# Patient Record
Sex: Female | Born: 1960
Health system: Southern US, Community
[De-identification: ages and names within clinical notes are randomized; demographics above are authoritative.]

## PROBLEM LIST (undated history)

## (undated) DIAGNOSIS — Z9109 Other allergy status, other than to drugs and biological substances: Secondary | ICD-10-CM

## (undated) DIAGNOSIS — M109 Gout, unspecified: Secondary | ICD-10-CM

## (undated) DIAGNOSIS — M1990 Unspecified osteoarthritis, unspecified site: Secondary | ICD-10-CM

## (undated) DIAGNOSIS — T7840XA Allergy, unspecified, initial encounter: Secondary | ICD-10-CM

## (undated) DIAGNOSIS — I00 Rheumatic fever without heart involvement: Secondary | ICD-10-CM

## (undated) DIAGNOSIS — R7303 Prediabetes: Secondary | ICD-10-CM

## (undated) HISTORY — DX: Other allergy status, other than to drugs and biological substances: Z91.09

## (undated) HISTORY — DX: Rheumatic fever without heart involvement: I00

## (undated) HISTORY — DX: Allergy, unspecified, initial encounter: T78.40XA

## (undated) HISTORY — PX: OVARIAN CYST REMOVAL: SHX89

## (undated) HISTORY — PX: POLYPECTOMY: SHX149

---

## 1983-02-08 HISTORY — PX: TUBAL LIGATION: SHX77

## 2007-06-07 ENCOUNTER — Emergency Department (HOSPITAL_COMMUNITY): Admission: EM | Admit: 2007-06-07 | Discharge: 2007-06-07 | Payer: Self-pay | Admitting: Family Medicine

## 2010-02-28 ENCOUNTER — Encounter: Payer: Self-pay | Admitting: Obstetrics and Gynecology

## 2011-02-10 ENCOUNTER — Ambulatory Visit: Payer: Commercial Managed Care - PPO | Admitting: Emergency Medicine

## 2011-02-10 ENCOUNTER — Encounter: Payer: Self-pay | Admitting: Emergency Medicine

## 2011-02-24 ENCOUNTER — Ambulatory Visit (INDEPENDENT_AMBULATORY_CARE_PROVIDER_SITE_OTHER): Payer: 59 | Admitting: Emergency Medicine

## 2011-02-24 ENCOUNTER — Encounter: Payer: Self-pay | Admitting: Emergency Medicine

## 2011-02-24 ENCOUNTER — Other Ambulatory Visit (HOSPITAL_COMMUNITY)
Admission: RE | Admit: 2011-02-24 | Discharge: 2011-02-24 | Disposition: A | Discharge status: Home / Self Care | Payer: 59 | Source: Ambulatory Visit | Attending: Family Medicine | Admitting: Family Medicine

## 2011-02-24 VITALS — BP 128/80 | HR 90 | Ht 70.0 in | Wt 251.7 lb

## 2011-02-24 DIAGNOSIS — A499 Bacterial infection, unspecified: Secondary | ICD-10-CM

## 2011-02-24 DIAGNOSIS — B9689 Other specified bacterial agents as the cause of diseases classified elsewhere: Secondary | ICD-10-CM | POA: Insufficient documentation

## 2011-02-24 DIAGNOSIS — Z0000 Encounter for general adult medical examination without abnormal findings: Secondary | ICD-10-CM

## 2011-02-24 DIAGNOSIS — N760 Acute vaginitis: Secondary | ICD-10-CM

## 2011-02-24 DIAGNOSIS — Z124 Encounter for screening for malignant neoplasm of cervix: Secondary | ICD-10-CM

## 2011-02-24 DIAGNOSIS — N898 Other specified noninflammatory disorders of vagina: Secondary | ICD-10-CM

## 2011-02-24 DIAGNOSIS — Z01419 Encounter for gynecological examination (general) (routine) without abnormal findings: Secondary | ICD-10-CM | POA: Insufficient documentation

## 2011-02-24 LAB — POCT WET PREP (WET MOUNT): Trichomonas Wet Prep HPF POC: NEGATIVE

## 2011-02-24 MED ORDER — METRONIDAZOLE 500 MG PO TABS: 500.0000 mg | ORAL_TABLET | Freq: Two times a day (BID) | ORAL | Status: AC

## 2011-02-24 NOTE — Assessment & Plan Note (Signed)
Pap today.  Information on scheduling mammogram and colonoscopy given.  Will check fasting lipids.

## 2011-02-24 NOTE — Progress Notes (Signed)
  Subjective:    Patient ID: Mary Burton, female    DOB: 02-Sep-1960, 51 y.o.   MRN: [id number]  HPI Mary Burton is here today to establish care and have a pap smear done.  She has no acute concerns.  Does describe some watery vaginal discharge and would like GC/Chlamydia screening.  Sexually active with one partner, no condom use.  LMP 4 months ago.  I have reviewed and updated the following as appropriate: allergies, current medications, past family history, past medical history, past social history, past surgical history and problem list    Review of Systems Negative except as in HPI.    Objective:   Physical Exam Vitals: reviewed Gen: overweight woman, pleasant, cooperative, NAD HEENT: sclera white, EOMI, PERRL, MMM, no pharyngeal erythema or exudate Neck: no LAD CV: RRR, no murmurs Pulm: CTAB, no wheezes, rales Abd: soft, non-tender, non-distended Pelvic: external genitalia normal; vaginal normal; cervix normal; small amount of white discharge present; bimanual exam normal, no cervical motion tenderness. Skin: no rashes or lesions      Assessment & Plan:

## 2011-02-24 NOTE — Patient Instructions (Signed)
It was very nice to meet you!  We collected some labs today, I will send you a letter with the results or call you if something is wrong.  I have also put in an order for a cholesterol test.  Please make a lab appointment for next week.  DO NOT EAT OR DRINK ANYTHING FOR 8 HOURS BEFORE THIS BLOOD DRAW.  Please set up a screening mammogram and colonoscopy in the next few months.  I will see you back in 1 year for your annual exam or sooner as needed.

## 2011-02-24 NOTE — Assessment & Plan Note (Signed)
Will give Flagyl 500mg  BID x7 days.

## 2011-02-24 NOTE — Assessment & Plan Note (Signed)
Will screen for GC/Chlamydia and do a wet prep per patient's request.

## 2011-02-25 LAB — GC/CHLAMYDIA PROBE AMP, GENITAL: Chlamydia, DNA Probe: NEGATIVE

## 2011-02-28 ENCOUNTER — Encounter: Payer: Self-pay | Admitting: Emergency Medicine

## 2011-03-03 ENCOUNTER — Other Ambulatory Visit: Payer: 59

## 2011-03-03 DIAGNOSIS — Z0000 Encounter for general adult medical examination without abnormal findings: Secondary | ICD-10-CM

## 2011-03-03 LAB — LIPID PANEL
LDL Cholesterol: 77 mg/dL (ref 0–99)
Triglycerides: 39 mg/dL (ref <150)
VLDL: 8 mg/dL (ref 0–40)

## 2011-03-03 NOTE — Progress Notes (Signed)
flp done today Mary Burton 

## 2011-03-08 ENCOUNTER — Encounter: Payer: Self-pay | Admitting: Emergency Medicine

## 2011-05-03 ENCOUNTER — Ambulatory Visit (INDEPENDENT_AMBULATORY_CARE_PROVIDER_SITE_OTHER): Payer: 59 | Admitting: Family Medicine

## 2011-05-03 ENCOUNTER — Encounter: Payer: Self-pay | Admitting: Family Medicine

## 2011-05-03 VITALS — BP 122/84 | HR 82 | Temp 98.3°F | Ht 72.0 in | Wt 242.0 lb

## 2011-05-03 DIAGNOSIS — M79673 Pain in unspecified foot: Secondary | ICD-10-CM | POA: Insufficient documentation

## 2011-05-03 DIAGNOSIS — M79609 Pain in unspecified limb: Secondary | ICD-10-CM

## 2011-05-03 NOTE — Progress Notes (Signed)
  Subjective:    Patient ID: Mary Burton, female    DOB: May 23, 1960, 51 y.o.   MRN: [id number]  HPI  3 weeks of feet swelling and pain  Located on both feet,Left greater than right.  left foot with pain over the top of metatarsals.  Worst when she is sitting with her legs down and notes it is also more noticeable when waking in the morning.  Not worse with walking.   Bought a new pair of shoes which seemed to make it worse.  Is on her feet a lot in her job.  No new physical activity or injury.    Review of Systems No leg or calf pain or swelling. No dyspnea, cough, PND.      Objective:   Physical Exam  GEN: Alert & Oriented, No acute distress CV:  Regular Rate & Rhythm, no murmur Respiratory:  Normal work of breathing, CTAB Ext: no pre-tibial edema, calf pain, or varicosities. Feet:  No edema noted.  Not painful on palpation today- no pain on dorsum of foot where she notes pain and swelling is located. Shoes too small.      Assessment & Plan:

## 2011-05-03 NOTE — Patient Instructions (Signed)
I think a good pair of shoes that are properly fitted will relieve your foot pain  See info for store to get fitted

## 2011-05-03 NOTE — Assessment & Plan Note (Signed)
New.  No evidence of swelling and history not suggestive of dependent edema or claudication.  Most likely dorsal foot pain with sensation of swelling due to poorly fitting shoes.  advised to go to running shoe store to get fitted for supportive shoes that fit properly.  If continues to have pain, would refer to sports medicine for further evaluation.

## 2011-05-31 ENCOUNTER — Ambulatory Visit: Payer: 59 | Admitting: Emergency Medicine

## 2011-06-06 ENCOUNTER — Encounter: Payer: Self-pay | Admitting: Emergency Medicine

## 2011-06-06 ENCOUNTER — Ambulatory Visit (INDEPENDENT_AMBULATORY_CARE_PROVIDER_SITE_OTHER): Payer: 59 | Admitting: Emergency Medicine

## 2011-06-06 VITALS — BP 127/90 | HR 96 | Ht 71.0 in | Wt 248.0 lb

## 2011-06-06 DIAGNOSIS — M25562 Pain in left knee: Secondary | ICD-10-CM

## 2011-06-06 DIAGNOSIS — M25561 Pain in right knee: Secondary | ICD-10-CM

## 2011-06-06 DIAGNOSIS — J309 Allergic rhinitis, unspecified: Secondary | ICD-10-CM

## 2011-06-06 DIAGNOSIS — M25569 Pain in unspecified knee: Secondary | ICD-10-CM

## 2011-06-06 DIAGNOSIS — J302 Other seasonal allergic rhinitis: Secondary | ICD-10-CM

## 2011-06-06 DIAGNOSIS — M7989 Other specified soft tissue disorders: Secondary | ICD-10-CM

## 2011-06-06 HISTORY — DX: Pain in right knee: M25.561

## 2011-06-06 MED ORDER — FLUTICASONE PROPIONATE 50 MCG/ACT NA SUSP: 2.0000 | Freq: Every day | NASAL | Status: DC

## 2011-06-06 NOTE — Assessment & Plan Note (Signed)
Likely a combination of increased salt intake and venous insufficiency.  Will check BMP to assess kidney function.  No evidence for renal or cardiac causes.  If creatinine okay, will give lasix 20mg  once daily as needed.  Discussed avoiding salty foods.  Also discussed weight loss to help with venous insufficiency.  If swelling persists or develops skin changes, she is to return.  Did discuss the possibility of needing compression stockings in the future.

## 2011-06-06 NOTE — Patient Instructions (Signed)
It was nice to see you!  For your feet... - this is likely caused by too much salt intake and venous insufficiency - we are going to check some labs - if the labs are okay, I will send some lasix to your pharmacy.  You can take this one a day as needed for swelling in your feet.  For you knees... - this is probably arthritis - you can try tylenol and motrin for pain - you can also try an arthritis pill with chondroitin and/or glucosamine  - weight loss with also help (cut out the potato chips and salty food and focus more on vegetables and fruits) - if your knee pain continues, we can try a joint injection  Come see me if your swelling gets worse (or no longer improves), any skin changes in your ankles, or your knee pain is getting worse.

## 2011-06-06 NOTE — Assessment & Plan Note (Signed)
Taking zyrtec at home.  Has a lot on nasal symptoms.  Been on flonase in the past with good results.  Will give trial of flonase.

## 2011-06-06 NOTE — Progress Notes (Signed)
  Subjective:    Patient ID: Mary Burton, female    DOB: 1960-03-06, 51 y.o.   MRN: [id number]  HPI Mary Burton is here for bilateral foot swelling.  1. Bilateral foot swelling: Going on for about 2 months.  Intermittent.  Worse with high salt intake.  Elevation helps some.  Associated with some dorsal foot pain.  Does have some increased urination.  No chest pain, palpitations, orthopnea, dyspnea.   2. Bilateral knee pain: R worse than L.  Ongoing.  Worse with activity.  Tylenol and motrin are not helping.  Asking about "arthritis" pills.   I have reviewed and updated the following as appropriate: allergies and current medications   Review of Systems See HPI    Objective:   Physical Exam BP 127/90  Pulse 96  Ht 5\' 11"  (1.803 m)  Wt 248 lb (112.492 kg)  BMI 34.59 kg/m2 Gen: alert, NAD HEENT: AT/[place], sclera white, MMM Neck: no JVD CV: RRR, no murmurs Ext: palpable DP pulses bilaterally, trace edema to mid-shin; bilaterally knees with TTP at anterior medial and lateral joint lines, no crepitus Skin: no skin changes of the ankles     Assessment & Plan:

## 2011-06-06 NOTE — Assessment & Plan Note (Signed)
Likely osteoarthritis.  Patient enthusiastic about trying to lose some weight.  Tylenol and motrin not helping.  Patient interested in trying an "arthritis" pill, instructed her to look for one with chondroitin and/or glucosamine.  Discussed knee injections, if medications/weight loss not helping.

## 2011-06-07 ENCOUNTER — Telehealth: Payer: Self-pay | Admitting: Emergency Medicine

## 2011-06-07 LAB — BASIC METABOLIC PANEL
Glucose, Bld: 94 mg/dL (ref 70–99)
Potassium: 3.9 mEq/L (ref 3.5–5.3)
Sodium: 138 mEq/L (ref 135–145)

## 2011-06-07 MED ORDER — FUROSEMIDE 20 MG PO TABS: 20.0000 mg | ORAL_TABLET | Freq: Every day | ORAL | Status: DC | PRN

## 2011-06-07 NOTE — Telephone Encounter (Signed)
Called and spoke with patient regarding lab results and lasix prescription (prn for pedal edema).

## 2011-10-07 ENCOUNTER — Ambulatory Visit (AMBULATORY_SURGERY_CENTER): Payer: 59

## 2011-10-07 VITALS — Ht 72.0 in | Wt 233.7 lb

## 2011-10-07 DIAGNOSIS — Z1211 Encounter for screening for malignant neoplasm of colon: Secondary | ICD-10-CM

## 2011-10-12 ENCOUNTER — Encounter: Payer: Self-pay | Admitting: Internal Medicine

## 2011-10-12 ENCOUNTER — Ambulatory Visit (AMBULATORY_SURGERY_CENTER): Payer: 59 | Admitting: Internal Medicine

## 2011-10-12 VITALS — BP 140/81 | HR 86 | Temp 98.2°F | Resp 18 | Ht 72.0 in | Wt 233.0 lb

## 2011-10-12 DIAGNOSIS — K635 Polyp of colon: Secondary | ICD-10-CM

## 2011-10-12 DIAGNOSIS — D126 Benign neoplasm of colon, unspecified: Secondary | ICD-10-CM

## 2011-10-12 DIAGNOSIS — Z800 Family history of malignant neoplasm of digestive organs: Secondary | ICD-10-CM

## 2011-10-12 DIAGNOSIS — Z1211 Encounter for screening for malignant neoplasm of colon: Secondary | ICD-10-CM

## 2011-10-12 HISTORY — PX: COLONOSCOPY: SHX174

## 2011-10-12 MED ORDER — SODIUM CHLORIDE 0.9 % IV SOLN: 500.0000 mL | INTRAVENOUS | Status: DC

## 2011-10-12 NOTE — Patient Instructions (Addendum)

## 2011-10-12 NOTE — Op Note (Signed)
[facility] [address] [place], [address]   COLONOSCOPY PROCEDURE REPORT  PATIENT: Mary, Burton  MR#: [id number] BIRTHDATE: 1960/02/21 , 51  yrs. old GENDER: Female ENDOSCOPIST: Beverley Fiedler, MD REFERRED XB:JYNW Elwyn Reach, MD PROCEDURE DATE:  10/12/2011 PROCEDURE:   Colonoscopy with cold biopsy polypectomy ASA CLASS:   Class I INDICATIONS:elevated risk screening, patient's immediate family history of colon cancer (mother), and first colonoscopy. MEDICATIONS: MAC sedation, administered by CRNA and Propofol (Diprivan) 230 mg IV  DESCRIPTION OF PROCEDURE:   After the risks benefits and alternatives of the procedure were thoroughly explained, informed consent was obtained.  A digital rectal exam revealed no rectal mass.   The LB [id number]  endoscope was introduced through the anus and advanced to the terminal ileum which was intubated for a short distance. No adverse events experienced.   The quality of the prep was Suprep good  The instrument was then slowly withdrawn as the colon was fully examined.   COLON FINDINGS: The mucosa appeared normal in the terminal ileum. Moderate melanosis was found throughout the entire examined colon. A sessile polyp measuring 4 mm in size was found in the transverse colon.  A polypectomy was performed with cold forceps.  The resection was complete and the polyp tissue was completely retrieved.  Retroflexed views revealed no abnormalities. The time to cecum=5 minutes 36 seconds.  Withdrawal time=11 minutes 16 seconds.  The scope was withdrawn and the procedure completed. COMPLICATIONS: There were no complications.  ENDOSCOPIC IMPRESSION: 1.   Normal mucosa in the terminal ileum 2.   Moderate melanosis was found throughout the entire examined colon 3.   Sessile polyp measuring 4 mm in size was found in the transverse colon; polypectomy was performed with cold forceps  RECOMMENDATIONS: 1.  await pathology  results 2.  Given your significant family history of colon cancer, you should have a repeat colonoscopy in 5 years 3.  You will receive a letter within 1-2 weeks with the results of your biopsy as well as final recommendations.  Please call my office if you have not received a letter after 3 weeks.   eSigned:  Beverley Fiedler, MD 10/12/2011 11:06 AMn revised  cc: Despina Hick MD and The Patient

## 2011-10-12 NOTE — Progress Notes (Signed)
Patient did not experience any of the following events: a burn prior to discharge; a fall within the facility; wrong site/side/patient/procedure/implant event; or a hospital transfer or hospital admission upon discharge from the facility. (G8907) Patient did not have preoperative order for IV antibiotic SSI prophylaxis. (G8918)  

## 2011-10-13 ENCOUNTER — Telehealth: Payer: Self-pay | Admitting: *Deleted

## 2011-10-13 NOTE — Telephone Encounter (Signed)
  Follow up Call-  Call back number 10/12/2011  Post procedure Call Back phone  # [phone]  Permission to leave phone message Yes     Left message.

## 2011-10-17 ENCOUNTER — Encounter: Payer: Self-pay | Admitting: Internal Medicine

## 2011-11-21 ENCOUNTER — Ambulatory Visit (INDEPENDENT_AMBULATORY_CARE_PROVIDER_SITE_OTHER): Payer: 59 | Admitting: Family Medicine

## 2011-11-21 ENCOUNTER — Encounter: Payer: Self-pay | Admitting: Family Medicine

## 2011-11-21 VITALS — BP 130/85 | HR 85 | Wt 236.6 lb

## 2011-11-21 DIAGNOSIS — M79672 Pain in left foot: Secondary | ICD-10-CM | POA: Insufficient documentation

## 2011-11-21 DIAGNOSIS — M79609 Pain in unspecified limb: Secondary | ICD-10-CM

## 2011-11-21 MED ORDER — DICLOFENAC SODIUM 1 % TD GEL: 4.0000 g | Freq: Four times a day (QID) | TRANSDERMAL | Status: DC

## 2011-11-21 NOTE — Progress Notes (Signed)
S: Pt comes in today for SDA for foot swelling x 2 months.  Has had problems with this off and on for multiple months.  Previously tried changing shoes, which didn't help (went to ortho place in [place]).  Lasix also did not help.  Swelling gets worse with increase salt intake.  Pain in feet, esp at night.  Feels like needles are sticking in her feet, no numbness.  Stands a lot during the day.  Elevating feet causes pain (throbbing), has not been able to successfully do this to see if it helps because of the pain.  Will take Aleve, which doesn't help. Has never tried compression stockings because they are too tight (tried a pair of her mom's).    Left foot is worse than right, but starting to have some swelling in right.  Precepted with Dr. Katrinka Blazing due to sports medicine nature of pain/exam   ROS: Per HPI  History  Smoking status  . Never Smoker   Smokeless tobacco  . Never Used    O:  Filed Vitals:   11/21/11 0858  BP: 130/85  Pulse: 85    Gen: NAD Ext: Warm, mild edema, L foot > R foot; no tenderness noted on right foot; + TTP over dorsal aspect of left foot, navicular and cuboid hypertrophy on left; supinates L foot with standing, pronates R foot   A/P: 51 y.o. female p/w dorsal foot swelling and tenderness on L -See problem list -f/u in [facility]

## 2011-11-21 NOTE — Assessment & Plan Note (Signed)
Precepted with Dr. Katrinka Blazing.  Will try arch support and voltaren gel. Send to [facility] for further evaluation, including possible U/S to r/o fracture.  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

## 2011-11-21 NOTE — Patient Instructions (Signed)
It was nice to meet you today.  I am sending in a prescription for a gel to put on your foot.  This should help with the pain.  I am also giving you an arch brace-- see if this helps at all.  If it causes more pain, you don't have to use it.  I want you to follow up at the Sports Medicine clinic in the next few weeks.  You can schedule that appt here or by calling them ([phone]).

## 2011-11-24 ENCOUNTER — Telehealth: Payer: Self-pay | Admitting: Emergency Medicine

## 2011-11-24 NOTE — Telephone Encounter (Signed)
Patient is calling because the problem with her feet is getting worse and she would like a recommendation for a Podiatrist.  Patient doesn't know if she would be able to get in faster if a referral came from [facility] or if she just needs to call a Podiatrist on her own.  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  She would like this to happen as soon as possible.

## 2011-11-24 NOTE — Telephone Encounter (Signed)
Called pt and she said, that she is frustrated because her feet are worse than ever. Medication was very expensive ($40) and did not help. She said, that she will schedule her own appointment. I told the pt that I am sorry, that we could not help her and I will let Dr.McGill know. Pt agreed. Mary Burton, Renato Battles

## 2011-11-24 NOTE — Telephone Encounter (Signed)
Pt is supposed to f/u at Sports Medicine per Dr. Katrinka Blazing whom I precepted to.

## 2012-05-08 ENCOUNTER — Telehealth: Payer: Self-pay | Admitting: Emergency Medicine

## 2012-05-08 NOTE — Telephone Encounter (Signed)
Returned call to patient.  Had diarrhea episode last week and it went away.  Symptoms returned last night with vomiting and diarrhea.  Works at [employer] and employees have similar symptom.  Discussed good hand hygiene, BRAT diet, and hydration with patient.  Patient will try home care measures first and call for appt tomorrow if not better.  Gaylene Brooks, RN

## 2012-05-08 NOTE — Telephone Encounter (Signed)
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

## 2012-05-31 ENCOUNTER — Ambulatory Visit: Payer: Self-pay

## 2012-05-31 ENCOUNTER — Ambulatory Visit (INDEPENDENT_AMBULATORY_CARE_PROVIDER_SITE_OTHER): Payer: 59 | Admitting: Family Medicine

## 2012-05-31 ENCOUNTER — Ambulatory Visit: Payer: 59

## 2012-05-31 DIAGNOSIS — M79609 Pain in unspecified limb: Secondary | ICD-10-CM

## 2012-05-31 DIAGNOSIS — M25473 Effusion, unspecified ankle: Secondary | ICD-10-CM

## 2012-05-31 DIAGNOSIS — M25475 Effusion, left foot: Secondary | ICD-10-CM

## 2012-05-31 MED ORDER — PREDNISONE 20 MG PO TABS: ORAL_TABLET | ORAL | Status: DC

## 2012-05-31 MED ORDER — TRAMADOL HCL 50 MG PO TABS: 50.0000 mg | ORAL_TABLET | Freq: Four times a day (QID) | ORAL | Status: DC | PRN

## 2012-05-31 NOTE — Progress Notes (Signed)
[address]   [place], [place]  [address]   [phone]  Subjective:    Patient ID: Mary Burton, female    DOB: Jul 03, 1960, 52 y.o.   MRN: [id number]  HPI This 52 y.o. female presents for evaluation of L foot knot.  Onset for quite a while; first visit at [facility] 04/2011.   S/p podiatry consult two months ago; s/p aspiration of fluid from dorsal aspect of foot; swelling returned.  Has been out of work due to pain, inability to walk.  Also underwent evaluation by [facility]; recommended buying new shoes in 04/2011; returned [facility]; unsure of etiology/diagnosis; prescribed foot cream rx (Voltaren gel); no improvement; returned for third visit; saw different provider; gave brace which was so painful; recommended evaluation in Sports Medicine Clinic but has not returned.  Foot is throbbing.  No referral to specialist.  Throbs like a tooth ache.  Podiatrist underwent xray; diagnosed with arthritis.  Nighttime awakening. Behavioral [facility].  +tingling in foot. Took out of work for one week without much improvement.   Feels like needles sticking in it.  Gets very stiff after prolonged sitting.  Certain foods make pain worse; [place] food, pizza makes pain worse.   No redness to skin; no warmth to joint.     Review of Systems  Constitutional: Negative for chills, diaphoresis and fatigue.  Musculoskeletal: Positive for joint swelling, arthralgias and gait problem.  Neurological: Positive for numbness. Negative for weakness.        Past Medical History  Diagnosis Date  . Environmental allergies   . Allergy     Past Surgical History  Procedure Laterality Date  . Tubal ligation  1985  . Ovarian cyst removal      Prior to Admission medications   Medication Sig Start Date End Date Taking? Authorizing Provider  diclofenac sodium (VOLTAREN) 1 % GEL Apply 4 g topically 4 (four) times daily. 11/21/11   Jacquelyn A McGill, MD  fluticasone  (FLONASE) 50 MCG/ACT nasal spray Place 2 sprays into the nose daily. 06/06/11 06/05/12  Phebe Colla, MD  furosemide (LASIX) 20 MG tablet Take 1 tablet (20 mg total) by mouth daily as needed. For foot swelling 06/07/11 06/06/12  Phebe Colla, MD    No Known Allergies  History   Social History  . Marital Status: Single    Spouse Name: N/A    Number of Children: N/A  . Years of Education: N/A   Occupational History  . Not on file.   Social History Main Topics  . Smoking status: Never Smoker   . Smokeless tobacco: Never Used  . Alcohol Use: No  . Drug Use: No  . Sexually Active: Yes    Birth Control/ Protection: None   Other Topics Concern  . Not on file   Social History Narrative  . No narrative on file    Family History  Problem Relation Age of Onset  . Cancer Mother     colon cancer  . Colon cancer Mother   . Hypertension Father     Objective:   Physical Exam  Nursing note and vitals reviewed. Constitutional: She is oriented to person, place, and time. She appears well-developed and well-nourished. No distress.  Cardiovascular: Intact distal pulses.   Pulses:      Dorsalis pedis pulses are 2+ on the right side, and 2+ on the left side.  Capillary refill< 3 seconds.  Musculoskeletal:  Left ankle: She exhibits normal range of motion, no swelling, no ecchymosis, no deformity and no laceration. No tenderness. No lateral malleolus and no medial malleolus tenderness found. Achilles tendon normal. Achilles tendon exhibits no pain, no defect and normal Thompson's test results.       Left foot: She exhibits decreased range of motion, tenderness, bony tenderness and swelling. She exhibits normal capillary refill, no crepitus, no deformity and no laceration.  L FOOT:  HYPERTROPHY OF DORSAL ASPECT OF FOOT; NO SWELLING; METATARSALS NON-TENDER.  NO WARMTH OR ERYTHEMA; PAIN WITH DORSIFLEXION OF ANKLE/FOOT.  Neurological: She is alert and oriented to person, place, and time.    Skin: She is not diaphoretic.    [facility] reading (PRIMARY) by  Dr. Katrinka Blazing. L FOOT:  +spurring scattered dorsal aspect of foot.        Assessment & Plan:  Pain, foot, left - Plan: DG Foot Complete Left, traMADol (ULTRAM) 50 MG tablet  Swelling of foot joint, left - Plan: DG Foot Complete Left, predniSONE (DELTASONE) 20 MG tablet, Ambulatory referral to Podiatry   1.  Pain L foot:  New.  Rx fro Prednisone, Ultram provided.   2.  Swelling Dorsal Aspect L foot:  New.  S/p podiatry consultation with aspiration; s/p family medicine appointment recently; onset one year ago.  Rx for Prednisone provided; rx for Ultram provided for pain; post-op shoe fitted for comfort; refer to podiatry.   3. OA L foot:  New. Likely etiology to current symptoms.  No suggestion of acute gouty attack.   Meds ordered this encounter  Medications  . predniSONE (DELTASONE) 20 MG tablet    Sig: Three tablets daily x 1 day, then two daily x 5 days then one daily x 5 days    Dispense:  18 tablet    Refill:  0  . traMADol (ULTRAM) 50 MG tablet    Sig: Take 1 tablet (50 mg total) by mouth every 6 (six) hours as needed for pain.    Dispense:  40 tablet    Refill:  0

## 2012-06-01 ENCOUNTER — Telehealth: Payer: Self-pay

## 2012-06-01 NOTE — Telephone Encounter (Signed)
Patient was in yesterday with complaints of foot pain. She is calling today asking for a prescription for her allergies. Claritin does not work for her. She would like something stronger. Advised her to call her PCP since we have not seen her for this complaint. She states that she would like to Dr. Katrinka Blazing to send the Rx since she has not been to any other dr in "a long while". Please advise.

## 2012-06-01 NOTE — Telephone Encounter (Signed)
Pt was seen yesterday by. Dr. Katrinka Blazing and she was wondering if she could be put on some Clartin for her allergies Call back number is [phone]

## 2012-06-04 NOTE — Telephone Encounter (Signed)
Thanks, have advised her to come in for this.

## 2012-06-04 NOTE — Telephone Encounter (Signed)
Call --- unfortunately, we did not discuss her allergy symptoms at all during her visit, thus, I am not able to treat her for allergies without a visit.  Looks like Despina Hick, MD has sent in rx for Flonase for her in 2013; would call office for refill.

## 2012-08-08 ENCOUNTER — Telehealth: Payer: Self-pay

## 2012-08-08 NOTE — Telephone Encounter (Signed)
DR.SMITH, PT WOULD LIKE FOR YOU TO CALL IN NAPROXEN FOR HER SHE STATES THAT THAT RX HELPED WITH HER FOOT ISSUE. BEST#[phone] PHARMACY: [facility]

## 2012-08-08 NOTE — Telephone Encounter (Signed)
Call --- I did not prescribe her Naproxen at her visit in 05/2012; I prescribed her Prednisone.  Who prescribed her Naproxen?

## 2012-08-10 NOTE — Telephone Encounter (Signed)
Tried to rtn pt call- unable to leave message.

## 2012-08-13 NOTE — Telephone Encounter (Signed)
Called patient. Advised if she does not call back, will disregard the message.

## 2012-08-14 ENCOUNTER — Telehealth: Payer: Self-pay

## 2012-08-14 NOTE — Telephone Encounter (Signed)
Pt is returning Amy's call regarding Naproxen, she states that she tried her fathers rx for naproxen and that helped her with the pain.  °Best# [phone] (work  ° ° °

## 2012-08-14 NOTE — Telephone Encounter (Signed)
Pt is returning Amy's call regarding Naproxen, she states that she tried her fathers rx for naproxen and that helped her with the pain.  Best# [phone] (work

## 2012-08-15 MED ORDER — NAPROXEN 500 MG PO TABS: 500.0000 mg | ORAL_TABLET | Freq: Two times a day (BID) | ORAL | Status: DC

## 2012-08-15 NOTE — Telephone Encounter (Signed)
You wanted to know who had given her the Naproxen (you treated with prednisone), see note below

## 2012-08-15 NOTE — Telephone Encounter (Signed)
I sent Naproxen rx to pharmacy.  Please advise patient.

## 2012-08-16 ENCOUNTER — Telehealth: Payer: Self-pay | Admitting: Radiology

## 2012-08-16 NOTE — Telephone Encounter (Signed)
Called at work to advise med sent in, patient never picked up, left message on home #. Mary Burton

## 2012-08-16 NOTE — Telephone Encounter (Signed)
Pt advised.

## 2012-12-05 ENCOUNTER — Telehealth: Payer: Self-pay

## 2012-12-05 NOTE — Telephone Encounter (Signed)
Will fwd to PCP.  Enaya Howze L, CMA  

## 2012-12-05 NOTE — Telephone Encounter (Signed)
Patient calls stating that she will need a referral to [facility] at [facility]. Misty Stanley, RN at [facility] examined patient's extremity swelling (been seen here 3 times for this reason) and is concerned about something wrong with patient's heart. Please call patient once referral has been placed.

## 2012-12-06 NOTE — Telephone Encounter (Signed)
She will need to schedule a visit with me prior to referral as I have never seen her before and it has been over 1 year since she has been seen in this office.

## 2012-12-06 NOTE — Telephone Encounter (Signed)
Instructed patient, see message below.  Tatym Schermer, Darlyne Russian, CMA

## 2012-12-11 ENCOUNTER — Ambulatory Visit (INDEPENDENT_AMBULATORY_CARE_PROVIDER_SITE_OTHER): Payer: 59 | Admitting: Family Medicine

## 2012-12-11 ENCOUNTER — Encounter: Payer: Self-pay | Admitting: Family Medicine

## 2012-12-11 VITALS — BP 128/78 | HR 72 | Ht 71.0 in | Wt 239.9 lb

## 2012-12-11 DIAGNOSIS — M7989 Other specified soft tissue disorders: Secondary | ICD-10-CM

## 2012-12-11 DIAGNOSIS — M79609 Pain in unspecified limb: Secondary | ICD-10-CM

## 2012-12-11 DIAGNOSIS — M79673 Pain in unspecified foot: Secondary | ICD-10-CM

## 2012-12-11 LAB — COMPLETE METABOLIC PANEL WITH GFR
AST: 14 U/L (ref 0–37)
Albumin: 4 g/dL (ref 3.5–5.2)
Alkaline Phosphatase: 52 U/L (ref 39–117)
BUN: 13 mg/dL (ref 6–23)
Calcium: 9.5 mg/dL (ref 8.4–10.5)
Chloride: 104 mEq/L (ref 96–112)
Potassium: 3.9 mEq/L (ref 3.5–5.3)
Sodium: 139 mEq/L (ref 135–145)
Total Protein: 7.4 g/dL (ref 6.0–8.3)

## 2012-12-11 NOTE — Patient Instructions (Signed)
Venous Stasis and Chronic Venous Insufficiency  As people age, the veins located in their legs may weaken and stretch. When veins weaken and lose the ability to pump blood effectively, the condition is called chronic venous insufficiency (CVI) or venous stasis. Almost all veins return blood back to the heart. This happens by:   The force of the heart pumping fresh blood pushes blood back to the heart.   Blood flowing to the heart from the force of gravity.  In the deep veins of the legs, blood has to fight gravity and flow upstream back to the heart. Here, the leg muscles contract to pump blood back toward the heart. Vein walls are elastic, and many veins have small valves that only allow blood to flow in one direction. When leg muscles contract, they push inward against the elastic vein walls. This squeezes blood upward, opens the valves, and moves blood toward the heart. When leg muscles relax, the vein wall also relaxes and the valves inside the vein close to prevent blood from flowing backward. This method of pumping blood out of the legs is called the venous pump.  CAUSES   The venous pump works best while walking and leg muscles are contracting. But when a person sits or stands, blood pressure in leg veins can build. Deep veins are usually able to withstand short periods of inactivity, but long periods of inactivity (and increased pressure) can stretch, weaken, and damage vein walls. High blood pressure can also stretch and damage vein walls. The veins may no longer be able to pump blood back to the heart. Venous hypertension (high blood pressure inside veins) that lasts over time is a primary cause of CVI. CVI can also be caused by:    Deep vein thrombosis, a condition where a thrombus (blood clot) blocks blood flow in a vein.   Phlebitis, an inflammation of a superficial vein that causes a blood clot to form.  Other risk factors for CVI may include:    Heredity.   Obesity.   Pregnancy.    Sedentary lifestyle.   Smoking.   Jobs requiring long periods of standing or sitting in one place.   Age and gender:   Women in their 40's and 50's and men in their 70's are more prone to developing CVI.  SYMPTOMS   Symptoms of CVI may include:    Varicose veins.   Ulceration or skin breakdown.   Lipodermatosclerosis, a condition that affects the skin just above the ankle, usually on the inside surface. Over time the skin becomes brown, smooth, tight and often painful. Those with this condition have a high risk of developing skin ulcers.   Reddened or discolored skin on the leg.   Swelling.  DIAGNOSIS   Your caregiver can diagnose CVI after performing a careful medical history and physical examination. To confirm the diagnosis, the following tests may also be ordered:    Duplex ultrasound.   Plethysmography (tests blood flow).   Venograms (x-ray using a special dye).  TREATMENT  The goals of treatment for CVI are to restore a person to an active life and to minimize pain or disability. Typically, CVI does not pose a serious threat to life or limb, and with proper treatment most people with this condition can continue to lead active lives. In most cases, mild CVI can be treated on an outpatient basis with simple procedures. Treatment methods include:    Elastic compression socks.   Sclerotherapy, a procedure involving an injection of   a material that "dissolves" the damaged veins. Other veins in the network of blood vessels take over the function of the damaged veins.   Vein stripping (an older procedure less commonly used).   Laser Ablation surgery.   Valve repair.  HOME CARE INSTRUCTIONS    Elastic compression socks must be worn every day. They can help with symptoms and lower the chances of the problem getting worse, but they do not cure the problem.   Only take over-the-counter or prescription medicines for pain, discomfort, or fever as directed by your caregiver.    Your caregiver will review your other medications with you.  SEEK MEDICAL CARE IF:    You are confused about how to take your medications.   There is redness, swelling, or increasing pain in the affected area.   There is a red streak or line that extends up or down from the affected area.   There is a breakdown or loss of skin in the affected area, even if the breakdown is small.   You develop an unexplained oral temperature above 102 F (38.9 C).   There is an injury to the affected area.  SEEK IMMEDIATE MEDICAL CARE IF:    There is an injury and open wound to the affected area.   Pain is not adequately relieved with pain medication prescribed or becomes severe.   An oral temperature above 102 F (38.9 C) develops.   The foot/ankle below the affected area becomes suddenly numb or the area feels weak and hard to move.  MAKE SURE YOU:    Understand these instructions.   Will watch your condition.   Will get help right away if you are not doing well or get worse.  Document Released: 05/30/2006 Document Revised: 04/18/2011 Document Reviewed: 08/07/2006  [employer] Patient Information 2014 [employer].

## 2012-12-12 ENCOUNTER — Telehealth: Payer: Self-pay | Admitting: Family Medicine

## 2012-12-12 NOTE — Telephone Encounter (Signed)
Discussed negative lab results.

## 2012-12-12 NOTE — Assessment & Plan Note (Signed)
Suspect venous insufficiency. Will check CMP to rule out liver/renal cause however previous BMP wnl. No stigmata of cardiac disease at this time.  -Check CMP/TSH -Compression stockings provided -Return in one month to monitor

## 2012-12-12 NOTE — Assessment & Plan Note (Signed)
Suspect that pain in due to lower extremity swelling. Will workup/treat suspected venous insufficiency and have patient return to office in one month for follow up.

## 2012-12-12 NOTE — Progress Notes (Signed)
  Subjective:    Patient ID: Mary Burton, female    DOB: 1960-03-14, 52 y.o.   MRN: [id number]  HPI 52 y/o female presents for evaluation of lower extremity swelling and foot pain, the swelling has been present for close to one year, she was seen in 2013 and diagnosed with venous insufficiency and told to decrease her salt intake, she was not prescribed compression stockings or diuretics at that time, she states that she has daily swelling, works as a Scientist, forensic, she is on her feet throughout the day, pain is worse when she is on her feet and at the end of the day, pain is described as diffusely over the plantar and dorsum of her feet, she is not currently on any medications for pain, no ulcerations on her feet   Review of Systems  Constitutional: Negative for fever, chills and fatigue.  Respiratory: Negative for cough and wheezing.   Cardiovascular: Negative for chest pain and palpitations.  Skin: Negative for color change and wound.  Neurological: Negative for weakness and numbness.   No PND, no orthopnea     Objective:   Physical Exam Vitals: Reviewed Gen: pleasant AAF, NAD Cardiac: RRR, S1and S2 present, no murmurs, no heaves/thrills, no JVD, able to lay flat on examination table without SOB Resp: CTAB, normal effort Abd: soft, nontender, bowel sounds present, no organomegally, no rebound, no guarding Ext: trace to 1+ edema of bilateral lower extremites up to mid shin, varicose veins present, 2+ dorsalis pedis and PT pulses present, feet mildly tender to palpation diffusely Skin: no foot ulcerations       Assessment & Plan:  Please see problem specific assessment and plan.

## 2012-12-17 ENCOUNTER — Ambulatory Visit: Payer: 59 | Admitting: Emergency Medicine

## 2013-02-28 ENCOUNTER — Encounter: Payer: Self-pay | Admitting: Emergency Medicine

## 2013-02-28 ENCOUNTER — Ambulatory Visit (INDEPENDENT_AMBULATORY_CARE_PROVIDER_SITE_OTHER): Payer: 59 | Admitting: Emergency Medicine

## 2013-02-28 VITALS — BP 133/90 | HR 83 | Ht 71.0 in | Wt 235.0 lb

## 2013-02-28 DIAGNOSIS — G5790 Unspecified mononeuropathy of unspecified lower limb: Secondary | ICD-10-CM

## 2013-02-28 DIAGNOSIS — G5792 Unspecified mononeuropathy of left lower limb: Secondary | ICD-10-CM

## 2013-02-28 LAB — POCT GLYCOSYLATED HEMOGLOBIN (HGB A1C): HEMOGLOBIN A1C: 5.8

## 2013-02-28 LAB — VITAMIN B12: VITAMIN B 12: 1253 pg/mL — AB (ref 211–911)

## 2013-02-28 LAB — FOLATE: Folate: 13.4 ng/mL

## 2013-02-28 MED ORDER — GABAPENTIN 300 MG PO CAPS: 300.0000 mg | ORAL_CAPSULE | Freq: Two times a day (BID) | ORAL | Status: DC

## 2013-02-28 NOTE — Patient Instructions (Signed)
It was nice to see you!  It looks like you have a neuropathy in your foot - but I'm not sure why.  We are checking some blood work today.  I will call you if anything is wrong, otherwise you will get a letter with the results in the mail in 1-2 weeks.  Start taking Neurontin. - Take 1 pill at bedtime for 2-3 days, then start taking in twice a day. - It may make you sleepy or feel a little out of it for the first few days - that typically improves after a couple of days.  Follow up in 2 weeks.

## 2013-02-28 NOTE — Assessment & Plan Note (Signed)
History very consistent with neuropathy. Unclear cause as no medical issues or injury. Will check A1c, B12, and folate. Neurontin 300mg  BID. F/u in 2 weeks.  If not improved, would likely refer for NCS.

## 2013-02-28 NOTE — Progress Notes (Signed)
   Subjective:    Patient ID: Mary Burton, female    DOB: 02-09-1960, 53 y.o.   MRN: [id number]  HPI Mary Burton is here for a SDA for left foot pain and swelling.  She reports that about 2 weeks ago she starting having a pins and needles sensation on the top of her left foot.  It started one day at work, out of the blue.  Denies any trauma or injury to the foot or ankle.  It makes it difficult to walk.  Also reports some swelling in the left foot - this is not new, but it seems to be a little worse in the last 2 weeks.  She has been wearing support hose for her chronic swelling with some improvement.  The discomfort makes it difficult for her to walk.  Sensation is present at rest as well.  Denies weakness of the foot.  States she took some prednisone (from her father) and that took care of the pain.  No current outpatient prescriptions on file prior to visit.   No current facility-administered medications on file prior to visit.    I have reviewed and updated the following as appropriate: allergies and current medications SHx: non smoker   Review of Systems See HPI    Objective:   Physical Exam BP 133/90  Pulse 83  Ht 5\' 11"  (1.803 m)  Wt 235 lb (106.595 kg)  BMI 32.79 kg/m2  LMP 05/22/2011 Gen: alert, cooperative, NAD - mild distress after exam; limps for a few steps before gait evens out Left foot: trace swelling over dorsum of foot; no erythema; 2+ DP pulse; palpation over lateral dorsal foot reproduces tingling; 5/5 strength in plantar-flexion, dorsi-flexion, inversion and eversion     Assessment & Plan:

## 2013-03-01 ENCOUNTER — Encounter: Payer: Self-pay | Admitting: Emergency Medicine

## 2013-03-06 ENCOUNTER — Telehealth: Payer: Self-pay | Admitting: Emergency Medicine

## 2013-03-06 NOTE — Telephone Encounter (Signed)
Pt is calling because the gabapentin is giving her headaches and making her dizzy. She wanted to know if she could get something else or should she take it after a different time. Please call. jw

## 2013-03-06 NOTE — Telephone Encounter (Signed)
LMVM for patient to please call and schedule F/U with Dr. Bridgett Larsson.  Patient was seen 02/28/2013 and MD requested pt. f/u in 2 weeks.  Mary Burton, Mary Burton, Mary Burton

## 2013-03-14 ENCOUNTER — Ambulatory Visit (INDEPENDENT_AMBULATORY_CARE_PROVIDER_SITE_OTHER): Payer: 59 | Admitting: Emergency Medicine

## 2013-03-14 VITALS — BP 155/84 | HR 87 | Temp 98.1°F | Wt 235.0 lb

## 2013-03-14 DIAGNOSIS — M79672 Pain in left foot: Secondary | ICD-10-CM

## 2013-03-14 DIAGNOSIS — R0789 Other chest pain: Secondary | ICD-10-CM

## 2013-03-14 DIAGNOSIS — M79609 Pain in unspecified limb: Secondary | ICD-10-CM

## 2013-03-14 MED ORDER — OMEPRAZOLE 20 MG PO CPDR: 20.0000 mg | DELAYED_RELEASE_CAPSULE | Freq: Every day | ORAL | Status: DC

## 2013-03-14 MED ORDER — NORTRIPTYLINE HCL 25 MG PO CAPS: 25.0000 mg | ORAL_CAPSULE | Freq: Every day | ORAL | Status: DC

## 2013-03-14 MED ORDER — CAPSAICIN 0.1 % EX CREA: 1.0000 "application " | TOPICAL_CREAM | Freq: Three times a day (TID) | CUTANEOUS | Status: DC

## 2013-03-14 NOTE — Assessment & Plan Note (Signed)
No red flags on history. Suspect anxiety or GERD.  Does report history of GERD. Start omeprazole. F/u in 2 weeks.

## 2013-03-14 NOTE — Assessment & Plan Note (Signed)
Unable to tolerate gabapentin. Will try nortriptyline 25mg  qHS and capsaisin cream. F/u in 2 weeks - if no improvement will get NCS.

## 2013-03-14 NOTE — Progress Notes (Signed)
   Subjective:    Patient ID: Mary Burton, female    DOB: May 20, 1960, 53 y.o.   MRN: [id number]  HPI Mary Burton is here for f/u of left foot pain.  Left foot pain Continues to have sharp, shooting pains in the left lateral foot.  She was unable to take the gabapentin due to dizziness.  Denies any changes in the pain.  Chest pain Reports about 1 week of intermittent central chest pains.  They are described as sharp and stabbing.  Last seconds to minutes.  Not associated with activity.  No associated shortness of breath, diaphoresis or dizziness.  She reports a history of GERD.  No personal or family history of cardiac problems.  No current outpatient prescriptions on file prior to visit.   No current facility-administered medications on file prior to visit.    I have reviewed and updated the following as appropriate: allergies and current medications SHx: never smoker   Review of Systems See HPI    Objective:   Physical Exam BP 155/84  Pulse 87  Temp(Src) 98.1 F (36.7 C)  Wt 235 lb (106.595 kg)  LMP 05/22/2011 Gen: alert, cooperative, NAD CV: RRR, no murmurs Chest: no chest wall tenderness Left foot: mild ankle swelling; no erythema or rashes     Assessment & Plan:

## 2013-03-14 NOTE — Patient Instructions (Signed)
It was nice to see you!  Take the nortriptyline at bedtime. Apply the cream 3 times a day. Take omeprazole in the morning before breakfast.  F/u with me in 2 weeks.

## 2013-03-19 ENCOUNTER — Telehealth: Payer: Self-pay | Admitting: Emergency Medicine

## 2013-03-19 DIAGNOSIS — M79672 Pain in left foot: Secondary | ICD-10-CM

## 2013-03-19 NOTE — Telephone Encounter (Signed)
Pt says medicine given on Feb 2 is not working Her feet feel like needles are sticking in then If you call back tomorrow, it has to be after 3 because she is on the hall Please advise

## 2013-03-19 NOTE — Telephone Encounter (Signed)
Per last OV (needs to f/up in 2 weeks) Will fwd. To PCP for review. Thanks. Javier Glazier, Gerrit Heck

## 2013-03-21 MED ORDER — NORTRIPTYLINE HCL 25 MG PO CAPS: 50.0000 mg | ORAL_CAPSULE | Freq: Every day | ORAL | Status: DC

## 2013-03-21 NOTE — Telephone Encounter (Signed)
I have ordered a nerve conduction study for the patient.  She should here from someone to schedule that in the next week or so.  In the meantime, she should increase the nortriptyline to 50mg  (2 tablets) at bedtime.  She should make an appointment to see me within the next week.

## 2013-03-21 NOTE — Telephone Encounter (Signed)
Called pt. No answer. Will try again later. I have scheduled the NCS at [facility] office [address]. Ph # [phone] Monday, Feb 16th at 2 pm. I also faxed pt's info to [place] office # [phone] .[place]

## 2013-03-22 NOTE — Telephone Encounter (Signed)
Called pt again @ work phone. No one known by this name. Mary Burton, [name] pt again and she reports that she has been informed already. Mary Burton, Gerrit Heck

## 2013-03-22 NOTE — Telephone Encounter (Signed)
Called pt again. LMVM to please return call. Re: appt was made.  Please see message. Thanks. Javier Glazier, Gerrit Heck

## 2013-04-10 ENCOUNTER — Telehealth: Payer: Self-pay | Admitting: Emergency Medicine

## 2013-04-10 NOTE — Telephone Encounter (Signed)
Pt would results from nerve conduction study. She would like some pain meds Please advise She would like for dr Bridgett Larsson to call her after 3:30 so she can talk to her

## 2013-04-11 MED ORDER — TRAMADOL HCL 50 MG PO TABS: 50.0000 mg | ORAL_TABLET | Freq: Three times a day (TID) | ORAL | Status: DC | PRN

## 2013-04-11 NOTE — Telephone Encounter (Signed)
Fwd to PCP

## 2013-04-11 NOTE — Telephone Encounter (Signed)
Called and spoke with patient.  Informed her of normal NCS/EMG.  She states the increased nortriptyline made her dizzy.  Will try tramadol 50mg  q8hr prn.  She is to schedule a f/u with me in the next few weeks.

## 2013-08-08 ENCOUNTER — Encounter: Payer: Self-pay | Admitting: Family Medicine

## 2013-08-08 ENCOUNTER — Ambulatory Visit (INDEPENDENT_AMBULATORY_CARE_PROVIDER_SITE_OTHER): Payer: 59 | Admitting: Family Medicine

## 2013-08-08 VITALS — BP 114/69 | HR 87 | Temp 97.9°F | Resp 18 | Wt 222.0 lb

## 2013-08-08 DIAGNOSIS — R300 Dysuria: Secondary | ICD-10-CM

## 2013-08-08 DIAGNOSIS — N390 Urinary tract infection, site not specified: Secondary | ICD-10-CM

## 2013-08-08 LAB — POCT UA - MICROSCOPIC ONLY

## 2013-08-08 LAB — POCT URINALYSIS DIPSTICK
Bilirubin, UA: NEGATIVE
GLUCOSE UA: NEGATIVE
Ketones, UA: NEGATIVE
NITRITE UA: NEGATIVE
Protein, UA: 100
Spec Grav, UA: 1.025
Urobilinogen, UA: 0.2
pH, UA: 7.5

## 2013-08-08 MED ORDER — CIPROFLOXACIN HCL 250 MG PO TABS: 250.0000 mg | ORAL_TABLET | Freq: Two times a day (BID) | ORAL | Status: DC

## 2013-08-08 NOTE — Patient Instructions (Signed)

## 2013-08-08 NOTE — Assessment & Plan Note (Signed)
Patient with symptoms of UTI and UA consistent with infection. Will treat with cipro 250 mg BID for 3 days. Culture to be sent. Given return precautions. F/u prn.

## 2013-08-08 NOTE — Progress Notes (Signed)
Patient ID: Mary Burton, female   DOB: Jun 09, 1960, 53 y.o.   MRN: [id number]  Tommi Rumps, MD Phone: [phone]  Mary Burton is a 53 y.o. female who presents today for same day visit.  Dysuria: starting yesterday with dysuria. Notes frequency and urgency. Has not gotten better. Has not taken anything for this issue. No fevers, nausea, vomiting, diarrhea, or abdominal or back pain.    Patient is a nonsmoker.   ROS: Per HPI   Physical Exam Filed Vitals:   08/08/13 1644  BP: 114/69  Pulse: 87  Temp: 97.9 F (36.6 C)  Resp: 18    Gen: Well NAD Abd: NABS, NT, ND Back: no cva tenderness, no back tenderness     Assessment/Plan: Please see individual problem list.  # Healthcare maintenance: not addressed

## 2013-12-06 ENCOUNTER — Ambulatory Visit (INDEPENDENT_AMBULATORY_CARE_PROVIDER_SITE_OTHER): Payer: 59 | Admitting: Family Medicine

## 2013-12-06 ENCOUNTER — Encounter: Payer: Self-pay | Admitting: Family Medicine

## 2013-12-06 VITALS — BP 129/75 | HR 95 | Temp 98.0°F | Ht 71.0 in | Wt 233.0 lb

## 2013-12-06 DIAGNOSIS — M7989 Other specified soft tissue disorders: Secondary | ICD-10-CM

## 2013-12-06 MED ORDER — HYDROCHLOROTHIAZIDE 12.5 MG PO CAPS: 12.5000 mg | ORAL_CAPSULE | Freq: Every day | ORAL | Status: DC

## 2013-12-06 NOTE — Assessment & Plan Note (Addendum)
Bl feet swelling exacerbated by diet, especially frozen foods Foot pain seems to accompany swelling Seems likel salt dependent fluid retention Satrt HCTZ at low dose,discussed low BP Follow up in 6-8 weeks

## 2013-12-06 NOTE — Progress Notes (Signed)
Patient ID: Mary Burton, female   DOB: 1960/04/01, 53 y.o.   MRN: [id number]   HPI  Patient presents today for feet selling and pain  Patient here with 3-4 years of BL feet swelling and pain> she states that her L one has bothered her the longest but they are both causing problems now.   She initially requests a referral to Dr. Emmaline Kluver at [facility] as he treated her friend's swelling very well. After discussion she is ok with me trying and then considering vascular referral if attempts are unsuccessful.   She state sthat he swelling is exacerbated by being on her feet and that it gets worse with any food, especially salty foods. She denies dyspnea, chest pain, or cardiac history.   She has tried orthotics and compression stockings without improvement.   Smoking status noted ROS: Per HPI  Objective: BP 129/75  Pulse 95  Temp(Src) 98 F (36.7 C) (Oral)  Ht 5\' 11"  (1.803 m)  Wt 233 lb (105.688 kg)  BMI 32.51 kg/m2  LMP 05/22/2011 Gen: NAD, alert, cooperative with exam HEENT: NCAT CV: RRR, good S1/S2, no murmur Resp: CTABL, no wheezes, non-labored Ext: No edema, warm, no tenderness with palpation of the Feet throughout BL Neuro: Alert and oriented, No gross deficits  Assessment and plan:  Bilateral swelling of feet Bl feet swelling exacerbated by diet, especially frozen foods Foot pain seems to accompany swelling Seems likel salt dependent fluid retention Satrt HCTZ at low dose,discussed low BP Follow up in 6-8 weeks     Meds ordered this encounter  Medications  . hydrochlorothiazide (MICROZIDE) 12.5 MG capsule    Sig: Take 1 capsule (12.5 mg total) by mouth daily.    Dispense:  30 capsule    Refill:  2

## 2014-02-13 ENCOUNTER — Ambulatory Visit: Payer: 59 | Admitting: Family Medicine

## 2014-04-02 ENCOUNTER — Ambulatory Visit (INDEPENDENT_AMBULATORY_CARE_PROVIDER_SITE_OTHER): Payer: 59 | Admitting: Family Medicine

## 2014-04-02 ENCOUNTER — Encounter: Payer: Self-pay | Admitting: Family Medicine

## 2014-04-02 VITALS — BP 131/82 | HR 92 | Temp 98.2°F | Ht 71.0 in | Wt 239.0 lb

## 2014-04-02 DIAGNOSIS — M79671 Pain in right foot: Secondary | ICD-10-CM | POA: Insufficient documentation

## 2014-04-02 DIAGNOSIS — M25562 Pain in left knee: Secondary | ICD-10-CM

## 2014-04-02 DIAGNOSIS — M25561 Pain in right knee: Secondary | ICD-10-CM

## 2014-04-02 DIAGNOSIS — M79672 Pain in left foot: Secondary | ICD-10-CM

## 2014-04-02 DIAGNOSIS — Z0000 Encounter for general adult medical examination without abnormal findings: Secondary | ICD-10-CM

## 2014-04-02 LAB — CBC WITH DIFFERENTIAL/PLATELET
Basophils Absolute: 0 10*3/uL (ref 0.0–0.1)
Basophils Relative: 0 % (ref 0–1)
EOS ABS: 0.1 10*3/uL (ref 0.0–0.7)
Eosinophils Relative: 2 % (ref 0–5)
HEMATOCRIT: 37.4 % (ref 36.0–46.0)
HEMOGLOBIN: 11.9 g/dL — AB (ref 12.0–15.0)
LYMPHS ABS: 1.5 10*3/uL (ref 0.7–4.0)
LYMPHS PCT: 32 % (ref 12–46)
MCH: 28.3 pg (ref 26.0–34.0)
MCHC: 31.8 g/dL (ref 30.0–36.0)
MCV: 88.8 fL (ref 78.0–100.0)
MPV: 11.1 fL (ref 8.6–12.4)
Monocytes Absolute: 0.4 10*3/uL (ref 0.1–1.0)
Monocytes Relative: 8 % (ref 3–12)
NEUTROS ABS: 2.7 10*3/uL (ref 1.7–7.7)
Neutrophils Relative %: 58 % (ref 43–77)
Platelets: 237 10*3/uL (ref 150–400)
RBC: 4.21 MIL/uL (ref 3.87–5.11)
RDW: 14.2 % (ref 11.5–15.5)
WBC: 4.7 10*3/uL (ref 4.0–10.5)

## 2014-04-02 LAB — COMPREHENSIVE METABOLIC PANEL
ALBUMIN: 3.9 g/dL (ref 3.5–5.2)
ALT: 16 U/L (ref 0–35)
AST: 15 U/L (ref 0–37)
Alkaline Phosphatase: 60 U/L (ref 39–117)
BILIRUBIN TOTAL: 0.3 mg/dL (ref 0.2–1.2)
BUN: 16 mg/dL (ref 6–23)
CO2: 26 mEq/L (ref 19–32)
Calcium: 9.1 mg/dL (ref 8.4–10.5)
Chloride: 102 mEq/L (ref 96–112)
Creat: 0.6 mg/dL (ref 0.50–1.10)
GLUCOSE: 84 mg/dL (ref 70–99)
POTASSIUM: 4.2 meq/L (ref 3.5–5.3)
Sodium: 136 mEq/L (ref 135–145)
Total Protein: 7.6 g/dL (ref 6.0–8.3)

## 2014-04-02 LAB — URIC ACID: Uric Acid, Serum: 4.3 mg/dL (ref 2.4–7.0)

## 2014-04-02 LAB — RHEUMATOID FACTOR: Rhuematoid fact SerPl-aCnc: <10 IU/mL (ref <=14)

## 2014-04-02 MED ORDER — COLCHICINE 0.6 MG PO TABS: 0.6000 mg | ORAL_TABLET | Freq: Every day | ORAL | Status: DC

## 2014-04-02 MED ORDER — PREGABALIN 75 MG PO CAPS: 75.0000 mg | ORAL_CAPSULE | Freq: Two times a day (BID) | ORAL | Status: DC

## 2014-04-02 NOTE — Patient Instructions (Signed)
Try the gout medicine, colchicine  Colchicine, take 2 pills right away, 1 pill 1 hour later, and then 1 pill a day. Call in for a follow up soon if it works so we can get you a long term medicine  If you aren't better in a week, try the lyrica  Lets see you regardless in 3-4 weeks.

## 2014-04-02 NOTE — Assessment & Plan Note (Signed)
Bilateral ankle pain worse on the left than the right Exam was squishy edema around the ankle joint, no erythema or warmth Considering her history of gout and very suspicious of gout versus pseudogout She states that that joint has been aspirated before but she is unsure of the results Trial of colchicine, if that's not improved will try to treat neuropathic pain again with Lyrica. She is felt gabapentin in the past. It turns out colchicine helps will start her on allopurinol Also checking for RA and other autoimmune sources

## 2014-04-02 NOTE — Progress Notes (Signed)
Patient ID: Mary Burton, female   DOB: 11/21/1960, 54 y.o.   MRN: [id number]   HPI  Patient presents today for follow-up leg edema and ankle pain.  Patient explains that she's had leg edema and foot pain for several years she's been gradually worsening. She states that her right ankle today is worse than the left. She describes it as a pins and needle type pain on the medial side of the right ankle that  Radiates around to the lateral ankle area.  She denies any recent travel, history of blood clot, recent surgery, or trauma.  She states that she does have a history of gout but that hurt much worse than this. She has had the ankle in question aspirated before but does not know the results of that aspiration study.  ROS: Per HPI  Objective: BP 131/82 mmHg  Pulse 92  Temp(Src) 98.2 F (36.8 C) (Oral)  Ht 5\' 11"  (1.803 m)  Wt 239 lb (108.41 kg)  BMI 33.35 kg/m2  LMP 05/22/2011 Gen: NAD, alert, cooperative with exam HEENT: NCAT CV: RRR, good S1/S2, no murmur Resp: CTABL, no wheezes, non-labored Ext: bilateral lower extremities with very little edema, trace pitting edema bilaterally, right ankle with soft puffy edema on the medial surface of the joint, slight tenderness to palpation, left ankle with similar edema, no gross deformity, calves nontender to palpation, symmetric in size, no palpable cords Neuro: Alert and oriented, No gross deficits  Assessment and plan:  Bilateral foot pain Bilateral ankle pain worse on the left than the right Exam was squishy edema around the ankle joint, no erythema or warmth Considering her history of gout and very suspicious of gout versus pseudogout She states that that joint has been aspirated before but she is unsure of the results Trial of colchicine, if that's not improved will try to treat neuropathic pain again with Lyrica. She is felt gabapentin in the past. It turns out colchicine helps will start her on allopurinol Also checking for  RA and other autoimmune sources   Healthcare maintenance Encouraged mammogram and Pap smear, she states that she has a GYN for that Checking some labs    Orders Placed This Encounter  Procedures  . ANA  . Rheumatoid factor  . Uric Acid  . Comprehensive metabolic panel  . CBC with Differential  . LDL Cholesterol, Direct    Meds ordered this encounter  Medications  . colchicine 0.6 MG tablet    Sig: Take 1 tablet (0.6 mg total) by mouth daily.    Dispense:  14 tablet    Refill:  0  . pregabalin (LYRICA) 75 MG capsule    Sig: Take 1 capsule (75 mg total) by mouth 2 (two) times daily.    Dispense:  60 capsule    Refill:  0

## 2014-04-02 NOTE — Assessment & Plan Note (Signed)
Encouraged mammogram and Pap smear, she states that she has a GYN for that Checking some labs

## 2014-04-03 LAB — ANTI-NUCLEAR AB-TITER (ANA TITER): ANA Titer 1: 1:40 {titer} — ABNORMAL HIGH

## 2014-04-03 LAB — ANA: Anti Nuclear Antibody(ANA): POSITIVE — AB

## 2014-04-08 ENCOUNTER — Telehealth: Payer: Self-pay | Admitting: Family Medicine

## 2014-04-08 NOTE — Telephone Encounter (Signed)
Called and discussed test results including low uric acid and weakly positive ANA which probably doesn't mean anything clinically significant. No other work up for ANA at this time.   She had no improvement with colchicine or lyrica. She has an herbal medicine which has resolved her symptoms but she cant remember the name.   She will bring it in next time she comes.   Laroy Apple, MD [place] Resident, PGY-3 04/08/2014, 12:18 PM

## 2014-05-30 ENCOUNTER — Ambulatory Visit (INDEPENDENT_AMBULATORY_CARE_PROVIDER_SITE_OTHER): Payer: 59 | Admitting: Family Medicine

## 2014-05-30 ENCOUNTER — Encounter: Payer: Self-pay | Admitting: Family Medicine

## 2014-05-30 VITALS — BP 129/64 | HR 82 | Temp 98.2°F | Wt 242.0 lb

## 2014-05-30 DIAGNOSIS — B86 Scabies: Secondary | ICD-10-CM | POA: Diagnosis not present

## 2014-05-30 MED ORDER — PERMETHRIN 5 % EX CREA: TOPICAL_CREAM | CUTANEOUS | Status: DC

## 2014-05-30 NOTE — Patient Instructions (Signed)

## 2014-05-30 NOTE — Progress Notes (Signed)
  Subjective:     Mary Burton is a 54 y.o. female who presents for evaluation of a rash involving the finger, forearm and upper extremity. Rash started 7 days ago. Lesions are erythematous/papules, and raised in texture. Rash has not changed over time. Rash is pruritic. Associated symptoms: none. Patient denies: abdominal pain, congestion, cough, decrease in energy level, myalgia, nausea, sore throat and vomiting. Patient has had contacts with similar rash. Patient deniesnew exposures (soaps, lotions, laundry detergents, foods, medications, plants, insects or animals).  Has had friend she stayed at with scabies vs bed bugs about one week ago   The following portions of the patient's history were reviewed and updated as appropriate: allergies, current medications, past family history, past medical history, past social history, past surgical history and problem list.  Review of Systems Pertinent items are noted in HPI.    Objective:    BP 129/64 mmHg  Pulse 82  Temp(Src) 98.2 F (36.8 C) (Oral)  Wt 242 lb (109.77 kg)  LMP 05/22/2011 General:  alert, cooperative and appears stated age  Skin:  papules noted on extremities.  + linear burrows and excoriations with erythematous papules B/L arms/hands      Assessment:    Scabies vs Bed bugs    Plan:    Follow up in 7 days. Start Permetherin x 1 dose.  Recommend washing clothing in hot water with bleach in case of bed bugs.  F/U w/ PCP

## 2014-07-09 ENCOUNTER — Ambulatory Visit: Payer: 59 | Admitting: Family Medicine

## 2014-09-10 ENCOUNTER — Ambulatory Visit: Payer: 59 | Admitting: Family Medicine

## 2014-09-19 ENCOUNTER — Telehealth: Payer: Self-pay | Admitting: Family Medicine

## 2014-09-19 NOTE — Telephone Encounter (Signed)
Pt needs a note for her job about her conditions of working, was told only her pcp could give her the note, pcp is booked up until September and pt cannot wait that long, wants to know if pcp would either write the note without being seen or give permission to see another doctor to get the note.

## 2014-09-19 NOTE — Telephone Encounter (Signed)
Pt returned call, scheduled her with Dr. Avon Gully and advised pt to come prepared to give specifics for job restrictions. Pt agreed.

## 2014-09-19 NOTE — Telephone Encounter (Signed)
I have not established with this patient. Last evaluated by former PCP Dr. Wendi Snipes 04/02/14 for bilateral foot pain/swelling. I do not see any description of work restrictions at that time.  From her report it sounds like her job modification requests would be long-term (as in >3-6 months). In this case, I would recommend that she be seen by any provider to write a specific letter as requested. She will need to provide many more specifics regarding job requirements and/or her specific limitation requests.  I was unable to reach her on my attempt to call her. Could you please re-attempt to contact her next week with this information?  Nobie Putnam, [place], PGY-3

## 2014-09-19 NOTE — Telephone Encounter (Signed)
Spoke with patient, she states she had been seeing former PCP for bilateral foot/knee pain and swelling and needs note for work stating that she cannot perform some of the physical requirements of her job due to foot and knee pain. Wants to know if new PCP can write this letter or if she can be seen by someone else to have this letter written. Will forward to PCP.

## 2014-09-26 ENCOUNTER — Encounter: Payer: Self-pay | Admitting: Internal Medicine

## 2014-09-26 ENCOUNTER — Ambulatory Visit (INDEPENDENT_AMBULATORY_CARE_PROVIDER_SITE_OTHER): Payer: 59 | Admitting: Internal Medicine

## 2014-09-26 ENCOUNTER — Encounter: Payer: Self-pay | Admitting: Family Medicine

## 2014-09-26 VITALS — BP 120/71 | HR 81 | Temp 98.2°F | Ht 71.0 in | Wt 233.9 lb

## 2014-09-26 DIAGNOSIS — M79671 Pain in right foot: Secondary | ICD-10-CM

## 2014-09-26 DIAGNOSIS — M25562 Pain in left knee: Secondary | ICD-10-CM

## 2014-09-26 DIAGNOSIS — M1990 Unspecified osteoarthritis, unspecified site: Secondary | ICD-10-CM

## 2014-09-26 DIAGNOSIS — M25561 Pain in right knee: Secondary | ICD-10-CM | POA: Diagnosis not present

## 2014-09-26 DIAGNOSIS — M79672 Pain in left foot: Secondary | ICD-10-CM

## 2014-09-26 MED ORDER — NAPROXEN 500 MG PO TABS: 500.0000 mg | ORAL_TABLET | Freq: Two times a day (BID) | ORAL | Status: DC

## 2014-09-26 NOTE — Assessment & Plan Note (Signed)
Most likely secondary to arthritis - Begin naproxen 500 mg q12 hr scheduled for three weeks, then q12 hr PRN - Will write note excusing patient from [employer] job duties - Follow-up in 3 months to reassess pain/medication regimen

## 2014-09-26 NOTE — Progress Notes (Signed)
   Subjective:    Patient ID: Mary Burton, female    DOB: [birth year], 54 y.o.   MRN: [id number]  HPI  Mary Burton presents for foot and knee swelling and pain. She reports that this problem has been going on for years and she has been seen for this repeatedly. She has been told in the past that she has arthritis. She has taken a variety of medications, but says that none of them have worked particularly well. She currently takes Tylenol every few days, and occasionally ibuprofen. The pain is worse towards the end of the day or when she has been on her feet a lot. The pain improves once she has been sitting down for a while or if her feet are elevated.   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 She says this is very difficult to do because of her knee and foot pain, and would like a note excusing her from that duty. She has received such notes in the past.   Review of Systems  Respiratory: Negative for shortness of breath.   Cardiovascular: Positive for leg swelling. Negative for chest pain.  Gastrointestinal: Negative for abdominal pain.  Musculoskeletal: Positive for myalgias (Legs), joint swelling (Knees and ankles) and gait problem (Difficulty walking due to foot pain).  Skin: Negative for color change and rash.       Objective:   Physical Exam  Constitutional: She is oriented to person, place, and time. She appears well-developed and well-nourished. No distress.  HENT:  Head: Normocephalic and atraumatic.  Cardiovascular: Normal rate, regular rhythm and normal heart sounds.   No murmur heard. Pulmonary/Chest: Effort normal and breath sounds normal. No respiratory distress. She has no wheezes.  Abdominal: Soft. Bowel sounds are normal. She exhibits no distension. There is no tenderness.  Musculoskeletal: Normal range of motion.  Knees and ankles symmetrical with no  redness or swelling. No tenderness to palpation of feet, ankles, or knees bilaterally. Trace-1+ pitting edema bilaterally to mid-shin. Non-tender, non-pitting edematous area near R medial malleolus.   Neurological: She is alert and oriented to person, place, and time. No cranial nerve deficit.  Skin: Skin is warm and dry.  Psychiatric: She has a normal mood and affect. Her behavior is normal.    BP 120/71 mmHg  Pulse 81  Temp(Src) 98.2 F (36.8 C) (Oral)  Ht 5\' 11"  (1.803 m)  Wt 233 lb 14.4 oz (106.096 kg)  BMI 32.64 kg/m2  LMP 05/22/2011     Assessment & Plan:   Mary Burton presents for foot and knee swelling and pain.   Foot and knee pain: most likely secondary to arthritis - Begin naproxen 500 mg q12 hr scheduled for three weeks, then q12 hr PRN - Will write note excusing patient from [employer] job duties - Follow-up in 3 months to reassess pain/medication regimen

## 2014-09-26 NOTE — Patient Instructions (Signed)
It was nice meeting you today. You were seen for knee and foot pain and swelling.   Please take naproxen two times a day (before work, then twelve hours later) every day for the first three weeks. After that, you can take the medication only as you need it.   Please schedule a follow-up appointment in three months to reevaluate your pain to see if we need to change your medications.   -Dr. Avon Gully

## 2015-10-03 ENCOUNTER — Ambulatory Visit (INDEPENDENT_AMBULATORY_CARE_PROVIDER_SITE_OTHER): Payer: 59 | Admitting: Family Medicine

## 2015-10-03 ENCOUNTER — Encounter: Payer: Self-pay | Admitting: Family Medicine

## 2015-10-03 ENCOUNTER — Ambulatory Visit (INDEPENDENT_AMBULATORY_CARE_PROVIDER_SITE_OTHER): Payer: 59

## 2015-10-03 VITALS — BP 128/80 | HR 86 | Temp 98.5°F | Resp 16 | Ht 70.0 in | Wt 242.0 lb

## 2015-10-03 DIAGNOSIS — M19011 Primary osteoarthritis, right shoulder: Secondary | ICD-10-CM | POA: Diagnosis not present

## 2015-10-03 DIAGNOSIS — M25511 Pain in right shoulder: Secondary | ICD-10-CM

## 2015-10-03 MED ORDER — MELOXICAM 15 MG PO TABS: 15.0000 mg | ORAL_TABLET | Freq: Every day | ORAL | 0 refills | Status: DC

## 2015-10-03 MED ORDER — TRAMADOL HCL 50 MG PO TABS: 50.0000 mg | ORAL_TABLET | Freq: Four times a day (QID) | ORAL | 0 refills | Status: DC | PRN

## 2015-10-03 MED ORDER — ACETAMINOPHEN 500 MG PO TABS
1000.0000 mg | ORAL_TABLET | Freq: Once | ORAL | Status: AC
  Administered 2015-10-03: 1000 mg via ORAL

## 2015-10-03 NOTE — Patient Instructions (Signed)
IF you received an x-ray today, you will receive an invoice from [facility]. Please contact [facility] at [phone] with questions or concerns regarding your invoice.   IF you received labwork today, you will receive an invoice from [employer]. Please contact [facility] at [phone] with questions or concerns regarding your invoice.   Our billing staff will not be able to assist you with questions regarding bills from these companies.  You will be contacted with the lab results as soon as they are available. The fastest way to get your results is to activate your My Chart account. Instructions are located on the last page of this paperwork. If you have not heard from [place] regarding the results in 2 weeks, please contact this office.      Generic Shoulder Exercises EXERCISES  RANGE OF MOTION (ROM) AND STRETCHING EXERCISES These exercises may help you when beginning to rehabilitate your injury. Your symptoms may resolve with or without further involvement from your physician, physical therapist or athletic trainer. While completing these exercises, remember:   Restoring tissue flexibility helps normal motion to return to the joints. This allows healthier, less painful movement and activity.  An effective stretch should be held for at least 30 seconds.  A stretch should never be painful. You should only feel a gentle lengthening or release in the stretched tissue. ROM - Pendulum  Bend at the waist so that your right / left arm falls away from your body. Support yourself with your opposite hand on a solid surface, such as a table or a countertop.  Your right / left arm should be perpendicular to the ground. If it is not perpendicular, you need to lean over farther. Relax the muscles in your right / left arm and shoulder as much as possible.  Gently sway your hips and trunk so they move your right / left arm without any use of your right  / left shoulder muscles.  Progress your movements so that your right / left arm moves side to side, then forward and backward, and finally, both clockwise and counterclockwise.  Complete __________ repetitions in each direction. Many people use this exercise to relieve discomfort in their shoulder as well as to gain range of motion. Repeat __________ times. Complete this exercise __________ times per day. STRETCH - Flexion, Standing  Stand with good posture. With an underhand grip on your right / left hand and an overhand grip on the opposite hand, grasp a broomstick or cane so that your hands are a little more than shoulder-width apart.  Keeping your right / left elbow straight and shoulder muscles relaxed, push the stick with your opposite hand to raise your right / left arm in front of your body and then overhead. Raise your arm until you feel a stretch in your right / left shoulder, but before you have increased shoulder pain.  Try to avoid shrugging your right / left shoulder as your arm rises by keeping your shoulder blade tucked down and toward your mid-back spine. Hold __________ seconds.  Slowly return to the starting position. Repeat __________ times. Complete this exercise __________ times per day. STRETCH - Internal Rotation  Place your right / left hand behind your back, palm-up.  Throw a towel or belt over your opposite shoulder. Grasp the towel/belt with your right / left hand.  While keeping an upright posture, gently pull up on the towel/belt until you feel a stretch in the front of your right /  left shoulder.  Avoid shrugging your right / left shoulder as your arm rises by keeping your shoulder blade tucked down and toward your mid-back spine.  Hold __________. Release the stretch by lowering your opposite hand. Repeat __________ times. Complete this exercise __________ times per day. STRETCH - External Rotation and Abduction  Stagger your stance through a doorframe.  It does not matter which foot is forward.  As instructed by your physician, physical therapist or athletic trainer, place your hands:  And forearms above your head and on the door frame.  And forearms at head-height and on the door frame.  At elbow-height and on the door frame.  Keeping your head and chest upright and your stomach muscles tight to prevent over-extending your low-back, slowly shift your weight onto your front foot until you feel a stretch across your chest and/or in the front of your shoulders.  Hold __________ seconds. Shift your weight to your back foot to release the stretch. Repeat __________ times. Complete this stretch __________ times per day.  STRENGTHENING EXERCISES  These exercises may help you when beginning to rehabilitate your injury. They may resolve your symptoms with or without further involvement from your physician, physical therapist or athletic trainer. While completing these exercises, remember:   Muscles can gain both the endurance and the strength needed for everyday activities through controlled exercises.  Complete these exercises as instructed by your physician, physical therapist or athletic trainer. Progress the resistance and repetitions only as guided.  You may experience muscle soreness or fatigue, but the pain or discomfort you are trying to eliminate should never worsen during these exercises. If this pain does worsen, stop and make certain you are following the directions exactly. If the pain is still present after adjustments, discontinue the exercise until you can discuss the trouble with your clinician.  If advised by your physician, during your recovery, avoid activity or exercises which involve actions that place your right / left hand or elbow above your head or behind your back or head. These positions stress the tissues which are trying to heal. STRENGTH - Scapular Depression and Adduction  With good posture, sit on a firm chair.  Supported your arms in front of you with pillows, arm rests or a table top. Have your elbows in line with the sides of your body.  Gently draw your shoulder blades down and toward your mid-back spine. Gradually increase the tension without tensing the muscles along the top of your shoulders and the back of your neck.  Hold for __________ seconds. Slowly release the tension and relax your muscles completely before completing the next repetition.  After you have practiced this exercise, remove the arm support and complete it in standing as well as sitting. Repeat __________ times. Complete this exercise __________ times per day.  STRENGTH - External Rotators  Secure a rubber exercise band/tubing to a fixed object so that it is at the same height as your right / left elbow when you are standing or sitting on a firm surface.  Stand or sit so that the secured exercise band/tubing is at your side that is not injured.  Bend your elbow 90 degrees. Place a folded towel or small pillow under your right / left arm so that your elbow is a few inches away from your side.  Keeping the tension on the exercise band/tubing, pull it away from your body, as if pivoting on your elbow. Be sure to keep your body steady so that the movement  is only coming from your shoulder rotating.  Hold __________ seconds. Release the tension in a controlled manner as you return to the starting position. Repeat __________ times. Complete this exercise __________ times per day.  STRENGTH - Supraspinatus  Stand or sit with good posture. Grasp a __________ weight or an exercise band/tubing so that your hand is "thumbs-up," like when you shake hands.  Slowly lift your right / left hand from your thigh into the air, traveling about 30 degrees from straight out at your side. Lift your hand to shoulder height or as far as you can without increasing any shoulder pain. Initially, many people do not lift their hands above shoulder  height.  Avoid shrugging your right / left shoulder as your arm rises by keeping your shoulder blade tucked down and toward your mid-back spine.  Hold for __________ seconds. Control the descent of your hand as you slowly return to your starting position. Repeat __________ times. Complete this exercise __________ times per day.  STRENGTH - Shoulder Extensors  Secure a rubber exercise band/tubing so that it is at the height of your shoulders when you are either standing or sitting on a firm arm-less chair.  With a thumbs-up grip, grasp an end of the band/tubing in each hand. Straighten your elbows and lift your hands straight in front of you at shoulder height. Step back away from the secured end of band/tubing until it becomes tense.  Squeezing your shoulder blades together, pull your hands down to the sides of your thighs. Do not allow your hands to go behind you.  Hold for __________ seconds. Slowly ease the tension on the band/tubing as you reverse the directions and return to the starting position. Repeat __________ times. Complete this exercise __________ times per day.  STRENGTH - Scapular Retractors  Secure a rubber exercise band/tubing so that it is at the height of your shoulders when you are either standing or sitting on a firm arm-less chair.  With a palm-down grip, grasp an end of the band/tubing in each hand. Straighten your elbows and lift your hands straight in front of you at shoulder height. Step back away from the secured end of band/tubing until it becomes tense.  Squeezing your shoulder blades together, draw your elbows back as you bend them. Keep your upper arm lifted away from your body throughout the exercise.  Hold __________ seconds. Slowly ease the tension on the band/tubing as you reverse the directions and return to the starting position. Repeat __________ times. Complete this exercise __________ times per day. STRENGTH - Scapular Depressors  Find a sturdy  chair without wheels, such as a from a dining room table.  Keeping your feet on the floor, lift your bottom from the seat and lock your elbows.  Keeping your elbows straight, allow gravity to pull your body weight down. Your shoulders will rise toward your ears.  Raise your body against gravity by drawing your shoulder blades down your back, shortening the distance between your shoulders and ears. Although your feet should always maintain contact with the floor, your feet should progressively support less body weight as you get stronger.  Hold __________ seconds. In a controlled and slow manner, lower your body weight to begin the next repetition. Repeat __________ times. Complete this exercise __________ times per day.    This information is not intended to replace advice given to you by your health care provider. Make sure you discuss any questions you have with your health care provider.  Document Released: 12/08/2004 Document Revised: 02/14/2014 Document Reviewed: 05/08/2008 Elsevier Interactive Patient Education [employer].

## 2015-10-03 NOTE — Progress Notes (Signed)
Subjective:  By signing my name below, I, Raven Small, attest that this documentation has been prepared under the direction and in the presence of Reginia Forts, MD.  Electronically Signed: Thea Alken, ED Scribe. 10/03/2015. 9:41 AM.   Patient ID: Mary Burton, female    DOB: Mar 17, 1960, 55 y.o.   MRN: [id number]  10/03/2015  Arm Pain (Right arm)  HPI  Mary Burton is a 55 y.o. female who presents to the [facility] complaining of right arm/shoulder pain that began 2 days ago. Was seen by PCP 8/19 at [facility]; presented there for knee swelling. Pt states she woke up 2 days ago with right shoulder pain. When she woke up this morning, she was unable to move right shoulder. She reports pain with lifting right arm. She has taken ibuprofen 800mg  and some of her father's medication, she is unable to recall the name. Last dose of ibuprofen was yesterday, at that time she took 4 tablets.  Pt is right hand dominant. She denies neck pain, numbness and tingling in right arm. She denies chance of pregnancy. LMP- was at age 47. Thinks she must have slept wrong. No history of R shoulder issues.  Pt works at the [employer], [employer] at [employer].   Review of Systems  Constitutional: Negative for chills and fever.  Musculoskeletal: Positive for arthralgias and myalgias. Negative for gait problem, neck pain and neck stiffness.  Skin: Negative for color change, rash and wound.  Neurological: Negative for weakness and numbness.    Past Medical History:  Diagnosis Date  . Allergy   . Environmental allergies    Past Surgical History:  Procedure Laterality Date  . OVARIAN CYST REMOVAL    . TUBAL LIGATION  1985   No Known Allergies  Social History   Social History  . Marital status: Single    Spouse name: N/A  . Number of children: N/A  . Years of education: N/A   Occupational History  . Not on file.   Social History Main Topics  . Smoking  status: Never Smoker  . Smokeless tobacco: Never Used  . Alcohol use No  . Drug use: No  . Sexual activity: Yes    Birth control/ protection: None   Other Topics Concern  . Not on file   Social History Narrative  . No narrative on file   Family History  Problem Relation Age of Onset  . Cancer Mother     colon cancer  . Colon cancer Mother   . Hypertension Father       Objective:    BP 128/80 (BP Location: Left Arm, Patient Position: Sitting, Cuff Size: Normal)   Pulse 86   Temp 98.5 F (36.9 C) (Oral)   Resp 16   Ht 5\' 10"  (1.778 m)   Wt 242 lb (109.8 kg)   LMP 05/22/2011   SpO2 98%   BMI 34.72 kg/m  Physical Exam  Constitutional: She is oriented to person, place, and time. She appears well-developed and well-nourished. No distress.  HENT:  Head: Normocephalic and atraumatic.  Eyes: Conjunctivae are normal. Pupils are equal, round, and reactive to light.  Neck: Normal range of motion. Neck supple.  Cardiovascular: Normal rate and regular rhythm.   Murmur heard.  Systolic murmur is present with a grade of 2/6  Pulmonary/Chest: Effort normal and breath sounds normal. She has no wheezes. She has no rales.  Musculoskeletal:  R SHOULDER:  +Tender to  palpation right AC region. No deltoid tenderness. Non tender to posterior shoulder.  Very limited ROM of R shoulder due to pain; unable to elevated above 30 degrees. +External rotation intact and without pain. +Pain with resistance with internal rotation. Cervical spine: non-tender midline; non-tender paraspinal regions B; full ROM cervical spine without limitation.  Grip 5/5.       Neurological: She is alert and oriented to person, place, and time. No cranial nerve deficit. She exhibits normal muscle tone. Coordination normal.  Skin: She is not diaphoretic.  Psychiatric: She has a normal mood and affect. Her behavior is normal.  Nursing note and vitals reviewed.   Dg Shoulder Right  Result Date: 10/03/2015 CLINICAL  DATA:  Right shoulder pain, limited range of motion for 2 days. No known injury. EXAM: RIGHT SHOULDER - 2+ VIEW COMPARISON:  None. FINDINGS: Degenerative changes in the [facility] joint. Glenohumeral joint is maintained. No acute bony abnormality. Specifically, no fracture, subluxation, or dislocation. Soft tissues are intact. IMPRESSION: No acute bony abnormality. Electronically Signed   By: Rolm Baptise M.D.   On: 10/03/2015 09:13    Assessment & Plan:   1. Pain in joint of right shoulder   2. Osteoarthritis of right shoulder, unspecified osteoarthritis type    -New. -Rx for Mobic 15mg  daily provided; stop Ibuprofen. -Rx for Tramadol provided. -Tylenol 1000mg  po provided in office. -call in two weeks if no improvement, will refer to sports medicine or ortho.   Orders Placed This Encounter  Procedures  . DG Shoulder Right    Standing Status:   Future    Number of Occurrences:   1    Standing Expiration Date:   10/02/2016    Order Specific Question:   Reason for Exam (SYMPTOM  OR DIAGNOSIS REQUIRED)    Answer:   R shoulder pain; no injury    Order Specific Question:   Is the patient pregnant?    Answer:   No    Order Specific Question:   Preferred imaging location?    Answer:   External   Meds ordered this encounter  Medications  . ibuprofen (ADVIL,MOTRIN) 200 MG tablet    Sig: Take 200 mg by mouth every 6 (six) hours as needed.  . meloxicam (MOBIC) 15 MG tablet    Sig: Take 1 tablet (15 mg total) by mouth daily.    Dispense:  30 tablet    Refill:  0  . traMADol (ULTRAM) 50 MG tablet    Sig: Take 1-2 tablets (50-100 mg total) by mouth every 6 (six) hours as needed.    Dispense:  40 tablet    Refill:  0  . acetaminophen (TYLENOL) tablet 1,000 mg    No Follow-up on file.   I personally performed the services described in this documentation, which was scribed in my presence. The recorded information has been reviewed and considered.  Chela Sutphen Elayne Guerin, M.D. [facility] [address] [place], [place]  [address] [phone] phone [phone] fax

## 2016-03-10 ENCOUNTER — Ambulatory Visit (INDEPENDENT_AMBULATORY_CARE_PROVIDER_SITE_OTHER): Payer: 59

## 2016-03-10 ENCOUNTER — Ambulatory Visit (INDEPENDENT_AMBULATORY_CARE_PROVIDER_SITE_OTHER): Payer: 59 | Admitting: Physician Assistant

## 2016-03-10 VITALS — BP 130/86 | HR 86 | Temp 98.2°F | Resp 16 | Ht 69.5 in | Wt 250.4 lb

## 2016-03-10 DIAGNOSIS — G8929 Other chronic pain: Secondary | ICD-10-CM

## 2016-03-10 DIAGNOSIS — M1711 Unilateral primary osteoarthritis, right knee: Secondary | ICD-10-CM | POA: Diagnosis not present

## 2016-03-10 DIAGNOSIS — M25562 Pain in left knee: Secondary | ICD-10-CM

## 2016-03-10 DIAGNOSIS — M25561 Pain in right knee: Secondary | ICD-10-CM

## 2016-03-10 DIAGNOSIS — M179 Osteoarthritis of knee, unspecified: Secondary | ICD-10-CM | POA: Diagnosis not present

## 2016-03-10 DIAGNOSIS — M1712 Unilateral primary osteoarthritis, left knee: Secondary | ICD-10-CM | POA: Diagnosis not present

## 2016-03-10 MED ORDER — TRAMADOL HCL 50 MG PO TABS: 50.0000 mg | ORAL_TABLET | Freq: Four times a day (QID) | ORAL | 0 refills | Status: DC | PRN

## 2016-03-10 MED FILL — traMADol HCL 50 MG TABS: 50 | 5 days supply | Qty: 40 | Fill #0

## 2016-03-10 NOTE — Patient Instructions (Addendum)
Ice the leg three times per day for 15 minutes. Please take add tylenol for additional pain, with food.   Arthritis Introduction Arthritis means joint pain. It can also mean joint disease. A joint is a place where bones come together. People who have arthritis may have:  Red joints.  Swollen joints.  Stiff joints.  Warm joints.  A fever.  A feeling of being sick. Follow these instructions at home: Pay attention to any changes in your symptoms. Take these actions to help with your pain and swelling. Medicines  Take over-the-counter and prescription medicines only as told by your doctor.  Do not take aspirin for pain if your doctor says that you may have gout. Activity  Rest your joint if your doctor tells you to.  Avoid activities that make the pain worse.  Exercise your joint regularly as told by your doctor. Try doing exercises like:  Swimming.  Water aerobics.  Biking.  Walking. Joint Care   If your joint is swollen, keep it raised (elevated) if told by your doctor.  If your joint feels stiff in the morning, try taking a warm shower.  If you have diabetes, do not apply heat without asking your doctor.  If told, apply heat to the joint:  Put a towel between the joint and the hot pack or heating pad.  Leave the heat on the area for 20-30 minutes.  If told, apply ice to the joint:  Put ice in a plastic bag.  Place a towel between your skin and the bag.  Leave the ice on for 20 minutes, 2-3 times per day.  Keep all follow-up visits as told by your doctor. Contact a doctor if:  The pain gets worse.  You have a fever. Get help right away if:  You have very bad pain in your joint.  You have swelling in your joint.  Your joint is red.  Many joints become painful and swollen.  You have very bad back pain.  Your leg is very weak.  You cannot control your pee (urine) or poop (stool). This information is not intended to replace advice given to  you by your health care provider. Make sure you discuss any questions you have with your health care provider. Document Released: 04/20/2009 Document Revised: 07/02/2015 Document Reviewed: 04/21/2014  2017 Elsevier     IF you received an x-ray today, you will receive an invoice from [facility]. Please contact [facility] at [phone] with questions or concerns regarding your invoice.   IF you received labwork today, you will receive an invoice from [place]. Please contact [facility] at [phone] with questions or concerns regarding your invoice.   Our billing staff will not be able to assist you with questions regarding bills from these companies.  You will be contacted with the lab results as soon as they are available. The fastest way to get your results is to activate your My Chart account. Instructions are located on the last page of this paperwork. If you have not heard from [place] regarding the results in 2 weeks, please contact this office.

## 2016-03-10 NOTE — Progress Notes (Signed)
[facility] [address], [place] [address] [phone]  Date:  03/10/2016   Name:  Mary Burton   DOB:  1961/02/01   MRN:  [id number]  PCP:  Adin Hector, MD    History of Present Illness:  Mary Burton is a 56 y.o. female patient who presents to [facility] for cc of knee pain.     --one week of bilateral knee pain.  It is difficult to get in and out of the car.  Hurts to walk.  She has noticed increased swelling.  Hurts all over her knee.  She has popping sensation.  The right knee hurts more than the left.  She has some instability, where she has to catch herself.  She has had knee effusion which was aspirated by an orthopedist, on [address].  She does not use a knee brace.  No redness to the area.  There is hx of gout, but it was in the foot.  No hx of trauma.  No hx of sports activity.   She was advised that there is cartilage deterioration in a knee or both.  Advised she may need knee replacement. Wt Readings from Last 3 Encounters:  03/10/16 250 lb 6.4 oz (113.6 kg)  10/03/15 242 lb (109.8 kg)  09/26/14 233 lb 14.4 oz (106.1 kg)    Patient Active Problem List   Diagnosis Date Noted  . Bilateral foot pain 04/02/2014  . Bilateral swelling of feet 06/06/2011  . Seasonal allergies 06/06/2011  . Bilateral knee pain 06/06/2011    Past Medical History:  Diagnosis Date  . Allergy   . Environmental allergies     Past Surgical History:  Procedure Laterality Date  . OVARIAN CYST REMOVAL    . TUBAL LIGATION  1985    Social History  Substance Use Topics  . Smoking status: Never Smoker  . Smokeless tobacco: Never Used  . Alcohol use No    Family History  Problem Relation Age of Onset  . Cancer Mother     colon cancer  . Colon cancer Mother   . Hypertension Father     No Known Allergies  Medication list has been reviewed and updated.  Current Outpatient Prescriptions on File Prior to Visit  Medication Sig Dispense Refill  .  ibuprofen (ADVIL,MOTRIN) 200 MG tablet Take 200 mg by mouth every 6 (six) hours as needed.    . meloxicam (MOBIC) 15 MG tablet Take 1 tablet (15 mg total) by mouth daily. (Patient not taking: Reported on 03/10/2016) 30 tablet 0  . traMADol (ULTRAM) 50 MG tablet Take 1-2 tablets (50-100 mg total) by mouth every 6 (six) hours as needed. (Patient not taking: Reported on 03/10/2016) 40 tablet 0   No current facility-administered medications on file prior to visit.     ROS ROS otherwise unremarkable unless listed above.   Physical Examination: BP 130/86 (BP Location: Right Arm, Cuff Size: Large)   Pulse 86   Temp 98.2 F (36.8 C) (Oral)   Resp 16   Ht 5' 9.5" (1.765 m)   Wt 250 lb 6.4 oz (113.6 kg)   LMP 07/07/2011   SpO2 98%   BMI 36.45 kg/m  Ideal Body Weight: Weight in (lb) to have BMI = 25: 171.4  Physical Exam  Constitutional: She is oriented to person, place, and time. She appears well-developed and well-nourished. No distress.  HENT:  Head: Normocephalic and atraumatic.  Right Ear: External ear normal.  Left  Ear: External ear normal.  Eyes: Conjunctivae and EOM are normal. Pupils are equal, round, and reactive to light.  Cardiovascular: Normal rate.   Pulmonary/Chest: Effort normal. No respiratory distress.  Musculoskeletal:       Right knee: She exhibits normal range of motion, no swelling, no ecchymosis, no erythema, no LCL laxity, normal patellar mobility, normal meniscus and no MCL laxity. No medial joint line, no lateral joint line, no MCL and no LCL tenderness noted.       Left knee: She exhibits normal range of motion, no swelling, no ecchymosis, no erythema, no LCL laxity, no bony tenderness, normal meniscus and no MCL laxity. No medial joint line, no lateral joint line, no MCL and no LCL tenderness noted.  Pain incited with burden of both right and left with full weight.    Neurological: She is alert and oriented to person, place, and time.  Skin: She is not diaphoretic.   Psychiatric: She has a normal mood and affect. Her behavior is normal.   Dg Knee 1-2 Views Left  Result Date: 03/10/2016 CLINICAL DATA:  Left knee pain and swelling EXAM: LEFT KNEE - 1-2 VIEW; LEFT KNEE - COMPLETE 4+ VIEW COMPARISON:  None. FINDINGS: No acute fracture dislocation. Generalized osteopenia. Mild medial femorotibial compartment joint space narrowing. Mild patellofemoral compartment osteoarthritis. Tricompartmental small marginal osteophytes. No significant joint effusion. IMPRESSION: 1.  No acute osseous injury of the left knee. 2. Mild osteoarthritis of the medial femorotibial compartment and patellofemoral compartment of the left knee. Electronically Signed   By: Kathreen Devoid   On: 03/10/2016 10:50   Dg Knee Complete 4 Views Left  Result Date: 03/10/2016 CLINICAL DATA:  Left knee pain and swelling EXAM: LEFT KNEE - 1-2 VIEW; LEFT KNEE - COMPLETE 4+ VIEW COMPARISON:  None. FINDINGS: No acute fracture dislocation. Generalized osteopenia. Mild medial femorotibial compartment joint space narrowing. Mild patellofemoral compartment osteoarthritis. Tricompartmental small marginal osteophytes. No significant joint effusion. IMPRESSION: 1.  No acute osseous injury of the left knee. 2. Mild osteoarthritis of the medial femorotibial compartment and patellofemoral compartment of the left knee. Electronically Signed   By: Kathreen Devoid   On: 03/10/2016 10:50   Dg Knee Complete 4 Views Right  Result Date: 03/10/2016 CLINICAL DATA:  Pain in both knees.  Mild swelling. EXAM: RIGHT KNEE - COMPLETE 4+ VIEW COMPARISON:  None. FINDINGS: No joint effusion. There is moderate try compartment joint space narrowing, marginal spur formation in sharpening of the tibial spines. No acute fractures or subluxations. IMPRESSION: 1. Tricompartment osteoarthritis. 2. No acute findings. Electronically Signed   By: Kerby Moors M.D.   On: 03/10/2016 10:15    Assessment and Plan: Mary Burton is a 56 y.o. female who  is here today for bilateral knee pain. --given braces.  Advised oa brace but were too small. --advised tylenol and tramadol.   --will refer back to orthopedist. Chronic pain of both knees - Plan: DG Knee Complete 4 Views Right, DG Knee Complete 4 Views Left, DG Knee 1-2 Views Left, AMB referral to orthopedics  Osteoarthritis of left knee, unspecified osteoarthritis type - Plan: DG Knee Complete 4 Views Left, traMADol (ULTRAM) 50 MG tablet, AMB referral to orthopedics  Osteoarthritis of right knee, unspecified osteoarthritis type - Plan: DG Knee Complete 4 Views Right, traMADol (ULTRAM) 50 MG tablet, AMB referral to orthopedics  Ivar Drape, PA-C [facility] 2/5/201810:04 AM

## 2016-03-28 ENCOUNTER — Encounter: Payer: Self-pay | Admitting: Family Medicine

## 2016-03-28 ENCOUNTER — Ambulatory Visit (INDEPENDENT_AMBULATORY_CARE_PROVIDER_SITE_OTHER): Payer: 59 | Admitting: Family Medicine

## 2016-03-28 VITALS — BP 124/82 | HR 92 | Temp 98.4°F | Ht 71.0 in | Wt 245.8 lb

## 2016-03-28 DIAGNOSIS — R0781 Pleurodynia: Secondary | ICD-10-CM

## 2016-03-28 MED ORDER — OXYCODONE-ACETAMINOPHEN 5-325 MG PO TABS: 1.0000 | ORAL_TABLET | Freq: Four times a day (QID) | ORAL | 0 refills | Status: DC | PRN

## 2016-03-28 MED FILL — OXYCODONE W/APAP 5/325 TAB: 5-325 | 3 days supply | Qty: 12 | Fill #0

## 2016-03-28 NOTE — Patient Instructions (Signed)
Thank you so much for coming to visit today! We will obtain xrays of your ribs. Please go to [facility] to have this done. I have given you three days of Percocet. Please only use this as needed--otherwise use Aleve or Ibuprofen. Please avoid further doses of Tylenol when using Percocet since this medication is already in it. Please return if symptoms worsen or do not improve over the next 2-3 weeks.  Dr. Gerlean Ren

## 2016-03-29 NOTE — Progress Notes (Signed)
Subjective:     Patient ID: Mary Burton, female   DOB: January 14, 1961, 56 y.o.   MRN: [id number]  HPI [name] is a 56yo female presenting today for right rib pain. [place] over her father's lift two weeks ago with part of lift hitting her on her right side. Notes pain from under right breast around to back. Has tried heat, ice, motrin, aleve, ibuprofen, and tramadol without relief. States pain is severe and is limiting her ADLs and ability to work. Also difficult to sleep due to pain. Denies shortness of breath, palpitations, chest pain. Denies rash and fever. Symptoms have not improved at all over the last two weeks. Nonsmoker.  Review of Systems Per HPI    Objective:   Physical Exam  Constitutional: She appears well-developed and well-nourished. No distress.  HENT:  Head: Normocephalic and atraumatic.  Cardiovascular: Normal rate and regular rhythm.   No murmur heard. Pulmonary/Chest: Effort normal. No respiratory distress. She has no wheezes.  Abdominal: Soft. She exhibits no distension. There is no tenderness.  Negative Murphy's (initially seemed positive, however pain is coming from higher and when done slowly she denies pain under ribcage)  Musculoskeletal:  Significant tenderness over lower right ribs under breast, anterior greater than posterior ribs however mild tenderness extends along ribs to spinal insertion.  Skin: No rash noted.  Psychiatric: She has a normal mood and affect. Her behavior is normal.      Assessment and Plan:     1. Rib pain Given significant pain without improvement, will obtain xray of right ribs. No improvement with heat/ice, NSAIDs, or Tramadol. Offered shot of Toradol, but patient does not wish to have injection at this time. [place] [employer] relieved without red flags. Given significant pain with affect on ADLs, will give prescription for Percocet for 3 days. Return if symptoms worsen or fail to resolve.

## 2016-07-19 ENCOUNTER — Encounter: Payer: Self-pay | Admitting: *Deleted

## 2016-08-14 ENCOUNTER — Encounter (HOSPITAL_COMMUNITY): Payer: Self-pay | Admitting: Emergency Medicine

## 2016-08-14 ENCOUNTER — Ambulatory Visit (HOSPITAL_COMMUNITY)
Admission: EM | Admit: 2016-08-14 | Discharge: 2016-08-14 | Disposition: A | Discharge status: Home / Self Care | Payer: 59 | Attending: Family Medicine | Admitting: Family Medicine

## 2016-08-14 ENCOUNTER — Ambulatory Visit (INDEPENDENT_AMBULATORY_CARE_PROVIDER_SITE_OTHER): Payer: 59

## 2016-08-14 DIAGNOSIS — S6992XA Unspecified injury of left wrist, hand and finger(s), initial encounter: Secondary | ICD-10-CM | POA: Diagnosis not present

## 2016-08-14 DIAGNOSIS — S62525A Nondisplaced fracture of distal phalanx of left thumb, initial encounter for closed fracture: Secondary | ICD-10-CM

## 2016-08-14 DIAGNOSIS — W231XXA Caught, crushed, jammed, or pinched between stationary objects, initial encounter: Secondary | ICD-10-CM

## 2016-08-14 NOTE — ED Notes (Signed)
Ortho tech remains at bedside

## 2016-08-14 NOTE — Progress Notes (Signed)
Orthopedic Tech Progress Note Patient Details:  Mary Burton Dec 17, 1960 [id number]  Ortho Devices Type of Ortho Device: Ace wrap, Thumb spica splint Splint Material: Fiberglass Ortho Device/Splint Location: LUE Ortho Device/Splint Interventions: Ordered, Application   Braulio Bosch 08/14/2016, 8:10 PM

## 2016-08-14 NOTE — ED Triage Notes (Signed)
The patient presented to the [facility] with a complaint of left hand pain secondary to a fall that occurred earlier today.

## 2016-08-15 ENCOUNTER — Telehealth (HOSPITAL_COMMUNITY): Payer: Self-pay | Admitting: Emergency Medicine

## 2016-08-15 NOTE — Telephone Encounter (Signed)
Pt called stating she was seen here at the [facility] yest (08/14/16) for a fx of thumb  Sts that the splint placed here has made the pain worse and it is throbbing.   Sts she has appt w/Ortho on 08/17/16 at 1430.   Wants to know if we can call in something stronger than naproxen or ibuprofen to her pharmacy ([facility])  Dr. Mannie Stabile not here but spoke w/Dr. Valere Dross and she suggested pt to come in to re-assess the splint b/c the splint should not make the pain worse.   Pt sts it doesn't make sense for her to come in wait and pay another copay.... Sts she will just wait to see thr orthopedic Wednesday.

## 2016-08-18 ENCOUNTER — Ambulatory Visit (INDEPENDENT_AMBULATORY_CARE_PROVIDER_SITE_OTHER): Payer: 59 | Admitting: Family

## 2016-08-18 ENCOUNTER — Encounter (INDEPENDENT_AMBULATORY_CARE_PROVIDER_SITE_OTHER): Payer: Self-pay | Admitting: Family

## 2016-08-18 VITALS — Ht 71.0 in | Wt 245.0 lb

## 2016-08-18 DIAGNOSIS — S62525A Nondisplaced fracture of distal phalanx of left thumb, initial encounter for closed fracture: Secondary | ICD-10-CM | POA: Diagnosis not present

## 2016-08-18 NOTE — ED Provider Notes (Signed)
  [facility]   [phone] 08/14/16 Arrival Time: 80  ASSESSMENT & PLAN:  Today you were diagnosed with the following: 1. Nondisplaced fracture of distal phalanx of left thumb, initial encounter for closed fracture    A splint has been placed for your thumb fracture. As discussed, please call for an orthopaedic follow up appointment within the next few days. Keep splint dry. You may use over the counter ibuprofen or acetaminophen as needed.   Reviewed expectations re: course of current medical issues. Questions answered. Outlined signs and symptoms indicating need for more acute intervention. Patient verbalized understanding. After Visit Summary given.   SUBJECTIVE:  Mary Burton is a 56 y.o. female who presents with complaint of L thumb injury today. Got caught in her father's wheelchair. Describes crush injury. Swelling and some bruising. No sensation changes. "Stiff". No previous injury to L thumb. Pain worse with thumb movement. No self-treatment.  ROS: As per HPI.   OBJECTIVE:  Vitals:   08/14/16 1832  BP: (!) 143/80  Pulse: 72  Resp: 18  Temp: 97.9 F (36.6 C)  TempSrc: Oral  SpO2: 98%     General appearance: alert; no distress Extremities: L thumb with distal swelling and bruising; nail intact; capillary refill normal; tender to touch; no subungual hematoma Skin: warm and dry Neurologic: sensation intact L thumb Psychological:  alert and cooperative; normal mood and affect  Imaging:  Dg Finger Thumb Left  Result Date: 08/14/2016 CLINICAL DATA:  Thumb injury EXAM: LEFT THUMB 2+V COMPARISON:  None. FINDINGS: Nondisplaced but slightly comminuted fracture involving the base of the first distal phalanx, articular extension to the PIP joint on the lateral view. No subluxation. No radiopaque foreign body. IMPRESSION: Comminuted but nondisplaced intra-articular fracture involving the base of the first distal phalanx Electronically Signed   By: Donavan Foil M.D.   On: 08/14/2016 19:31    No Known Allergies  PMHx, SurgHx, SocialHx, Medications, and Allergies were reviewed in the Visit Navigator and updated as appropriate.      Vanessa Kick, MD 08/18/16 [phone]

## 2016-08-18 NOTE — Progress Notes (Signed)
   Office Visit Note   Patient: Mary Burton           Date of Birth: [birth year]           MRN: [id number] Visit Date: 08/18/2016              Requested by: Verner Mould, MD [address] [place], [place] [address] PCP: Verner Mould, MD  Chief Complaint  Patient presents with  . Left Thumb - Follow-up      HPI: The patient is a 56 year old woman who presents today for initial evaluation of a left thumb injury. She states that about a week ago she got her thumb caught in her father's wheelchair in twisted and injured thumb. Regress which were performed emergency department were revealing for a nondisplaced intra-articular fracture of the distal phalanx.   Assessment & Plan: Visit Diagnoses:  1. Nondisplaced fracture of distal phalanx of left thumb, initial encounter for closed fracture     Plan: We'll place her in a thumb splint. She'll follow-up in 2 weeks for clinical recheck. States she would like her knees evaluated at that time. Is interested in steroid injections as well.  Follow-Up Instructions: Return in about 2 weeks (around 09/01/2016).   Left Hand Exam   Tenderness  Left hand tenderness location: distal thumb.   Range of Motion  The patient has normal left wrist ROM.  Muscle Strength  Grip:  5/5   Other  Erythema: absent Sensation: normal      Patient is alert, oriented, no adenopathy, well-dressed, normal affect, normal respiratory effort.   Imaging: No results found.  Labs: Lab Results  Component Value Date   HGBA1C 5.8 02/28/2013   LABURIC 4.3 04/02/2014    Orders:  No orders of the defined types were placed in this encounter.  No orders of the defined types were placed in this encounter.    Procedures: No procedures performed  Clinical Data: No additional findings.  ROS:  All other systems negative, except as noted in the HPI. Review of Systems  Musculoskeletal: Positive for arthralgias.  Skin:  Positive for color change.    Objective: Vital Signs: Ht 5\' 11"  (1.803 m)   Wt 245 lb (111.1 kg)   LMP 07/07/2011   BMI 34.17 kg/m   Specialty Comments:  No specialty comments available.  PMFS History: Patient Active Problem List   Diagnosis Date Noted  . Bilateral foot pain 04/02/2014  . Bilateral swelling of feet 06/06/2011  . Seasonal allergies 06/06/2011  . Bilateral knee pain 06/06/2011   Past Medical History:  Diagnosis Date  . Allergy   . Environmental allergies     Family History  Problem Relation Age of Onset  . Colon cancer Mother   . Hypertension Father     Past Surgical History:  Procedure Laterality Date  . OVARIAN CYST REMOVAL    . TUBAL LIGATION  1985   Social History   Occupational History  . Not on file.   Social History Main Topics  . Smoking status: Never Smoker  . Smokeless tobacco: Never Used  . Alcohol use No  . Drug use: No  . Sexual activity: Yes    Birth control/ protection: None

## 2016-11-23 ENCOUNTER — Encounter: Payer: Self-pay | Admitting: Internal Medicine

## 2017-03-13 DIAGNOSIS — R5383 Other fatigue: Secondary | ICD-10-CM | POA: Diagnosis not present

## 2017-03-13 DIAGNOSIS — R0602 Shortness of breath: Secondary | ICD-10-CM | POA: Diagnosis not present

## 2017-03-13 DIAGNOSIS — R635 Abnormal weight gain: Secondary | ICD-10-CM | POA: Diagnosis not present

## 2017-03-13 MED FILL — PHENTERMINE 37.5 MG TABLET: 37.5 | 30 days supply | Qty: 30 | Fill #0

## 2017-04-14 DIAGNOSIS — R635 Abnormal weight gain: Secondary | ICD-10-CM | POA: Diagnosis not present

## 2017-04-14 DIAGNOSIS — Z0279 Encounter for issue of other medical certificate: Secondary | ICD-10-CM

## 2017-04-14 DIAGNOSIS — N946 Dysmenorrhea, unspecified: Secondary | ICD-10-CM | POA: Diagnosis not present

## 2017-04-14 MED FILL — PHENTERMINE 37.5 MG TABLET: 37.5 | 30 days supply | Qty: 30 | Fill #0

## 2017-04-17 DIAGNOSIS — R5383 Other fatigue: Secondary | ICD-10-CM | POA: Diagnosis not present

## 2017-04-17 DIAGNOSIS — R0602 Shortness of breath: Secondary | ICD-10-CM | POA: Diagnosis not present

## 2017-04-17 DIAGNOSIS — E559 Vitamin D deficiency, unspecified: Secondary | ICD-10-CM | POA: Diagnosis not present

## 2017-04-17 DIAGNOSIS — Z114 Encounter for screening for human immunodeficiency virus [HIV]: Secondary | ICD-10-CM | POA: Diagnosis not present

## 2017-04-17 DIAGNOSIS — Z0000 Encounter for general adult medical examination without abnormal findings: Secondary | ICD-10-CM | POA: Diagnosis not present

## 2017-04-26 DIAGNOSIS — R9431 Abnormal electrocardiogram [ECG] [EKG]: Secondary | ICD-10-CM | POA: Diagnosis not present

## 2017-05-19 DIAGNOSIS — E559 Vitamin D deficiency, unspecified: Secondary | ICD-10-CM | POA: Diagnosis not present

## 2017-05-19 DIAGNOSIS — R635 Abnormal weight gain: Secondary | ICD-10-CM | POA: Diagnosis not present

## 2017-05-19 DIAGNOSIS — R5383 Other fatigue: Secondary | ICD-10-CM | POA: Diagnosis not present

## 2017-05-19 MED FILL — PHENTERMINE 37.5 MG TABLET: 37.5 | 30 days supply | Qty: 30 | Fill #0

## 2017-05-19 MED FILL — VIT D2 1.25 MG (50,000 UNIT: 1.25 MG | 84 days supply | Qty: 12 | Fill #0

## 2017-07-03 ENCOUNTER — Encounter: Payer: Self-pay | Admitting: Family Medicine

## 2017-08-04 DIAGNOSIS — R635 Abnormal weight gain: Secondary | ICD-10-CM | POA: Diagnosis not present

## 2017-08-07 MED FILL — PHENTERMINE 37.5 MG TABLET: 37.5 | 30 days supply | Qty: 30 | Fill #0

## 2017-09-07 DIAGNOSIS — Z79899 Other long term (current) drug therapy: Secondary | ICD-10-CM | POA: Diagnosis not present

## 2017-09-07 DIAGNOSIS — R635 Abnormal weight gain: Secondary | ICD-10-CM | POA: Diagnosis not present

## 2017-09-18 MED FILL — PHENTERMINE 37.5 MG TABLET: 37.5 | 30 days supply | Qty: 30 | Fill #0

## 2017-10-10 DIAGNOSIS — R635 Abnormal weight gain: Secondary | ICD-10-CM | POA: Diagnosis not present

## 2017-10-24 MED FILL — PHENTERMINE 37.5 MG TABLET: 37.5 | 30 days supply | Qty: 30 | Fill #0

## 2017-11-06 DIAGNOSIS — R635 Abnormal weight gain: Secondary | ICD-10-CM | POA: Diagnosis not present

## 2017-11-09 DIAGNOSIS — M25519 Pain in unspecified shoulder: Secondary | ICD-10-CM | POA: Diagnosis not present

## 2017-11-09 DIAGNOSIS — M79601 Pain in right arm: Secondary | ICD-10-CM | POA: Diagnosis not present

## 2017-11-09 DIAGNOSIS — M25511 Pain in right shoulder: Secondary | ICD-10-CM | POA: Diagnosis not present

## 2017-11-09 DIAGNOSIS — M542 Cervicalgia: Secondary | ICD-10-CM | POA: Diagnosis not present

## 2017-11-09 MED FILL — TOPIRAMATE 50 MG TABLET: 50 | 30 days supply | Qty: 30 | Fill #0

## 2017-11-30 MED FILL — PHENTERMINE 37.5 MG TABLET: 37.5 | 30 days supply | Qty: 30 | Fill #0

## 2018-02-20 DIAGNOSIS — R635 Abnormal weight gain: Secondary | ICD-10-CM | POA: Diagnosis not present

## 2018-02-21 MED FILL — TOPIRAMATE 50 MG TABLET: 50 | 30 days supply | Qty: 30 | Fill #0

## 2018-02-21 MED FILL — PHENTERMINE 37.5 MG TABLET: 37.5 | 30 days supply | Qty: 30 | Fill #0

## 2018-03-11 DIAGNOSIS — M25512 Pain in left shoulder: Secondary | ICD-10-CM | POA: Diagnosis not present

## 2018-03-11 DIAGNOSIS — S46912D Strain of unspecified muscle, fascia and tendon at shoulder and upper arm level, left arm, subsequent encounter: Secondary | ICD-10-CM | POA: Diagnosis not present

## 2018-03-13 DIAGNOSIS — M25512 Pain in left shoulder: Secondary | ICD-10-CM | POA: Diagnosis not present

## 2018-03-13 DIAGNOSIS — S46912D Strain of unspecified muscle, fascia and tendon at shoulder and upper arm level, left arm, subsequent encounter: Secondary | ICD-10-CM | POA: Diagnosis not present

## 2018-03-13 MED FILL — MELOXICAM 15 MG TABLET: 15 | 14 days supply | Qty: 14 | Fill #0

## 2018-03-13 MED FILL — predniSONE 5 MG (21) TBPK: 5 | 6 days supply | Qty: 21 | Fill #0

## 2018-03-13 MED FILL — tiZANidine HCL 4 MG TABS: 4 | 30 days supply | Qty: 60 | Fill #0

## 2018-08-09 IMAGING — DX DG KNEE COMPLETE 4+V*R*
4 series · 4 of 4 positions shown · non-contrast
Comparison: None.

CLINICAL DATA: Pain in both knees.  Mild swelling.

EXAM:
RIGHT KNEE - COMPLETE 4+ VIEW

[knee ap]
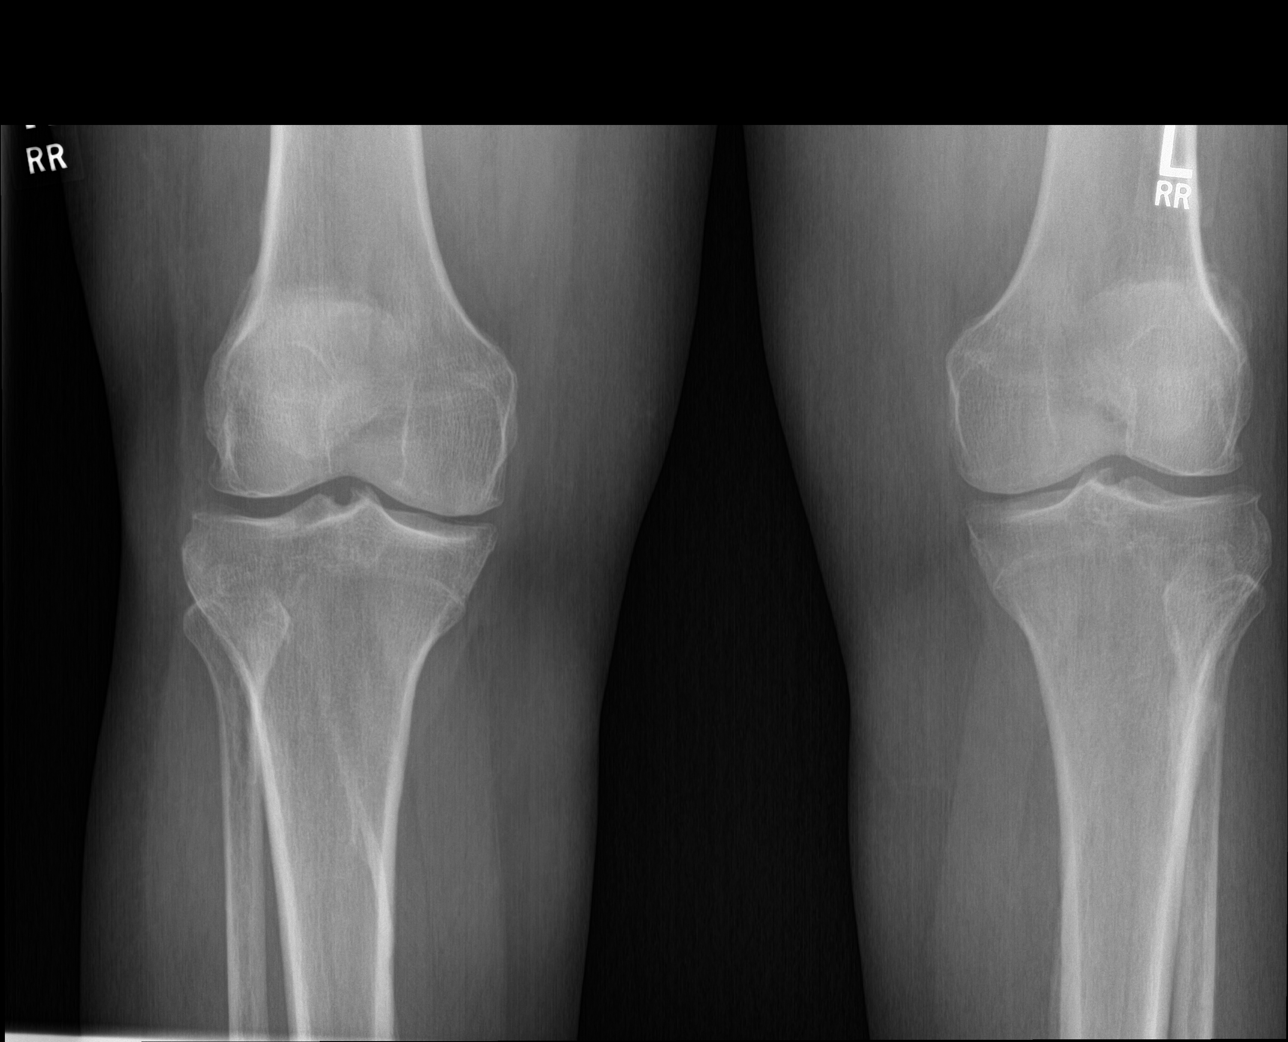

[knee obl (1 of 2)]
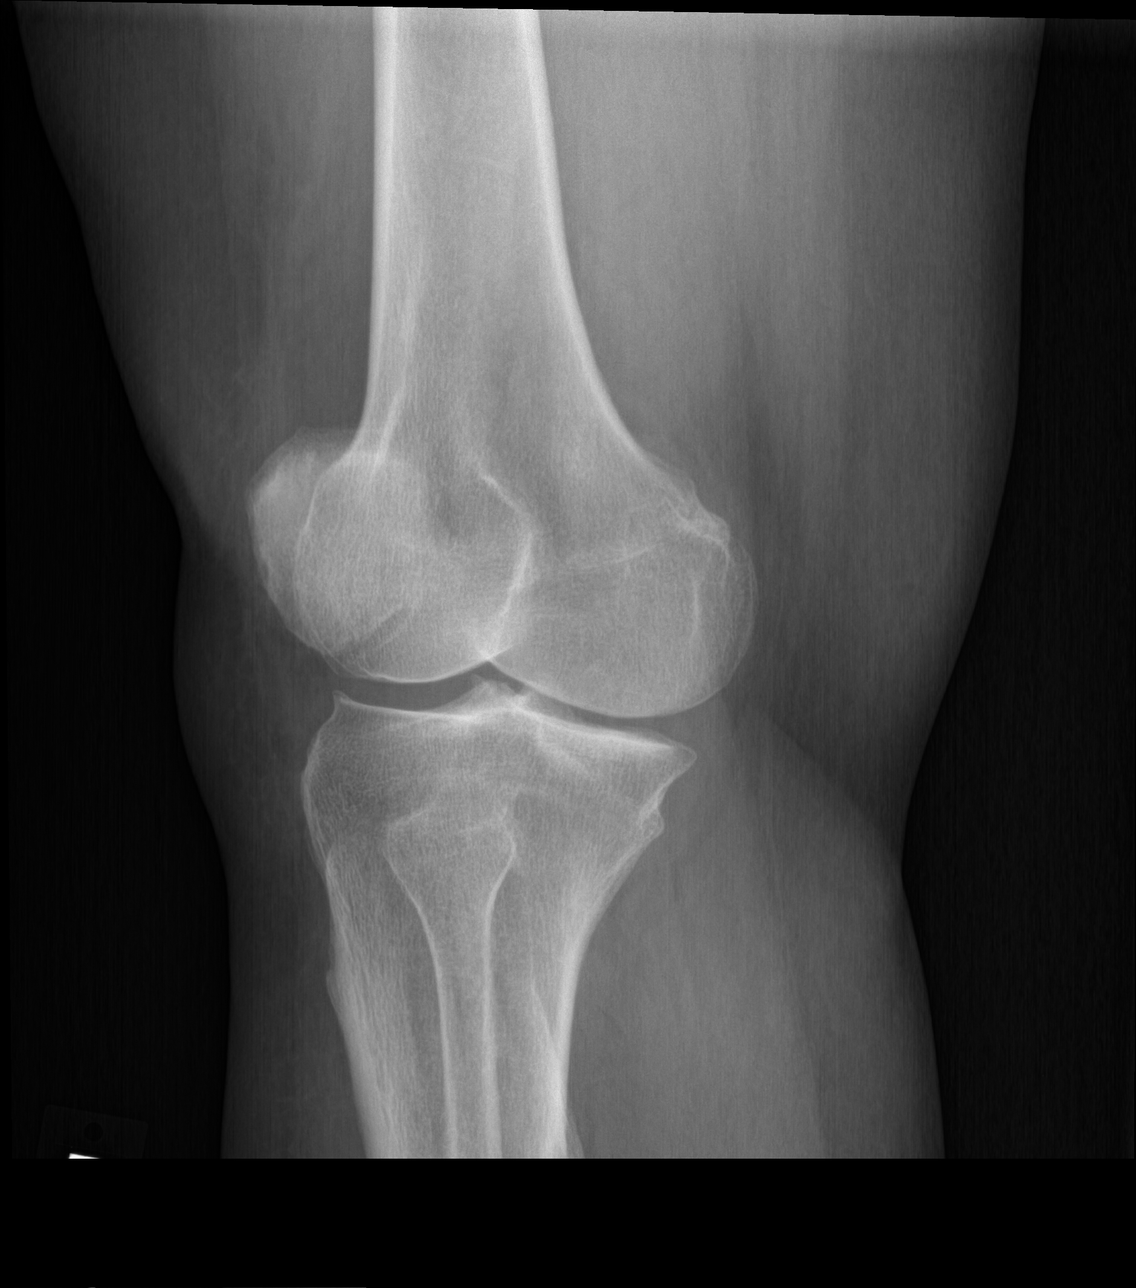

[knee obl (2 of 2)]
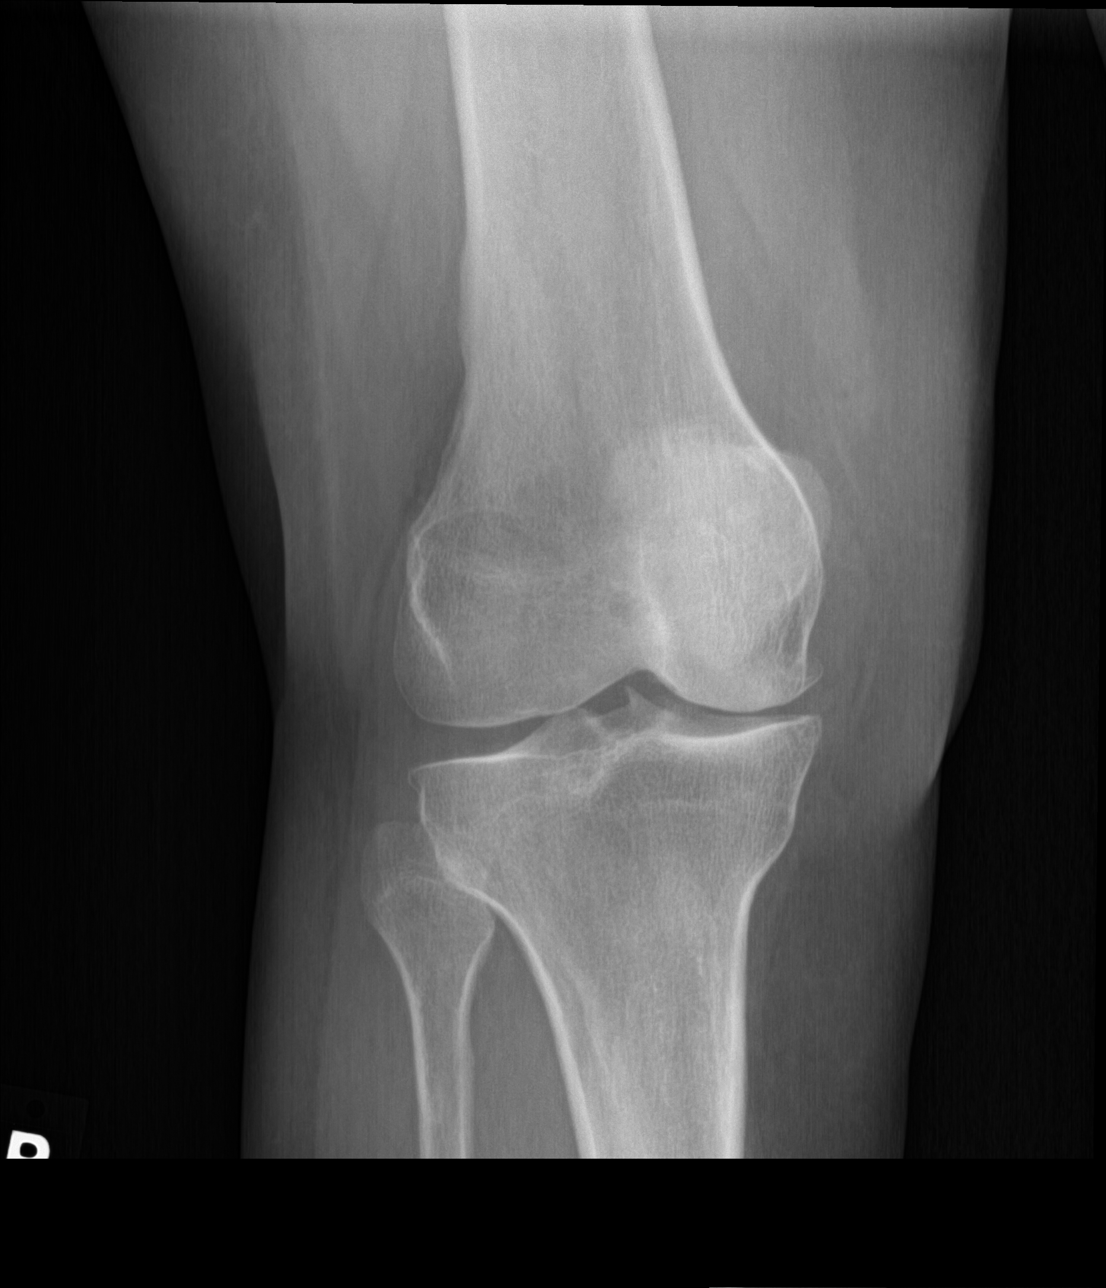

[knee lat]
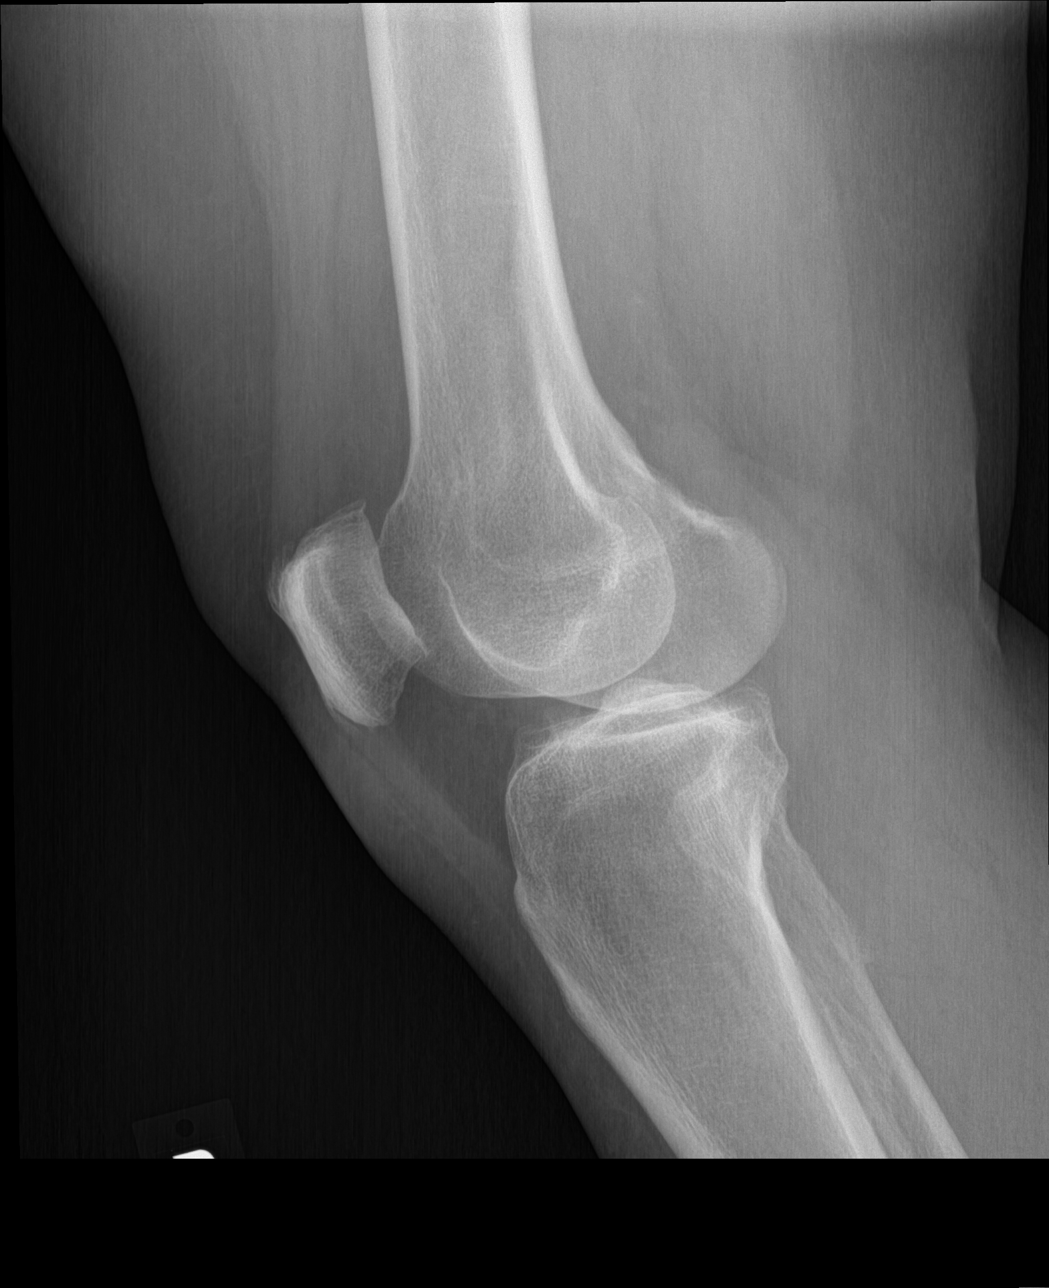

[4 of 4 positions shown; findings below may reference images not displayed]

FINDINGS: No joint effusion. There is moderate try compartment joint space
narrowing, marginal spur formation in sharpening of the tibial
spines. No acute fractures or subluxations.
IMPRESSION: 1. Tricompartment osteoarthritis.
2. No acute findings.

## 2018-09-04 DIAGNOSIS — U071 COVID-19: Secondary | ICD-10-CM | POA: Diagnosis not present

## 2018-09-04 DIAGNOSIS — Z9189 Other specified personal risk factors, not elsewhere classified: Secondary | ICD-10-CM | POA: Diagnosis not present

## 2018-09-04 MED FILL — HYDROXYCHLOROQUINE 200 MG T: 200 | 10 days supply | Qty: 20 | Fill #0

## 2018-09-04 MED FILL — AZITHROMYCIN 250 MG TABS: 250 | 10 days supply | Qty: 11 | Fill #0

## 2018-09-25 DIAGNOSIS — R05 Cough: Secondary | ICD-10-CM | POA: Diagnosis not present

## 2018-09-25 DIAGNOSIS — R634 Abnormal weight loss: Secondary | ICD-10-CM | POA: Diagnosis not present

## 2018-09-25 DIAGNOSIS — M25561 Pain in right knee: Secondary | ICD-10-CM | POA: Diagnosis not present

## 2018-09-25 DIAGNOSIS — M545 Low back pain: Secondary | ICD-10-CM | POA: Diagnosis not present

## 2018-09-25 MED FILL — PHENTERMINE 37.5 MG TABLET: 37.5 | 30 days supply | Qty: 30 | Fill #0

## 2018-11-12 DIAGNOSIS — E559 Vitamin D deficiency, unspecified: Secondary | ICD-10-CM | POA: Diagnosis not present

## 2018-11-12 DIAGNOSIS — R8290 Unspecified abnormal findings in urine: Secondary | ICD-10-CM | POA: Diagnosis not present

## 2018-11-12 DIAGNOSIS — Z1159 Encounter for screening for other viral diseases: Secondary | ICD-10-CM | POA: Diagnosis not present

## 2018-11-12 DIAGNOSIS — Z23 Encounter for immunization: Secondary | ICD-10-CM | POA: Diagnosis not present

## 2018-11-12 DIAGNOSIS — R0602 Shortness of breath: Secondary | ICD-10-CM | POA: Diagnosis not present

## 2018-11-12 DIAGNOSIS — Z114 Encounter for screening for human immunodeficiency virus [HIV]: Secondary | ICD-10-CM | POA: Diagnosis not present

## 2018-11-12 DIAGNOSIS — Z0000 Encounter for general adult medical examination without abnormal findings: Secondary | ICD-10-CM | POA: Diagnosis not present

## 2018-11-12 DIAGNOSIS — M129 Arthropathy, unspecified: Secondary | ICD-10-CM | POA: Diagnosis not present

## 2018-11-12 MED FILL — PHENTERMINE 37.5 MG TABLET: 37.5 | 30 days supply | Qty: 30 | Fill #0

## 2018-11-12 MED FILL — ETODOLAC 400 MG TABS: 400 | 30 days supply | Qty: 30 | Fill #0

## 2019-01-30 DIAGNOSIS — M25512 Pain in left shoulder: Secondary | ICD-10-CM | POA: Diagnosis not present

## 2019-01-30 MED FILL — predniSONE 5 MG (21) TBPK: 5 | 6 days supply | Qty: 21 | Fill #0

## 2019-01-30 MED FILL — IBUPROFEN 800 MG TAB: 800 | 7 days supply | Qty: 14 | Fill #0

## 2019-04-12 DIAGNOSIS — M129 Arthropathy, unspecified: Secondary | ICD-10-CM | POA: Diagnosis not present

## 2019-04-12 DIAGNOSIS — E559 Vitamin D deficiency, unspecified: Secondary | ICD-10-CM | POA: Diagnosis not present

## 2019-04-12 DIAGNOSIS — M170 Bilateral primary osteoarthritis of knee: Secondary | ICD-10-CM | POA: Diagnosis not present

## 2019-04-12 DIAGNOSIS — Z1159 Encounter for screening for other viral diseases: Secondary | ICD-10-CM | POA: Diagnosis not present

## 2019-04-12 DIAGNOSIS — Z79899 Other long term (current) drug therapy: Secondary | ICD-10-CM | POA: Diagnosis not present

## 2019-04-12 MED FILL — predniSONE 5 MG (21) TBPK: 5 | 6 days supply | Qty: 21 | Fill #0

## 2019-04-12 MED FILL — HYDROCODON-APAP 5-325: 5-325 | 5 days supply | Qty: 20 | Fill #0

## 2019-04-15 MED FILL — VIT D2 1.25 MG (50,000 UNIT: 1.25 MG | 28 days supply | Qty: 4 | Fill #0

## 2019-04-26 DIAGNOSIS — R7303 Prediabetes: Secondary | ICD-10-CM | POA: Diagnosis not present

## 2019-04-26 DIAGNOSIS — R635 Abnormal weight gain: Secondary | ICD-10-CM | POA: Diagnosis not present

## 2019-04-26 DIAGNOSIS — Z79899 Other long term (current) drug therapy: Secondary | ICD-10-CM | POA: Diagnosis not present

## 2019-04-26 DIAGNOSIS — M170 Bilateral primary osteoarthritis of knee: Secondary | ICD-10-CM | POA: Diagnosis not present

## 2019-04-26 MED FILL — oxyCODONE HCL 5 MG TABS: 5 | 5 days supply | Qty: 20 | Fill #0

## 2019-04-26 MED FILL — CELECOXIB 200 MG CAP: 200 | 30 days supply | Qty: 30 | Fill #0

## 2019-04-26 MED FILL — TOPIRAMATE 50 MG TABLET: 50 | 30 days supply | Qty: 30 | Fill #0

## 2019-05-16 DIAGNOSIS — M170 Bilateral primary osteoarthritis of knee: Secondary | ICD-10-CM | POA: Diagnosis not present

## 2019-05-16 DIAGNOSIS — R635 Abnormal weight gain: Secondary | ICD-10-CM | POA: Diagnosis not present

## 2019-05-16 DIAGNOSIS — Z79899 Other long term (current) drug therapy: Secondary | ICD-10-CM | POA: Diagnosis not present

## 2019-05-16 DIAGNOSIS — R7303 Prediabetes: Secondary | ICD-10-CM | POA: Diagnosis not present

## 2019-05-16 MED FILL — TOPIRAMATE 100 MG TABLET: 100 | 30 days supply | Qty: 30 | Fill #0

## 2019-05-16 MED FILL — PHENTERMINE 37.5 MG CAPSULE: 37.5 | 30 days supply | Qty: 30 | Fill #0

## 2019-06-14 DIAGNOSIS — M170 Bilateral primary osteoarthritis of knee: Secondary | ICD-10-CM | POA: Diagnosis not present

## 2019-06-14 DIAGNOSIS — Z79899 Other long term (current) drug therapy: Secondary | ICD-10-CM | POA: Diagnosis not present

## 2019-06-14 DIAGNOSIS — R7303 Prediabetes: Secondary | ICD-10-CM | POA: Diagnosis not present

## 2019-06-14 DIAGNOSIS — R635 Abnormal weight gain: Secondary | ICD-10-CM | POA: Diagnosis not present

## 2019-06-14 MED FILL — PHENTERMINE 37.5 MG CAPSULE: 37.5 | 30 days supply | Qty: 30 | Fill #0

## 2019-06-14 MED FILL — TOPIRAMATE 100 MG TABLET: 100 | 30 days supply | Qty: 30 | Fill #0

## 2019-08-10 DIAGNOSIS — M170 Bilateral primary osteoarthritis of knee: Secondary | ICD-10-CM | POA: Diagnosis not present

## 2019-08-10 DIAGNOSIS — Z79899 Other long term (current) drug therapy: Secondary | ICD-10-CM | POA: Diagnosis not present

## 2019-08-10 MED FILL — oxyCODONE HCL 5 MG TABS: 5 | 5 days supply | Qty: 20 | Fill #0

## 2019-08-10 MED FILL — CELECOXIB 100 MG CAPS: 100 | 30 days supply | Qty: 240 | Fill #0

## 2019-11-08 DIAGNOSIS — Z114 Encounter for screening for human immunodeficiency virus [HIV]: Secondary | ICD-10-CM | POA: Diagnosis not present

## 2019-11-08 DIAGNOSIS — Z780 Asymptomatic menopausal state: Secondary | ICD-10-CM | POA: Diagnosis not present

## 2019-11-08 DIAGNOSIS — R8290 Unspecified abnormal findings in urine: Secondary | ICD-10-CM | POA: Diagnosis not present

## 2019-11-08 DIAGNOSIS — Z0000 Encounter for general adult medical examination without abnormal findings: Secondary | ICD-10-CM | POA: Diagnosis not present

## 2019-11-08 DIAGNOSIS — Z13220 Encounter for screening for lipoid disorders: Secondary | ICD-10-CM | POA: Diagnosis not present

## 2019-11-08 DIAGNOSIS — Z23 Encounter for immunization: Secondary | ICD-10-CM | POA: Diagnosis not present

## 2019-11-08 DIAGNOSIS — E559 Vitamin D deficiency, unspecified: Secondary | ICD-10-CM | POA: Diagnosis not present

## 2019-11-08 DIAGNOSIS — R5383 Other fatigue: Secondary | ICD-10-CM | POA: Diagnosis not present

## 2019-11-08 DIAGNOSIS — Z131 Encounter for screening for diabetes mellitus: Secondary | ICD-10-CM | POA: Diagnosis not present

## 2019-11-08 DIAGNOSIS — Z20822 Contact with and (suspected) exposure to covid-19: Secondary | ICD-10-CM | POA: Diagnosis not present

## 2019-11-08 DIAGNOSIS — R0602 Shortness of breath: Secondary | ICD-10-CM | POA: Diagnosis not present

## 2019-11-13 ENCOUNTER — Encounter: Payer: Self-pay | Admitting: Internal Medicine

## 2020-01-06 ENCOUNTER — Ambulatory Visit (AMBULATORY_SURGERY_CENTER): Payer: Self-pay | Admitting: *Deleted

## 2020-01-06 ENCOUNTER — Other Ambulatory Visit: Payer: Self-pay

## 2020-01-06 ENCOUNTER — Other Ambulatory Visit: Payer: Self-pay | Admitting: Internal Medicine

## 2020-01-06 VITALS — Ht 72.0 in | Wt 254.0 lb

## 2020-01-06 DIAGNOSIS — Z86010 Personal history of colonic polyps: Secondary | ICD-10-CM

## 2020-01-06 DIAGNOSIS — Z800 Family history of malignant neoplasm of digestive organs: Secondary | ICD-10-CM

## 2020-01-06 MED ORDER — NA SULFATE-K SULFATE-MG SULF 17.5-3.13-1.6 GM/177ML PO SOLN: ORAL | 0 refills | Status: DC

## 2020-01-06 MED FILL — SUPREP BOWEL PREP KIT: 17.5-3.13-1 | 1 days supply | Qty: 354 | Fill #0

## 2020-01-06 NOTE — Progress Notes (Signed)
Patient is here in-person for PV. Patient denies any allergies to eggs or soy. Patient denies any problems with anesthesia/sedation. Patient denies any oxygen use at home. Patient denies taking any diet/weight loss medications or blood thinners. Patient is not being treated for MRSA or C-diff. Patient is aware of our care-partner policy and [id number] safety protocol. Marland Kitchen   COVID-19 vaccines completed on 03/2019, per patient.

## 2020-01-08 ENCOUNTER — Encounter: Payer: Self-pay | Admitting: Internal Medicine

## 2020-01-20 ENCOUNTER — Other Ambulatory Visit: Payer: Self-pay

## 2020-01-20 ENCOUNTER — Ambulatory Visit (AMBULATORY_SURGERY_CENTER): Payer: 59 | Admitting: Internal Medicine

## 2020-01-20 ENCOUNTER — Encounter: Payer: Self-pay | Admitting: Internal Medicine

## 2020-01-20 VITALS — BP 148/87 | HR 67 | Temp 98.0°F | Resp 18 | Ht 72.0 in | Wt 254.0 lb

## 2020-01-20 DIAGNOSIS — Z86010 Personal history of colonic polyps: Secondary | ICD-10-CM | POA: Diagnosis not present

## 2020-01-20 DIAGNOSIS — Z800 Family history of malignant neoplasm of digestive organs: Secondary | ICD-10-CM | POA: Diagnosis not present

## 2020-01-20 DIAGNOSIS — Z1211 Encounter for screening for malignant neoplasm of colon: Secondary | ICD-10-CM | POA: Diagnosis not present

## 2020-01-20 MED ORDER — SODIUM CHLORIDE 0.9 % IV SOLN: 500.0000 mL | Freq: Once | INTRAVENOUS | Status: DC

## 2020-01-20 NOTE — Progress Notes (Signed)
pt tolerated well. VSS. awake and to recovery. Report given to RN.  

## 2020-01-20 NOTE — Patient Instructions (Signed)
Please read handouts provided. Continue present medications.   YOU HAD AN ENDOSCOPIC PROCEDURE TODAY AT THE [facility]:   Refer to the procedure report that was given to you for any specific questions about what was found during the examination.  If the procedure report does not answer your questions, please call your gastroenterologist to clarify.  If you requested that your care partner not be given the details of your procedure findings, then the procedure report has been included in a sealed envelope for you to review at your convenience later.  YOU SHOULD EXPECT: Some feelings of bloating in the abdomen. Passage of more gas than usual.  Walking can help get rid of the air that was put into your GI tract during the procedure and reduce the bloating. If you had a lower endoscopy (such as a colonoscopy or flexible sigmoidoscopy) you may notice spotting of blood in your stool or on the toilet paper. If you underwent a bowel prep for your procedure, you may not have a normal bowel movement for a few days.  Please Note:  You might notice some irritation and congestion in your nose or some drainage.  This is from the oxygen used during your procedure.  There is no need for concern and it should clear up in a day or so.  SYMPTOMS TO REPORT IMMEDIATELY:  Following lower endoscopy (colonoscopy or flexible sigmoidoscopy):  Excessive amounts of blood in the stool  Significant tenderness or worsening of abdominal pains  Swelling of the abdomen that is new, acute  Fever of 100F or higher   For urgent or emergent issues, a gastroenterologist can be reached at any hour by calling [phone]. Do not use MyChart messaging for urgent concerns.    DIET:  We do recommend a small meal at first, but then you may proceed to your regular diet.  Drink plenty of fluids but you should avoid alcoholic beverages for 24 hours.  ACTIVITY:  You should plan to take it easy for the rest of today and  you should NOT DRIVE or use heavy machinery until tomorrow (because of the sedation medicines used during the test).    FOLLOW UP: Our staff will call the number listed on your records 48-72 hours following your procedure to check on you and address any questions or concerns that you may have regarding the information given to you following your procedure. If we do not reach you, we will leave a message.  We will attempt to reach you two times.  During this call, we will ask if you have developed any symptoms of COVID 19. If you develop any symptoms (ie: fever, flu-like symptoms, shortness of breath, cough etc.) before then, please call [phone].  If you test positive for Covid 19 in the 2 weeks post procedure, please call and report this information to us.    If any biopsies were taken you will be contacted by phone or by letter within the next 1-3 weeks.  Please call us at [phone] if you have not heard about the biopsies in 3 weeks.    SIGNATURES/CONFIDENTIALITY: You and/or your care partner have signed paperwork which will be entered into your electronic medical record.  These signatures attest to the fact that that the information above on your After Visit Summary has been reviewed and is understood.  Full responsibility of the confidentiality of this discharge information lies with you and/or your care-partner.  

## 2020-01-20 NOTE — Op Note (Signed)
[place] Patient Name: Mary Burton Procedure Date: 01/20/2020 10:14 AM MRN: [id number] Endoscopist: Jerene Bears , MD Age: 59 Referring MD:  Date of Birth: 1960-10-08 Gender: Female Account #: [id number] Procedure:                Colonoscopy Indications:              High risk colon cancer surveillance: Personal                            history of non-advanced adenoma at last colonoscopy                            Sept 2013, Family history of colon cancer in a                            first-degree relative Medicines:                Monitored Anesthesia Care Procedure:                Pre-Anesthesia Assessment:                           - Prior to the procedure, a History and Physical                            was performed, and patient medications and                            allergies were reviewed. The patient's tolerance of                            previous anesthesia was also reviewed. The risks                            and benefits of the procedure and the sedation                            options and risks were discussed with the patient.                            All questions were answered, and informed consent                            was obtained. Prior Anticoagulants: The patient has                            taken no previous anticoagulant or antiplatelet                            agents. ASA Grade Assessment: II - A patient with                            mild systemic disease. After reviewing the risks  and benefits, the patient was deemed in                            satisfactory condition to undergo the procedure.                           After obtaining informed consent, the colonoscope                            was passed under direct vision. Throughout the                            procedure, the patient's blood pressure, pulse, and                            oxygen saturations were monitored  continuously. The                            Colonoscope was introduced through the anus and                            advanced to the cecum, identified by appendiceal                            orifice and ileocecal valve. The colonoscopy was                            performed without difficulty. The patient tolerated                            the procedure well. The quality of the bowel                            preparation was good. The ileocecal valve,                            appendiceal orifice, and rectum were photographed. Scope In: 10:17:49 AM Scope Out: 10:30:24 AM Scope Withdrawal Time: 0 hours 9 minutes 32 seconds  Total Procedure Duration: 0 hours 12 minutes 35 seconds  Findings:                 The digital rectal exam was normal.                           The entire examined colon appeared normal on direct                            and retroflexion views. Complications:            No immediate complications. Estimated Blood Loss:     Estimated blood loss: none. Impression:               - The entire examined colon is normal on direct and  retroflexion views.                           - No specimens collected. Recommendation:           - Patient has a contact number available for                            emergencies. The signs and symptoms of potential                            delayed complications were discussed with the                            patient. Return to normal activities tomorrow.                            Written discharge instructions were provided to the                            patient.                           - Resume previous diet.                           - Continue present medications.                           - Repeat colonoscopy in 5 years for surveillance. Jerene Bears, MD 01/20/2020 10:33:01 AM This report has been signed electronically.

## 2020-01-20 NOTE — Progress Notes (Signed)
Medical history reviewed with no changes noted. VS assessed by C.W 

## 2020-01-22 ENCOUNTER — Telehealth: Payer: Self-pay | Admitting: *Deleted

## 2020-01-22 ENCOUNTER — Telehealth: Payer: Self-pay

## 2020-01-22 NOTE — Telephone Encounter (Signed)
  Follow up Call-  Call back number 01/20/2020  Post procedure Call Back phone  # [phone]  Permission to leave phone message Yes  Some recent data might be hidden     No answer at 2nd attempt follow up phone call.  Left message on voicemail.

## 2020-01-22 NOTE — Telephone Encounter (Signed)
First post procedure follow up call, no answer 

## 2020-02-12 ENCOUNTER — Other Ambulatory Visit (HOSPITAL_COMMUNITY): Payer: Self-pay | Admitting: Family Medicine

## 2020-02-12 DIAGNOSIS — R635 Abnormal weight gain: Secondary | ICD-10-CM | POA: Diagnosis not present

## 2020-02-12 DIAGNOSIS — M170 Bilateral primary osteoarthritis of knee: Secondary | ICD-10-CM | POA: Diagnosis not present

## 2020-02-12 DIAGNOSIS — R7303 Prediabetes: Secondary | ICD-10-CM | POA: Diagnosis not present

## 2020-02-12 DIAGNOSIS — Z79899 Other long term (current) drug therapy: Secondary | ICD-10-CM | POA: Diagnosis not present

## 2020-02-12 MED FILL — TOPIRAMATE 100 MG TABLET: 100 | 30 days supply | Qty: 30 | Fill #0

## 2020-02-12 MED FILL — PHENTERMINE 37.5 MG CAPSULE: 37.5 | 30 days supply | Qty: 30 | Fill #0

## 2020-03-13 ENCOUNTER — Other Ambulatory Visit (HOSPITAL_COMMUNITY): Payer: Self-pay | Admitting: Family Medicine

## 2020-03-13 DIAGNOSIS — M170 Bilateral primary osteoarthritis of knee: Secondary | ICD-10-CM | POA: Diagnosis not present

## 2020-03-13 DIAGNOSIS — Z6841 Body Mass Index (BMI) 40.0 and over, adult: Secondary | ICD-10-CM | POA: Diagnosis not present

## 2020-03-13 DIAGNOSIS — R635 Abnormal weight gain: Secondary | ICD-10-CM | POA: Diagnosis not present

## 2020-03-13 DIAGNOSIS — R7303 Prediabetes: Secondary | ICD-10-CM | POA: Diagnosis not present

## 2020-03-13 MED FILL — PHENTERMINE 37.5 MG CAPSULE: 37.5 | 30 days supply | Qty: 30 | Fill #0

## 2020-03-13 MED FILL — TOPIRAMATE 100 MG TABLET: 100 | 30 days supply | Qty: 30 | Fill #0

## 2020-05-17 DIAGNOSIS — Z6841 Body Mass Index (BMI) 40.0 and over, adult: Secondary | ICD-10-CM | POA: Diagnosis not present

## 2020-05-17 DIAGNOSIS — M25511 Pain in right shoulder: Secondary | ICD-10-CM | POA: Diagnosis not present

## 2020-12-28 DIAGNOSIS — Z0000 Encounter for general adult medical examination without abnormal findings: Secondary | ICD-10-CM | POA: Diagnosis not present

## 2020-12-28 DIAGNOSIS — S46912D Strain of unspecified muscle, fascia and tendon at shoulder and upper arm level, left arm, subsequent encounter: Secondary | ICD-10-CM | POA: Diagnosis not present

## 2020-12-28 DIAGNOSIS — M129 Arthropathy, unspecified: Secondary | ICD-10-CM | POA: Diagnosis not present

## 2020-12-28 DIAGNOSIS — R5383 Other fatigue: Secondary | ICD-10-CM | POA: Diagnosis not present

## 2020-12-28 DIAGNOSIS — E559 Vitamin D deficiency, unspecified: Secondary | ICD-10-CM | POA: Diagnosis not present

## 2021-02-15 DIAGNOSIS — M170 Bilateral primary osteoarthritis of knee: Secondary | ICD-10-CM | POA: Diagnosis not present

## 2021-04-03 DIAGNOSIS — R030 Elevated blood-pressure reading, without diagnosis of hypertension: Secondary | ICD-10-CM | POA: Diagnosis not present

## 2021-04-03 DIAGNOSIS — Z6841 Body Mass Index (BMI) 40.0 and over, adult: Secondary | ICD-10-CM | POA: Diagnosis not present

## 2021-04-03 DIAGNOSIS — R079 Chest pain, unspecified: Secondary | ICD-10-CM | POA: Diagnosis not present

## 2021-04-12 DIAGNOSIS — R0602 Shortness of breath: Secondary | ICD-10-CM | POA: Diagnosis not present

## 2021-04-12 DIAGNOSIS — R0789 Other chest pain: Secondary | ICD-10-CM | POA: Diagnosis not present

## 2021-04-12 DIAGNOSIS — M7989 Other specified soft tissue disorders: Secondary | ICD-10-CM | POA: Diagnosis not present

## 2021-04-12 DIAGNOSIS — R600 Localized edema: Secondary | ICD-10-CM | POA: Diagnosis not present

## 2021-04-13 ENCOUNTER — Telehealth: Payer: Self-pay

## 2021-04-13 NOTE — Telephone Encounter (Signed)
Notes scanned to referral 

## 2021-04-20 DIAGNOSIS — Z1231 Encounter for screening mammogram for malignant neoplasm of breast: Secondary | ICD-10-CM | POA: Diagnosis not present

## 2021-04-20 DIAGNOSIS — M25511 Pain in right shoulder: Secondary | ICD-10-CM | POA: Diagnosis not present

## 2021-06-18 DIAGNOSIS — M545 Low back pain, unspecified: Secondary | ICD-10-CM | POA: Diagnosis not present

## 2021-06-25 ENCOUNTER — Ambulatory Visit: Payer: 59 | Admitting: Physician Assistant

## 2021-07-02 ENCOUNTER — Ambulatory Visit (INDEPENDENT_AMBULATORY_CARE_PROVIDER_SITE_OTHER): Payer: 59

## 2021-07-02 ENCOUNTER — Ambulatory Visit: Payer: 59 | Admitting: Physician Assistant

## 2021-07-02 DIAGNOSIS — M7541 Impingement syndrome of right shoulder: Secondary | ICD-10-CM

## 2021-07-02 DIAGNOSIS — G8929 Other chronic pain: Secondary | ICD-10-CM | POA: Diagnosis not present

## 2021-07-02 DIAGNOSIS — M25511 Pain in right shoulder: Secondary | ICD-10-CM

## 2021-07-02 HISTORY — DX: Impingement syndrome of right shoulder: M75.41

## 2021-07-02 MED ORDER — METHYLPREDNISOLONE ACETATE 40 MG/ML IJ SUSP
80.0000 mg | INTRAMUSCULAR | Status: AC | PRN
  Administered 2021-07-02: 80 mg via INTRA_ARTICULAR

## 2021-07-02 MED ORDER — LIDOCAINE HCL 1 % IJ SOLN
2.0000 mL | INTRAMUSCULAR | Status: AC | PRN
  Administered 2021-07-02: 2 mL

## 2021-07-02 MED ORDER — BUPIVACAINE HCL 0.25 % IJ SOLN
2.0000 mL | INTRAMUSCULAR | Status: AC | PRN
  Administered 2021-07-02: 2 mL via INTRA_ARTICULAR

## 2021-07-02 NOTE — Progress Notes (Signed)
Office Visit Note   Patient: Mary Burton           Date of Birth: 1961/01/23           MRN: [id number] Visit Date: 07/02/2021              Requested by: No referring provider defined for this encounter. PCP: Patient, No Pcp Per (Inactive)  Chief Complaint  Patient presents with   Right Shoulder - Pain      HPI: Patient is a very pleasant 61 year old woman who comes in today with a chief complaint of right chronic shoulder pain.  She said that she has had this for a while but it seems to have gotten a little bit worse.  She has seen her primary care provider who has been giving her IM injections of steroid from what I gather.  She said they have only been short-lived.  She did say that she had gotten into a shoulder injection in the past many years ago and she got long-lasting relief.  Assessment & Plan: Visit Diagnoses:  1. Chronic right shoulder pain   2. 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  She would like to just try an injection today.  I will go forward with that.  She will follow-up in 3 weeks but can cancel that appointment if she is better.  Also gave her home exercises for rotator cuff strengthening  Follow-Up Instructions: No follow-ups on file.   Ortho Exam  Patient is alert, oriented, no adenopathy, well-dressed, normal affect, normal respiratory effort. Examined of her right shoulder she has no redness no swelling.  She does have good range of motion with forward elevation some decreased internal rotation behind her back strength is 5 out of 5 with resisted external and internal rotation and resisted abduction.  She does have a positive empty can test and positive impingement findings.  Imaging: XR Shoulder Right  Result Date: 07/02/2021 Radiographs of her right shoulder demonstrate overall well-maintained alignment in the humeral glenohumeral joint.  She has no acute  fractures.  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  Labs: Lab Results  Component Value Date   HGBA1C 5.8 02/28/2013   LABURIC 4.3 04/02/2014     Lab Results  Component Value Date   ALBUMIN 3.9 04/02/2014   ALBUMIN 4.0 12/11/2012    No results found for: MG No results found for: VD25OH  No results found for: PREALBUMIN    Latest Ref Rng & Units 04/02/2014    4:10 PM  CBC EXTENDED  WBC 4.0 - 10.5 K/uL 4.7    RBC 3.87 - 5.11 MIL/uL 4.21    Hemoglobin 12.0 - 15.0 g/dL 11.9    HCT 36.0 - 46.0 % 37.4    Platelets 150 - 400 K/uL 237    NEUT# 1.7 - 7.7 K/uL 2.7    Lymph# 0.7 - 4.0 K/uL 1.5       There is no height or weight on file to calculate BMI.  Orders:  Orders Placed This Encounter  Procedures   XR Shoulder Right   No orders of the defined types were placed in this encounter.    Procedures: Large Joint Inj: R subacromial bursa on 07/02/2021 11:30 AM Indications: diagnostic evaluation and pain Details: 25 G 1.5 in needle, posterior approach  Arthrogram: No  Medications: 2 mL lidocaine 1 %; 80 mg methylPREDNISolone acetate 40 MG/ML; 2 mL bupivacaine 0.25 % Outcome: tolerated well, no immediate complications Procedure, treatment alternatives, risks and benefits explained, specific risks discussed. Consent was given by the patient.    Clinical Data: No additional findings.  ROS:  All other systems negative, except as noted in the HPI. Review of Systems  All other systems reviewed and are negative.  Objective: Vital Signs: LMP 07/07/2011   Specialty Comments:  No specialty comments available.  PMFS History: Patient Active Problem List   Diagnosis Date Noted   Impingement syndrome of right shoulder 07/02/2021   Bilateral foot pain 04/02/2014   Bilateral swelling of feet 06/06/2011   Seasonal allergies 06/06/2011   Bilateral knee pain 06/06/2011   Past  Medical History:  Diagnosis Date   Allergy    Environmental allergies     Family History  Problem Relation Age of Onset   Colon cancer Mother        unsure age   Hypertension Father    Colon polyps Neg Hx    Esophageal cancer Neg Hx    Rectal cancer Neg Hx    Stomach cancer Neg Hx     Past Surgical History:  Procedure Laterality Date   COLONOSCOPY  10/12/2011   Pyrtle   OVARIAN CYST REMOVAL     POLYPECTOMY     TUBAL LIGATION  1985   Social History   Occupational History   Not on file  Tobacco Use   Smoking status: Never   Smokeless tobacco: Never  Vaping Use   Vaping Use: Never used  Substance and Sexual Activity   Alcohol use: No   Drug use: No   Sexual activity: Yes    Birth control/protection: None

## 2021-08-23 ENCOUNTER — Ambulatory Visit: Payer: 59 | Admitting: Family Medicine

## 2021-09-10 ENCOUNTER — Ambulatory Visit (INDEPENDENT_AMBULATORY_CARE_PROVIDER_SITE_OTHER): Payer: 59 | Admitting: Family

## 2021-09-10 DIAGNOSIS — M6702 Short Achilles tendon (acquired), left ankle: Secondary | ICD-10-CM | POA: Diagnosis not present

## 2021-09-10 DIAGNOSIS — L97521 Non-pressure chronic ulcer of other part of left foot limited to breakdown of skin: Secondary | ICD-10-CM

## 2021-09-10 DIAGNOSIS — L84 Corns and callosities: Secondary | ICD-10-CM

## 2021-09-10 NOTE — Progress Notes (Addendum)
Office Visit Note   Patient: Mary Burton           Date of Birth: 1960/06/04           MRN: [id number] Visit Date: 09/10/2021              Requested by: No referring provider defined for this encounter. PCP: System, Provider Not In  Chief Complaint  Patient presents with   Left Foot - Pain      HPI: The patient is a 61 year old woman who presents today concerned for a painful area of callus underneath the lateral column of her left foot states that she has a "core" growing in her foot this has been ongoing she has debrided it some herself but states it comes back quickly today is in slide on shoe wear  Assessment & Plan: Visit Diagnoses:  1. Heel cord tightness, left   2. Corn of foot   3. Skin ulcer of plantar aspect of foot, left, limited to breakdown of skin (HCC)     Plan: Corn was debrided with a 10 blade knife after informed consent.  Discussed heel cord stretching recommended supportive shoe wear or an orthotic, she is given a referral per her request to podiatry for fabrication of custom orthotics  Follow-Up Instructions: Return if symptoms worsen or fail to improve.   Ortho Exam  Patient is alert, oriented, no adenopathy, well-dressed, normal affect, normal respiratory effort. On examination of the left foot the patient has callused ulceration beneath the fifth metatarsal head on the left there is no edema no erythema no open area this was debrided of nonviable tissue the corn was debrided with a 10 blade knife patient voiced relief Imaging: No results found. No images are attached to the encounter.  Labs: Lab Results  Component Value Date   HGBA1C 5.8 02/28/2013   LABURIC 4.3 04/02/2014     Lab Results  Component Value Date   ALBUMIN 3.9 04/02/2014   ALBUMIN 4.0 12/11/2012    No results found for: "MG" No results found for: "VD25OH"  No results found for: "PREALBUMIN"    Latest Ref Rng & Units 04/02/2014    4:10 PM  CBC EXTENDED  WBC 4.0  - 10.5 K/uL 4.7   RBC 3.87 - 5.11 MIL/uL 4.21   Hemoglobin 12.0 - 15.0 g/dL 11.9   HCT 36.0 - 46.0 % 37.4   Platelets 150 - 400 K/uL 237   NEUT# 1.7 - 7.7 K/uL 2.7   Lymph# 0.7 - 4.0 K/uL 1.5      There is no height or weight on file to calculate BMI.  Orders:  Orders Placed This Encounter  Procedures   Ambulatory referral to Podiatry   No orders of the defined types were placed in this encounter.    Procedures: No procedures performed  Clinical Data: No additional findings.  ROS:  All other systems negative, except as noted in the HPI. Review of Systems  Objective: Vital Signs: LMP 05/22/2011   Specialty Comments:  No specialty comments available.  PMFS History: Patient Active Problem List   Diagnosis Date Noted   Impingement syndrome of right shoulder 07/02/2021   Bilateral foot pain 04/02/2014   Bilateral swelling of feet 06/06/2011   Seasonal allergies 06/06/2011   Bilateral knee pain 06/06/2011   Past Medical History:  Diagnosis Date   Allergy    Environmental allergies     Family History  Problem Relation Age of Onset   Colon cancer Mother  unsure age   Hypertension Father    Colon polyps Neg Hx    Esophageal cancer Neg Hx    Rectal cancer Neg Hx    Stomach cancer Neg Hx     Past Surgical History:  Procedure Laterality Date   COLONOSCOPY  10/12/2011   Pyrtle   OVARIAN CYST REMOVAL     POLYPECTOMY     TUBAL LIGATION  1985   Social History   Occupational History   Not on file  Tobacco Use   Smoking status: Never   Smokeless tobacco: Never  Vaping Use   Vaping Use: Never used  Substance and Sexual Activity   Alcohol use: No   Drug use: No   Sexual activity: Yes    Birth control/protection: None

## 2021-09-15 ENCOUNTER — Encounter: Payer: Self-pay | Admitting: Family

## 2021-10-06 ENCOUNTER — Encounter: Payer: Self-pay | Admitting: Family Medicine

## 2021-10-06 ENCOUNTER — Other Ambulatory Visit (HOSPITAL_COMMUNITY): Payer: Self-pay

## 2021-10-06 ENCOUNTER — Ambulatory Visit: Payer: 59 | Admitting: Family Medicine

## 2021-10-06 VITALS — BP 136/80 | HR 92 | Temp 97.6°F | Ht 72.0 in | Wt 249.0 lb

## 2021-10-06 DIAGNOSIS — R7303 Prediabetes: Secondary | ICD-10-CM

## 2021-10-06 DIAGNOSIS — M25561 Pain in right knee: Secondary | ICD-10-CM

## 2021-10-06 DIAGNOSIS — G8929 Other chronic pain: Secondary | ICD-10-CM | POA: Diagnosis not present

## 2021-10-06 DIAGNOSIS — N3001 Acute cystitis with hematuria: Secondary | ICD-10-CM | POA: Diagnosis not present

## 2021-10-06 DIAGNOSIS — Z7689 Persons encountering health services in other specified circumstances: Secondary | ICD-10-CM

## 2021-10-06 DIAGNOSIS — Z83438 Family history of other disorder of lipoprotein metabolism and other lipidemia: Secondary | ICD-10-CM

## 2021-10-06 DIAGNOSIS — R350 Frequency of micturition: Secondary | ICD-10-CM

## 2021-10-06 DIAGNOSIS — E669 Obesity, unspecified: Secondary | ICD-10-CM

## 2021-10-06 DIAGNOSIS — M25562 Pain in left knee: Secondary | ICD-10-CM

## 2021-10-06 HISTORY — DX: Prediabetes: R73.03

## 2021-10-06 LAB — POCT URINALYSIS DIPSTICK
Bilirubin, UA: NEGATIVE
Blood, UA: POSITIVE
Glucose, UA: NEGATIVE
Ketones, UA: NEGATIVE
Nitrite, UA: NEGATIVE
Protein, UA: POSITIVE — AB
Spec Grav, UA: >=1.03 — AB (ref 1.010–1.025)
Urobilinogen, UA: 0.2 E.U./dL
pH, UA: 6 (ref 5.0–8.0)

## 2021-10-06 LAB — COMPREHENSIVE METABOLIC PANEL
ALT: 13 U/L (ref 0–35)
AST: 15 U/L (ref 0–37)
Albumin: 4 g/dL (ref 3.5–5.2)
Alkaline Phosphatase: 60 U/L (ref 39–117)
BUN: 14 mg/dL (ref 6–23)
CO2: 30 mEq/L (ref 19–32)
Calcium: 9.3 mg/dL (ref 8.4–10.5)
Chloride: 103 mEq/L (ref 96–112)
Creatinine, Ser: 0.51 mg/dL (ref 0.40–1.20)
GFR: 100.18 mL/min (ref >60.00)
Glucose, Bld: 92 mg/dL (ref 70–99)
Potassium: 4.1 mEq/L (ref 3.5–5.1)
Sodium: 140 mEq/L (ref 135–145)
Total Bilirubin: 0.3 mg/dL (ref 0.2–1.2)
Total Protein: 7.7 g/dL (ref 6.0–8.3)

## 2021-10-06 LAB — CBC WITH DIFFERENTIAL/PLATELET
Basophils Absolute: 0 10*3/uL (ref 0.0–0.1)
Basophils Relative: 0.3 % (ref 0.0–3.0)
Eosinophils Absolute: 0.1 10*3/uL (ref 0.0–0.7)
Eosinophils Relative: 2.1 % (ref 0.0–5.0)
HCT: 36.9 % (ref 36.0–46.0)
Hemoglobin: 12 g/dL (ref 12.0–15.0)
Lymphocytes Relative: 30.7 % (ref 12.0–46.0)
Lymphs Abs: 1.6 10*3/uL (ref 0.7–4.0)
MCHC: 32.6 g/dL (ref 30.0–36.0)
MCV: 87.3 fl (ref 78.0–100.0)
Monocytes Absolute: 0.4 10*3/uL (ref 0.1–1.0)
Monocytes Relative: 8.8 % (ref 3.0–12.0)
Neutro Abs: 2.9 10*3/uL (ref 1.4–7.7)
Neutrophils Relative %: 58.1 % (ref 43.0–77.0)
Platelets: 201 10*3/uL (ref 150.0–400.0)
RBC: 4.22 Mil/uL (ref 3.87–5.11)
RDW: 14.6 % (ref 11.5–15.5)
WBC: 5.1 10*3/uL (ref 4.0–10.5)

## 2021-10-06 LAB — HEMOGLOBIN A1C: Hgb A1c MFr Bld: 6 % (ref 4.6–6.5)

## 2021-10-06 LAB — LIPID PANEL
Cholesterol: 141 mg/dL (ref 0–200)
HDL: 52.9 mg/dL (ref >39.00)
LDL Cholesterol: 67 mg/dL (ref 0–99)
NonHDL: 88.56
Total CHOL/HDL Ratio: 3
Triglycerides: 110 mg/dL (ref 0.0–149.0)
VLDL: 22 mg/dL (ref 0.0–40.0)

## 2021-10-06 LAB — T4, FREE: Free T4: 0.98 ng/dL (ref 0.60–1.60)

## 2021-10-06 LAB — TSH: TSH: 0.88 u[IU]/mL (ref 0.35–5.50)

## 2021-10-06 MED ORDER — NITROFURANTOIN MONOHYD MACRO 100 MG PO CAPS
100.0000 mg | ORAL_CAPSULE | Freq: Two times a day (BID) | ORAL | 0 refills | Status: DC
  Filled 2021-10-06: qty 10, 5d supply, fill #0

## 2021-10-06 NOTE — Patient Instructions (Addendum)
Thank you for trusting [place] with your health care.  Please go to the lab on the first floor before you leave today.  I prescribed an antibiotic for your urinary tract infection.  Make sure you are drinking plenty of water and avoid sodas and sugary beverages.  We will be in touch with your results.   Please call and schedule with an OB/GYN   Obgyn Offices:   [facility] [address] [place], [place] [phone]  [facility] Address: [address] [place], [place] [address] Phone: [phone]  [facility] [address] [place], [place] [address] Phone: [phone]  [facility], [place] [address] Phone: [phone]

## 2021-10-06 NOTE — Assessment & Plan Note (Signed)
Counseling on diet. Consider Wegovy or Ozempic pending Hgb A1c results. Follow up in 4 weeks or sooner if needed.

## 2021-10-06 NOTE — Assessment & Plan Note (Signed)
Recommend limiting sugar, carbohydrates. Check A1c and follow up

## 2021-10-06 NOTE — Assessment & Plan Note (Signed)
Followed by orthopedist

## 2021-10-06 NOTE — Progress Notes (Signed)
New Patient Office Visit  Subjective    Patient ID: Mary Burton, female    DOB: 1960/08/31  Age: 61 y.o. MRN: [id number]  CC:  Chief Complaint  Patient presents with   Establish Care    Has been running to the bathroom a lot recently and would like to be checked for diabetes as it runs in her family.   Would like to discuss weight loss options, mentions a shot she could possibly take.     HPI REATA PETROV presents to establish care [place] for foot and knee pain   Complains of urinary frequency, urgency and left flank pain.   Hx of prediabetes with family hx of diabetes.   Stopped drinking soda   Interested in trying weigh loss medication.   Denies personal or family hx of thyroid cancer or pancreatitis.   Denies fever, chills, dizziness, chest pain, palpitations, shortness of breath, abdominal pain, N/V/D.     Outpatient Encounter Medications as of 10/06/2021  Medication Sig   [DISCONTINUED] nitrofurantoin, macrocrystal-monohydrate, (MACROBID) 100 MG capsule Take 1 capsule (100 mg total) by mouth 2 (two) times daily.   nitrofurantoin, macrocrystal-monohydrate, (MACROBID) 100 MG capsule Take 1 capsule (100 mg total) by mouth 2 (two) times daily.   [DISCONTINUED] phentermine 37.5 MG capsule TAKE 1 CAPSULE BY MOUTH DAILY   [DISCONTINUED] phentermine 37.5 MG capsule TAKE 1 CAPSULE BY MOUTH ONCE A DAY   [DISCONTINUED] topiramate (TOPAMAX) 100 MG tablet TAKE 1 TABLET BY MOUTH DAILY   [DISCONTINUED] topiramate (TOPAMAX) 100 MG tablet TAKE 1 TABLET BY MOUTH DAILY   No facility-administered encounter medications on file as of 10/06/2021.    Past Medical History:  Diagnosis Date   Allergy    Environmental allergies     Past Surgical History:  Procedure Laterality Date   COLONOSCOPY  10/12/2011   Pyrtle   OVARIAN CYST REMOVAL     POLYPECTOMY     TUBAL LIGATION  1985    Family History  Problem Relation Age of Onset   Colon cancer Mother         unsure age   Hypertension Father    Early death Sister    High blood pressure Sister    Colon polyps Neg Hx    Esophageal cancer Neg Hx    Rectal cancer Neg Hx    Stomach cancer Neg Hx     Social History   Socioeconomic History   Marital status: Single    Spouse name: Not on file   Number of children: Not on file   Years of education: Not on file   Highest education level: Not on file  Occupational History   Not on file  Tobacco Use   Smoking status: Never   Smokeless tobacco: Never  Vaping Use   Vaping Use: Never used  Substance and Sexual Activity   Alcohol use: No   Drug use: No   Sexual activity: Yes    Birth control/protection: None  Other Topics Concern   Not on file  Social History Narrative   Not on file   Social Determinants of Health   Financial Resource Strain: Not on file  Food Insecurity: Not on file  Transportation Needs: Not on file  Physical Activity: Not on file  Stress: Not on file  Social Connections: Not on file  Intimate Partner Violence: Not on file    ROS Pertinent positives and negatives in the history of present illness.      Objective  BP 136/80 (BP Location: Left Arm, Patient Position: Sitting, Cuff Size: Large)   Pulse 92   Temp 97.6 F (36.4 C) (Temporal)   Ht 6' (1.829 m)   Wt 249 lb (112.9 kg)   LMP 05/22/2011   SpO2 98%   BMI 33.77 kg/m   Physical Exam Constitutional:      General: She is not in acute distress.    Appearance: She is not ill-appearing.  Cardiovascular:     Rate and Rhythm: Normal rate and regular rhythm.  Pulmonary:     Effort: Pulmonary effort is normal.     Breath sounds: Normal breath sounds.  Musculoskeletal:     Cervical back: Normal range of motion and neck supple. No tenderness.  Lymphadenopathy:     Cervical: No cervical adenopathy.  Skin:    General: Skin is warm and dry.  Neurological:     General: No focal deficit present.     Mental Status: She is alert and oriented  to person, place, and time.  Psychiatric:        Behavior: Behavior normal.         Assessment & Plan:   Problem List Items Addressed This Visit       Genitourinary   Acute cystitis with hematuria - Primary    Macrobid prescribed. Urine culture sent. Increase water intake.       Relevant Medications   nitrofurantoin, macrocrystal-monohydrate, (MACROBID) 100 MG capsule   Other Relevant Orders   Urine Culture     Other   Bilateral knee pain    Followed by orthopedist       Obesity (BMI 30.0-34.9)    Counseling on diet. Consider Wegovy or Ozempic pending Hgb A1c results. Follow up in 4 weeks or sooner if needed.       Relevant Orders   CBC with Differential/Platelet (Completed)   Comprehensive metabolic panel (Completed)   TSH (Completed)   T4, free (Completed)   Lipid panel (Completed)   Prediabetes    Recommend limiting sugar, carbohydrates. Check A1c and follow up      Relevant Orders   CBC with Differential/Platelet (Completed)   Comprehensive metabolic panel (Completed)   TSH (Completed)   T4, free (Completed)   Hemoglobin A1c (Completed)   Other Visit Diagnoses     Urinary frequency       Relevant Orders   POCT urinalysis dipstick (Completed)   Encounter to establish care       Family history of elevated blood lipids       Relevant Orders   Lipid panel (Completed)       Return in about 4 weeks (around 11/03/2021).   Harland Dingwall, NP-C

## 2021-10-06 NOTE — Assessment & Plan Note (Signed)
Macrobid prescribed. Urine culture sent. Increase water intake.

## 2021-10-09 LAB — URINE CULTURE

## 2021-10-26 ENCOUNTER — Ambulatory Visit: Payer: 59 | Admitting: Podiatry

## 2021-10-26 DIAGNOSIS — Z6835 Body mass index (BMI) 35.0-35.9, adult: Secondary | ICD-10-CM | POA: Diagnosis not present

## 2021-10-26 DIAGNOSIS — Z1151 Encounter for screening for human papillomavirus (HPV): Secondary | ICD-10-CM | POA: Diagnosis not present

## 2021-10-26 DIAGNOSIS — L989 Disorder of the skin and subcutaneous tissue, unspecified: Secondary | ICD-10-CM | POA: Diagnosis not present

## 2021-10-26 DIAGNOSIS — Z124 Encounter for screening for malignant neoplasm of cervix: Secondary | ICD-10-CM | POA: Diagnosis not present

## 2021-10-26 DIAGNOSIS — Z01419 Encounter for gynecological examination (general) (routine) without abnormal findings: Secondary | ICD-10-CM | POA: Diagnosis not present

## 2021-10-26 NOTE — Progress Notes (Signed)
   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    Subjective: 61 y.o. female presenting to the office today as a new patient for evaluation of symptomatic calluses to the bilateral forefoot.  Patient states they are extremely painful.  She actually took a week off of work this week just to rest her feet.  She presents for further treatment and evaluation   Past Medical History:  Diagnosis Date   Allergy    Environmental allergies     Past Surgical History:  Procedure Laterality Date   COLONOSCOPY  10/12/2011   Pyrtle   OVARIAN CYST REMOVAL     POLYPECTOMY     TUBAL LIGATION  1985    No Known Allergies   Objective:  Physical Exam General: Alert and oriented x3 in no acute distress  Dermatology: Hyperkeratotic lesion(s) present on the weightbearing surface of the bilateral feet. Pain on palpation with a central nucleated core noted. Skin is warm, dry and supple bilateral lower extremities. Negative for open lesions or macerations.  Vascular: Palpable pedal pulses bilaterally. No edema or erythema noted. Capillary refill within normal limits.  Neurological: Epicritic and protective threshold grossly intact bilaterally.   Musculoskeletal Exam: Pain on palpation at the keratotic lesion(s) noted. Range of motion within normal limits bilateral. Muscle strength 5/5 in all groups bilateral.  Assessment: 1.  Symptomatic callus; benign skin lesion bilateral  Plan of Care:  1. Patient evaluated 2. Excisional debridement of keratoic lesion(s) using a chisel blade was performed without incident.  3.  Salicylic acid applied with a light dressing.. 4.  Salicylic acid provided for the patient to apply daily as tolerated x4 weeks  5.  Patient is to return to the clinic 4 weeks for follow-up  *Works at [employer] M. Camylle Whicker,  DPM [facility]  Dr. Edrick Kins, DPM    [address], [place] [address]                Office [phone]  Fax [phone]

## 2021-10-28 LAB — HM PAP SMEAR: HPV, high-risk: NEGATIVE

## 2021-10-29 ENCOUNTER — Other Ambulatory Visit (HOSPITAL_COMMUNITY): Payer: Self-pay

## 2021-10-29 ENCOUNTER — Ambulatory Visit: Payer: 59 | Admitting: Family Medicine

## 2021-10-29 ENCOUNTER — Telehealth: Payer: Self-pay | Admitting: Family Medicine

## 2021-10-29 ENCOUNTER — Encounter: Payer: Self-pay | Admitting: Family Medicine

## 2021-10-29 VITALS — BP 132/82 | HR 87 | Temp 97.6°F | Ht 72.0 in | Wt 250.0 lb

## 2021-10-29 DIAGNOSIS — Z1231 Encounter for screening mammogram for malignant neoplasm of breast: Secondary | ICD-10-CM | POA: Diagnosis not present

## 2021-10-29 DIAGNOSIS — R7303 Prediabetes: Secondary | ICD-10-CM

## 2021-10-29 DIAGNOSIS — Z23 Encounter for immunization: Secondary | ICD-10-CM | POA: Diagnosis not present

## 2021-10-29 DIAGNOSIS — E669 Obesity, unspecified: Secondary | ICD-10-CM

## 2021-10-29 MED ORDER — WEGOVY 0.25 MG/0.5ML ~~LOC~~ SOAJ
0.2500 mg | SUBCUTANEOUS | 0 refills | Status: DC
  Filled 2021-10-29 – 2022-03-15 (×3): qty 2, 28d supply, fill #0

## 2021-10-29 NOTE — Progress Notes (Signed)
Subjective:     Patient ID: Mary Burton, female    DOB: 1960/10/21, 61 y.o.   MRN: [id number]  Chief Complaint  Patient presents with   Follow-up    3 week f/u [facility]    HPI Patient is in today requesting to start on Wegovy for weight loss.   No hx of thyroid cancer or pancreatitis.  Does not drink alcohol.   Denies fever, chills, dizziness, chest pain, palpitations, shortness of breath, abdominal pain, N/V/D, urinary symptoms, LE edema.    Health Maintenance Due  Topic Date Due   Zoster Vaccines- Shingrix (1 of 2) Never done    Past Medical History:  Diagnosis Date   Allergy    Environmental allergies     Past Surgical History:  Procedure Laterality Date   COLONOSCOPY  10/12/2011   Pyrtle   OVARIAN CYST REMOVAL     POLYPECTOMY     TUBAL LIGATION  1985    Family History  Problem Relation Age of Onset   Colon cancer Mother        unsure age   Hypertension Father    Early death Sister    High blood pressure Sister    Colon polyps Neg Hx    Esophageal cancer Neg Hx    Rectal cancer Neg Hx    Stomach cancer Neg Hx     Social History   Socioeconomic History   Marital status: Single    Spouse name: Not on file   Number of children: Not on file   Years of education: Not on file   Highest education level: Not on file  Occupational History   Not on file  Tobacco Use   Smoking status: Never   Smokeless tobacco: Never  Vaping Use   Vaping Use: Never used  Substance and Sexual Activity   Alcohol use: No   Drug use: No   Sexual activity: Yes    Birth control/protection: None  Other Topics Concern   Not on file  Social History Narrative   Not on file   Social Determinants of Health   Financial Resource Strain: Not on file  Food Insecurity: Not on file  Transportation Needs: Not on file  Physical Activity: Not on file  Stress: Not on file  Social Connections: Not on file  Intimate Partner Violence: Not on file    Outpatient  Medications Prior to Visit  Medication Sig Dispense Refill   nitrofurantoin, macrocrystal-monohydrate, (MACROBID) 100 MG capsule Take 1 capsule (100 mg total) by mouth 2 (two) times daily. (Patient not taking: Reported on 10/26/2021) 10 capsule 0   No facility-administered medications prior to visit.    No Known Allergies  ROS Pertinent positives and negatives in the history of present illness.     Objective:    Physical Exam  BP 132/82 (BP Location: Left Arm, Patient Position: Sitting, Cuff Size: Large)   Pulse 87   Temp 97.6 F (36.4 C) (Temporal)   Ht 6' (1.829 m)   Wt 250 lb (113.4 kg)   LMP 05/22/2011   SpO2 99%   BMI 33.91 kg/m  Wt Readings from Last 3 Encounters:  10/29/21 250 lb (113.4 kg)  10/06/21 249 lb (112.9 kg)  01/20/20 254 lb (115.2 kg)   Alert and oriented and in no acute distress.  Not otherwise examined.     Assessment & Plan:   Problem List Items Addressed This Visit       Other   Obesity (BMI 30.0-34.9) -  Primary   Relevant Medications   Semaglutide-Weight Management (WEGOVY) 0.25 MG/0.5ML SOAJ   Prediabetes   Other Visit Diagnoses     Need for influenza vaccination       Relevant Orders   Flu Vaccine QUAD 57moIM (Fluarix, Fluzone & Alfiuria Quad PF) (Completed)      Reviewed labs from her previous visit on 10/06/2021 which were normal except for prediabetes with a hemoglobin A1c of 6.0%. In-depth counseling on Wegovy including how to take the medication, how the medication works and potential side effects.  Advised her to stay well-hydrated and to make sure she is not skipping meals.  She will need to eat small frequent amounts of food throughout the day.  She will follow-up here in 4 weeks  I have discontinued BWandra Arthurs Hanzlik's nitrofurantoin (macrocrystal-monohydrate). I am also having her start on Wegovy.  Meds ordered this encounter  Medications   Semaglutide-Weight Management (WEGOVY) 0.25 MG/0.5ML SOAJ    Sig: Inject 0.25 mg  into the skin once a week.    Dispense:  2 mL    Refill:  0    Order Specific Question:   Supervising Provider    Answer:   CPricilla HolmA [[[id number]]

## 2021-10-29 NOTE — Telephone Encounter (Signed)
Mary Burton not available until January, pt is inquiring if there is anything else you would like to prescribe?

## 2021-10-29 NOTE — Patient Instructions (Signed)
Stay well hydrated and eat small amounts throughout the day such as a cup of yogurt, 1/2 sandwich, eggs, etc.   Follow up in 4 weeks or sooner if needed.    Semaglutide Injection (Weight Management) What is this medication? SEMAGLUTIDE (SEM a GLOO tide) promotes weight loss. It may also be used to maintain weight loss. It works by decreasing appetite. Changes to diet and exercise are often combined with this medication. This medicine may be used for other purposes; ask your health care provider or pharmacist if you have questions. COMMON BRAND NAME(S): FWYOVZ What should I tell my care team before I take this medication? They need to know if you have any of these conditions: Endocrine tumors (MEN 2) or if someone in your family had these tumors Eye disease, vision problems Gallbladder disease History of depression or mental health disease History of pancreatitis Kidney disease Stomach or intestine problems Suicidal thoughts, plans, or attempt; a previous suicide attempt by you or a family member Thyroid cancer or if someone in your family had thyroid cancer An unusual or allergic reaction to semaglutide, other medications, foods, dyes, or preservatives Pregnant or trying to get pregnant Breast-feeding How should I use this medication? This medication is injected under the skin. You will be taught how to prepare and give it. Take it as directed on the prescription label. It is given once every week (every 7 days). Keep taking it unless your care team tells you to stop. It is important that you put your used needles and pens in a special sharps container. Do not put them in a trash can. If you do not have a sharps container, call your pharmacist or care team to get one. A special MedGuide will be given to you by the pharmacist with each prescription and refill. Be sure to read this information carefully each time. This medication comes with INSTRUCTIONS FOR USE. Ask your pharmacist for  directions on how to use this medication. Read the information carefully. Talk to your pharmacist or care team if you have questions. Talk to your care team about the use of this medication in children. While it may be prescribed for children as young as 12 years for selected conditions, precautions do apply. Overdosage: If you think you have taken too much of this medicine contact a poison control center or emergency room at once. NOTE: This medicine is only for you. Do not share this medicine with others. What if I miss a dose? If you miss a dose and the next scheduled dose is more than 2 days away, take the missed dose as soon as possible. If you miss a dose and the next scheduled dose is less than 2 days away, do not take the missed dose. Take the next dose at your regular time. Do not take double or extra doses. If you miss your dose for 2 weeks or more, take the next dose at your regular time or call your care team to talk about how to restart this medication. What may interact with this medication? Insulin and other medications for diabetes This list may not describe all possible interactions. Give your health care provider a list of all the medicines, herbs, non-prescription drugs, or dietary supplements you use. Also tell them if you smoke, drink alcohol, or use illegal drugs. Some items may interact with your medicine. What should I watch for while using this medication? Visit your care team for regular checks on your progress. It may be some time  before you see the benefit from this medication. Drink plenty of fluids while taking this medication. Check with your care team if you have severe diarrhea, nausea, and vomiting, or if you sweat a lot. The loss of too much body fluid may make it dangerous for you to take this medication. This medication may affect blood sugar levels. Ask your care team if changes in diet or medications are needed if you have diabetes. If you or your family notice any  changes in your behavior, such as new or worsening depression, thoughts of harming yourself, anxiety, other unusual or disturbing thoughts, or memory loss, call your care team right away. Women should inform their care team if they wish to become pregnant or think they might be pregnant. Losing weight while pregnant is not advised and may cause harm to the unborn child. Talk to your care team for more information. What side effects may I notice from receiving this medication? Side effects that you should report to your care team as soon as possible: Allergic reactions--skin rash, itching, hives, swelling of the face, lips, tongue, or throat Change in vision Dehydration--increased thirst, dry mouth, feeling faint or lightheaded, headache, dark yellow or brown urine Gallbladder problems--severe stomach pain, nausea, vomiting, fever Heart palpitations--rapid, pounding, or irregular heartbeat Kidney injury--decrease in the amount of urine, swelling of the ankles, hands, or feet Pancreatitis--severe stomach pain that spreads to your back or gets worse after eating or when touched, fever, nausea, vomiting Thoughts of suicide or self-harm, worsening mood, feelings of depression Thyroid cancer--new mass or lump in the neck, pain or trouble swallowing, trouble breathing, hoarseness Side effects that usually do not require medical attention (report to your care team if they continue or are bothersome): Diarrhea Loss of appetite Nausea Stomach pain Vomiting This list may not describe all possible side effects. Call your doctor for medical advice about side effects. You may report side effects to FDA at 1-800-FDA-1088. Where should I keep my medication? Keep out of the reach of children and pets. Refrigeration (preferred): Store in the refrigerator. Do not freeze. Keep this medication in the original container until you are ready to take it. Get rid of any unused medication after the expiration date. Room  temperature: If needed, prior to cap removal, the pen can be stored at room temperature for up to 28 days. Protect from light. If it is stored at room temperature, get rid of any unused medication after 28 days or after it expires, whichever is first. It is important to get rid of the medication as soon as you no longer need it or it is expired. You can do this in two ways: Take the medication to a medication take-back program. Check with your pharmacy or law enforcement to find a location. If you cannot return the medication, follow the directions in the [place]. NOTE: This sheet is a summary. It may not cover all possible information. If you have questions about this medicine, talk to your doctor, pharmacist, or health care provider.  2023 Elsevier/Gold Standard (2020-04-09 00:00:00)

## 2021-10-29 NOTE — Telephone Encounter (Signed)
Patient called and said that [facility] will not be available until the 1st of the new year.  Patient wants to know if there is something else you would like to prescribe.

## 2021-11-01 NOTE — Telephone Encounter (Signed)
Patient called back this morning, and gave message below. Patient verbalized understanding and will call back at the beginning of the year.

## 2021-12-21 ENCOUNTER — Telehealth: Payer: Self-pay | Admitting: Podiatry

## 2021-12-21 NOTE — Telephone Encounter (Signed)
Pt called stating she needed an appt to see her doctor on Monday 11.20. She normally see's Dr Amalia Hailey and he is out of the office next week. She was ok with seeing a different provider for her left foot pain that has came back. She stated that Dr Amalia Hailey was to be giving her something to put in her shoe that she needed to bring her work shoes to the next appt. Pt is going to do this but nothing in the note about this. Please advise so when pt comes in to see the other provider he knows what is needed.

## 2021-12-27 ENCOUNTER — Ambulatory Visit: Payer: 59 | Admitting: Podiatry

## 2021-12-27 ENCOUNTER — Encounter: Payer: Self-pay | Admitting: Podiatry

## 2021-12-27 DIAGNOSIS — M7752 Other enthesopathy of left foot: Secondary | ICD-10-CM

## 2021-12-27 DIAGNOSIS — Q828 Other specified congenital malformations of skin: Secondary | ICD-10-CM

## 2021-12-27 MED ORDER — TRIAMCINOLONE ACETONIDE 10 MG/ML IJ SUSP
10.0000 mg | Freq: Once | INTRAMUSCULAR | Status: AC
  Administered 2021-12-27: 10 mg

## 2021-12-28 NOTE — Progress Notes (Signed)
Subjective:   Patient ID: Mary Burton, female   DOB: 61 y.o.   MRN: [id number]   HPI Patient presents with a lot of pain in the plantar lateral aspect of the left foot stating its been inflamed and that she had it trimmed but that was not affected   ROS      Objective:  Physical Exam  Neurovascular status intact with inflammation around the fifth MPJ left with lesion formation that is part of this with fluid buildup     Assessment:  Inflammatory capsulitis of the fifth MPJ plantar left fluid buildup along with lesion formation painful     Plan:  H&P reviewed condition sterile prep and injected the plantar capsule left 3 mg Kenalog 5 mg Xylocaine and debrided lesion again reappoint as symptoms indicate.  This is a difficult problem with no obvious good solution but hopefully this will provide better relief of symptoms

## 2022-01-14 ENCOUNTER — Ambulatory Visit: Payer: 59 | Admitting: Family Medicine

## 2022-01-14 ENCOUNTER — Encounter: Payer: Self-pay | Admitting: Family Medicine

## 2022-01-14 ENCOUNTER — Other Ambulatory Visit (HOSPITAL_COMMUNITY): Payer: Self-pay

## 2022-01-14 VITALS — BP 110/78 | HR 88 | Temp 97.7°F | Ht 72.0 in

## 2022-01-14 DIAGNOSIS — S161XXA Strain of muscle, fascia and tendon at neck level, initial encounter: Secondary | ICD-10-CM

## 2022-01-14 MED ORDER — METHOCARBAMOL 500 MG PO TABS
500.0000 mg | ORAL_TABLET | Freq: Three times a day (TID) | ORAL | 0 refills | Status: DC | PRN
  Filled 2022-01-14 – 2022-01-15 (×2): qty 30, 10d supply, fill #0

## 2022-01-14 MED ORDER — DICLOFENAC SODIUM 1 % EX GEL
2.0000 g | Freq: Four times a day (QID) | CUTANEOUS | 1 refills | Status: DC
  Filled 2022-01-14: qty 150, 15d supply, fill #0

## 2022-01-14 NOTE — Patient Instructions (Signed)
Take Tylenol 1000 mg twice daily or ibuprofen 800 mg 3 times daily or 2 Aleve twice daily with food.  Use a heating pad or an ice pack.  Use the Voltaren gel as prescribed.  Take the muscle relaxant as needed but be aware this medication is sedating so do not drive or operate machinery with it.  Follow-up if is not improving in the next couple of weeks.

## 2022-01-14 NOTE — Progress Notes (Signed)
Subjective:     Patient ID: Mary Burton, female    DOB: 03/16/1960, 61 y.o.   MRN: [id number]  Chief Complaint  Patient presents with   Back Pain   Neck Pain    2 weeks ago, may have hurt herself picking a patient up    HPI Patient is in today for a 2 wk hx of right sided neck and shoulder pain. States she has to do a lot of lifting with her job and thinks she may have injured herself.  No numbness, tingling or weakness. No fever, chills.   Health Maintenance Due  Topic Date Due   COVID-19 Vaccine (1) Never done   Zoster Vaccines- Shingrix (1 of 2) Never done   PAP SMEAR-Modifier  02/23/2014    Past Medical History:  Diagnosis Date   Allergy    Environmental allergies     Past Surgical History:  Procedure Laterality Date   COLONOSCOPY  10/12/2011   Pyrtle   OVARIAN CYST REMOVAL     POLYPECTOMY     TUBAL LIGATION  1985    Family History  Problem Relation Age of Onset   Colon cancer Mother        unsure age   Hypertension Father    Early death Sister    High blood pressure Sister    Colon polyps Neg Hx    Esophageal cancer Neg Hx    Rectal cancer Neg Hx    Stomach cancer Neg Hx     Social History   Socioeconomic History   Marital status: Single    Spouse name: Not on file   Number of children: Not on file   Years of education: Not on file   Highest education level: Not on file  Occupational History   Not on file  Tobacco Use   Smoking status: Never   Smokeless tobacco: Never  Vaping Use   Vaping Use: Never used  Substance and Sexual Activity   Alcohol use: No   Drug use: No   Sexual activity: Yes    Birth control/protection: None  Other Topics Concern   Not on file  Social History Narrative   Not on file   Social Determinants of Health   Financial Resource Strain: Not on file  Food Insecurity: Not on file  Transportation Needs: Not on file  Physical Activity: Not on file  Stress: Not on file  Social Connections: Not on file   Intimate Partner Violence: Not on file    Outpatient Medications Prior to Visit  Medication Sig Dispense Refill   Semaglutide-Weight Management (WEGOVY) 0.25 MG/0.5ML SOAJ Inject 0.25 mg into the skin once a week. (Patient not taking: Reported on 01/14/2022) 2 mL 0   No facility-administered medications prior to visit.    No Known Allergies  ROS     Objective:    Physical Exam Constitutional:      General: She is not in acute distress.    Appearance: She is not ill-appearing.  Cardiovascular:     Rate and Rhythm: Normal rate.  Pulmonary:     Effort: Pulmonary effort is normal.  Musculoskeletal:     Right shoulder: No deformity. Normal range of motion. Normal strength. Normal pulse.     Right upper arm: Normal.     Cervical back: Neck supple. Tenderness present. Pain with movement and muscular tenderness present.       Back:     Comments: Right upper trapezius TTP and pain with head rotation  and bending to the right.   Skin:    General: Skin is warm and dry.  Neurological:     General: No focal deficit present.     Mental Status: She is alert and oriented to person, place, and time.     Cranial Nerves: Cranial nerves 2-12 are intact.     Sensory: Sensation is intact.     Motor: Motor function is intact.     BP 110/78 (BP Location: Left Arm, Patient Position: Sitting, Cuff Size: Large)   Pulse 88   Temp 97.7 F (36.5 C) (Temporal)   Ht 6' (1.829 m)   LMP 05/22/2011   SpO2 97%   BMI 33.91 kg/m  Wt Readings from Last 3 Encounters:  10/29/21 250 lb (113.4 kg)  10/06/21 249 lb (112.9 kg)  01/20/20 254 lb (115.2 kg)        Assessment & Plan:   Problem List Items Addressed This Visit   None Visit Diagnoses     Neck strain, initial encounter    -  Primary   Relevant Medications   diclofenac Sodium (VOLTAREN) 1 % GEL   methocarbamol (ROBAXIN) 500 MG tablet      No red flag symptoms.  Recommend conservative treatment.  Tylenol or NSAIDs, ice or heat,  topical analgesic as prescribed and Robaxin prn   I am having [name] start on diclofenac Sodium and methocarbamol. I am also having her maintain her Wegovy.  Meds ordered this encounter  Medications   diclofenac Sodium (VOLTAREN) 1 % GEL    Sig: Apply 2 g topically 4 (four) times daily.    Dispense:  150 g    Refill:  1    Order Specific Question:   Supervising Provider    Answer:   Pricilla Holm A [[id number]]   methocarbamol (ROBAXIN) 500 MG tablet    Sig: Take 1 tablet (500 mg total) by mouth every 8 (eight) hours as needed for muscle spasms.    Dispense:  30 tablet    Refill:  0    Order Specific Question:   Supervising Provider    Answer:   Pricilla Holm A [[id number]]

## 2022-01-15 ENCOUNTER — Other Ambulatory Visit (HOSPITAL_COMMUNITY): Payer: Self-pay

## 2022-01-17 ENCOUNTER — Other Ambulatory Visit (HOSPITAL_COMMUNITY): Payer: Self-pay

## 2022-02-22 ENCOUNTER — Ambulatory Visit: Payer: Commercial Managed Care - PPO | Admitting: Orthopaedic Surgery

## 2022-02-22 ENCOUNTER — Ambulatory Visit: Payer: Self-pay

## 2022-02-22 DIAGNOSIS — M25512 Pain in left shoulder: Secondary | ICD-10-CM | POA: Diagnosis not present

## 2022-02-22 NOTE — Progress Notes (Signed)
Office Visit Note   Patient: Mary Burton           Date of Birth: 09-14-1960           MRN: [id number] Visit Date: 02/22/2022              Requested by: Girtha Rm, NP-C [place],  [place] [id number] PCP: Girtha Rm, NP-C   Assessment & Plan: Visit Diagnoses:  1. Left shoulder pain, unspecified chronicity     Plan: Subacromial injection performed which she tolerated well.  Improved range of motion and decreased pain postinjection she will let us know if she is having ongoing problems we can either consider therapy referral or diagnostic imaging based on her symptoms.  Follow-Up Instructions: No follow-ups on file.   Orders:  Orders Placed This Encounter  Procedures   XR Shoulder Left   No orders of the defined types were placed in this encounter.     Procedures: Large Joint Inj: L subacromial bursa on 02/23/2022 7:43 PM Indications: pain Details: 22 G 1.5 in needle, lateral approach  Arthrogram: No  Medications: 4 mL bupivacaine 0.25 %; 40 mg methylPREDNISolone acetate 40 MG/ML; 0.5 mL lidocaine 1 % Outcome: tolerated well, no immediate complications Procedure, treatment alternatives, risks and benefits explained, specific risks discussed. Consent was given by the patient. Immediately prior to procedure a time out was called to verify the correct patient, procedure, equipment, support staff and site/side marked as required. Patient was prepped and draped in the usual sterile fashion.       Clinical Data: No additional findings.   Subjective: Chief Complaint  Patient presents with   Left Shoulder - Pain    HPI 62 year old female seen with left shoulder pain severe that started just a few days ago with decreased range of motion she has great difficulty trying to get her arm up overhead no numbness or tingling in her fingers no associated neck pain.  She does not recall any specific injury to her shoulder no problems with adhesive  capsulitis in the past.  Review of Systems negative for cardiac disease previous stroke.  Patient prediabetic.  All other systems noncontributory to HPI.   Objective: Vital Signs: LMP 05/22/2011   Physical Exam Constitutional:      Appearance: She is well-developed.  HENT:     Head: Normocephalic.     Right Ear: External ear normal.     Left Ear: External ear normal. There is no impacted cerumen.  Eyes:     Pupils: Pupils are equal, round, and reactive to light.  Neck:     Thyroid: No thyromegaly.     Trachea: No tracheal deviation.  Cardiovascular:     Rate and Rhythm: Normal rate.  Pulmonary:     Effort: Pulmonary effort is normal.  Abdominal:     Palpations: Abdomen is soft.  Musculoskeletal:     Cervical back: No rigidity.  Skin:    General: Skin is warm and dry.  Neurological:     Mental Status: She is alert and oriented to person, place, and time.  Psychiatric:        Behavior: Behavior normal.     Ortho Exam patient is positive impingement left shoulder negative drop arm test.  Long head of the biceps is minimally tender.  No brachial plexus tenderness negative Spurling.  Sensation in his hand is intact.  Normal gait.  Specialty Comments:  No specialty comments available.  Imaging: No results found.  PMFS History: Patient Active Problem List   Diagnosis Date Noted   Acute cystitis with hematuria 10/06/2021   Prediabetes 10/06/2021   Obesity (BMI 30.0-34.9) 10/06/2021   Impingement syndrome of right shoulder 07/02/2021   Bilateral foot pain 04/02/2014   Bilateral swelling of feet 06/06/2011   Seasonal allergies 06/06/2011   Bilateral knee pain 06/06/2011   Past Medical History:  Diagnosis Date   Allergy    Environmental allergies     Family History  Problem Relation Age of Onset   Colon cancer Mother        unsure age   Hypertension Father    Early death Sister    High blood pressure Sister    Colon polyps Neg Hx    Esophageal cancer Neg  Hx    Rectal cancer Neg Hx    Stomach cancer Neg Hx     Past Surgical History:  Procedure Laterality Date   COLONOSCOPY  10/12/2011   Pyrtle   OVARIAN CYST REMOVAL     POLYPECTOMY     TUBAL LIGATION  1985   Social History   Occupational History   Not on file  Tobacco Use   Smoking status: Never   Smokeless tobacco: Never  Vaping Use   Vaping Use: Never used  Substance and Sexual Activity   Alcohol use: No   Drug use: No   Sexual activity: Yes    Birth control/protection: None

## 2022-02-23 ENCOUNTER — Ambulatory Visit: Payer: Commercial Managed Care - PPO | Admitting: Physician Assistant

## 2022-02-23 DIAGNOSIS — M25512 Pain in left shoulder: Secondary | ICD-10-CM | POA: Diagnosis not present

## 2022-02-23 MED ORDER — BUPIVACAINE HCL 0.25 % IJ SOLN
4.0000 mL | INTRAMUSCULAR | Status: AC | PRN
  Administered 2022-02-23: 4 mL via INTRA_ARTICULAR

## 2022-02-23 MED ORDER — METHYLPREDNISOLONE ACETATE 40 MG/ML IJ SUSP
40.0000 mg | INTRAMUSCULAR | Status: AC | PRN
  Administered 2022-02-23: 40 mg via INTRA_ARTICULAR

## 2022-02-23 MED ORDER — LIDOCAINE HCL 1 % IJ SOLN
0.5000 mL | INTRAMUSCULAR | Status: AC | PRN
  Administered 2022-02-23: .5 mL

## 2022-03-01 ENCOUNTER — Other Ambulatory Visit (HOSPITAL_COMMUNITY): Payer: Self-pay

## 2022-03-07 ENCOUNTER — Telehealth: Payer: Self-pay | Admitting: Family Medicine

## 2022-03-07 NOTE — Telephone Encounter (Signed)
Patient called and said that her wygovy will need a PA

## 2022-03-07 NOTE — Telephone Encounter (Signed)
PA started for [facility] Key: [facility]

## 2022-03-07 NOTE — Telephone Encounter (Signed)
PA has been APPROVED effective from 03/07/2022 to 09/26/2022

## 2022-03-15 ENCOUNTER — Other Ambulatory Visit (HOSPITAL_COMMUNITY): Payer: Self-pay

## 2022-03-23 ENCOUNTER — Other Ambulatory Visit (HOSPITAL_COMMUNITY): Payer: Self-pay

## 2022-04-07 ENCOUNTER — Ambulatory Visit: Payer: Commercial Managed Care - PPO | Admitting: Physician Assistant

## 2022-04-11 ENCOUNTER — Other Ambulatory Visit: Payer: Self-pay | Admitting: Family Medicine

## 2022-04-11 ENCOUNTER — Other Ambulatory Visit (HOSPITAL_COMMUNITY): Payer: Self-pay

## 2022-04-11 DIAGNOSIS — E669 Obesity, unspecified: Secondary | ICD-10-CM

## 2022-04-11 MED ORDER — WEGOVY 0.25 MG/0.5ML ~~LOC~~ SOAJ
0.2500 mg | SUBCUTANEOUS | 0 refills | Status: DC
  Filled 2022-04-11 – 2022-04-15 (×4): qty 2, 28d supply, fill #0

## 2022-04-12 ENCOUNTER — Other Ambulatory Visit (HOSPITAL_COMMUNITY): Payer: Self-pay

## 2022-04-12 ENCOUNTER — Other Ambulatory Visit: Payer: Self-pay

## 2022-04-12 ENCOUNTER — Ambulatory Visit: Payer: Commercial Managed Care - PPO | Admitting: Family Medicine

## 2022-04-12 ENCOUNTER — Encounter: Payer: Self-pay | Admitting: Family Medicine

## 2022-04-12 VITALS — BP 110/76 | HR 86 | Temp 97.6°F | Ht 72.0 in | Wt 236.0 lb

## 2022-04-12 DIAGNOSIS — R7303 Prediabetes: Secondary | ICD-10-CM | POA: Diagnosis not present

## 2022-04-12 DIAGNOSIS — E669 Obesity, unspecified: Secondary | ICD-10-CM | POA: Diagnosis not present

## 2022-04-12 MED ORDER — METFORMIN HCL 500 MG PO TABS
500.0000 mg | ORAL_TABLET | Freq: Every day | ORAL | 1 refills | Status: DC
  Filled 2022-04-12: qty 90, 90d supply, fill #0

## 2022-04-12 NOTE — Progress Notes (Signed)
Subjective:     Patient ID: Mary Burton, female    DOB: [birth year], 62 y.o.   MRN: [id number]  Chief Complaint  Patient presents with   Obesity    Wegovy f/u, needs refill    HPI Patient is in today for refill of Wegovy.  States she has been taking this for 4 weeks and no significant side effects.   States she has lost weight and doing well on the medication.   States her employer has decided they will no longer cover weight loss medications after April 2024.  She is curious if there is another medication that she can start taking.  She has a history of prediabetes.  Denies any neck swelling or difficulty swallowing.  No constipation.  Denies fever, chills, dizziness, chest pain, palpitations, shortness of breath, abdominal pain, N/V/D, urinary symptoms, LE edema.      Health Maintenance Due  Topic Date Due   COVID-19 Vaccine (1) Never done   Zoster Vaccines- Shingrix (1 of 2) Never done   PAP SMEAR-Modifier  02/23/2014    Past Medical History:  Diagnosis Date   Allergy    Environmental allergies     Past Surgical History:  Procedure Laterality Date   COLONOSCOPY  10/12/2011   Pyrtle   OVARIAN CYST REMOVAL     POLYPECTOMY     TUBAL LIGATION  1985    Family History  Problem Relation Age of Onset   Colon cancer Mother        unsure age   Hypertension Father    Early death Sister    High blood pressure Sister    Colon polyps Neg Hx    Esophageal cancer Neg Hx    Rectal cancer Neg Hx    Stomach cancer Neg Hx     Social History   Socioeconomic History   Marital status: Single    Spouse name: Not on file   Number of children: Not on file   Years of education: Not on file   Highest education level: Not on file  Occupational History   Not on file  Tobacco Use   Smoking status: Never   Smokeless tobacco: Never  Vaping Use   Vaping Use: Never used  Substance and Sexual Activity   Alcohol use: No   Drug use: No   Sexual activity: Yes     Birth control/protection: None  Other Topics Concern   Not on file  Social History Narrative   Not on file   Social Determinants of Health   Financial Resource Strain: Not on file  Food Insecurity: Not on file  Transportation Needs: Not on file  Physical Activity: Not on file  Stress: Not on file  Social Connections: Not on file  Intimate Partner Violence: Not on file    Outpatient Medications Prior to Visit  Medication Sig Dispense Refill   diclofenac Sodium (VOLTAREN) 1 % GEL Apply 2 g topically 4 (four) times daily. 150 g 1   methocarbamol (ROBAXIN) 500 MG tablet Take 1 tablet (500 mg total) by mouth every 8 (eight) hours as needed for muscle spasms. 30 tablet 0   Semaglutide-Weight Management (WEGOVY) 0.25 MG/0.5ML SOAJ Inject 0.25 mg into the skin once a week. 2 mL 0   No facility-administered medications prior to visit.    No Known Allergies  ROS     Objective:    Physical Exam Constitutional:      General: She is not in acute distress.  Appearance: She is not ill-appearing.  Cardiovascular:     Rate and Rhythm: Normal rate.  Pulmonary:     Effort: Pulmonary effort is normal.  Skin:    General: Skin is warm and dry.  Neurological:     General: No focal deficit present.     Mental Status: She is alert and oriented to person, place, and time.  Psychiatric:        Mood and Affect: Mood normal.        Behavior: Behavior normal.        Thought Content: Thought content normal.     BP 110/76 (BP Location: Left Arm, Patient Position: Sitting, Cuff Size: Large)   Pulse 86   Temp 97.6 F (36.4 C) (Temporal)   Ht 6' (1.829 m)   Wt 236 lb (107 kg)   LMP 05/22/2011   SpO2 98%   BMI 32.01 kg/m  Wt Readings from Last 3 Encounters:  04/12/22 236 lb (107 kg)  10/29/21 250 lb (113.4 kg)  10/06/21 249 lb (112.9 kg)       Assessment & Plan:   Problem List Items Addressed This Visit       Other   Obesity (BMI 30.0-34.9) - Primary   Relevant Medications    metFORMIN (GLUCOPHAGE) 500 MG tablet   Other Relevant Orders   CBC with Differential/Platelet   Comprehensive metabolic panel   Hemoglobin A1c   TSH   Prediabetes   Relevant Medications   metFORMIN (GLUCOPHAGE) 500 MG tablet   Other Relevant Orders   CBC with Differential/Platelet   Comprehensive metabolic panel   Hemoglobin A1c   TSH   She has been doing well on [facility] for the past 4 weeks.  Refill sent to the pharmacy.  She has lost weight.  Unfortunately her employer decided they will no longer cover this medication for weight loss after May 09, 2022..  We discussed alternatives such as starting on metformin since she does have a history of prediabetes.  She would like to proceed with this.  We discussed potential side effects of metformin. Check labs including A1c today.  Follow-up in 6 months or sooner if needed.  I am having Mary Burton start on metFORMIN. I am also having her maintain her diclofenac Sodium, methocarbamol, and Wegovy.  Meds ordered this encounter  Medications   metFORMIN (GLUCOPHAGE) 500 MG tablet    Sig: Take 1 tablet (500 mg total) by mouth daily with breakfast.    Dispense:  90 tablet    Refill:  1    Order Specific Question:   Supervising Provider    Answer:   Pricilla Holm A [id number]

## 2022-04-12 NOTE — Patient Instructions (Addendum)
Please go downstairs for labs before you leave today.  A refill of Mancel Parsons has been sent to [facility] for you.  When the medication is finished, you may start metformin daily with breakfast.  Let me know if you have any concerns or side effects.  As discussed, keep up the good work with eating a low-fat, low carbohydrate diet and try to get at least 150 minutes of physical activity each week.  I will see you back in 6 months

## 2022-04-14 ENCOUNTER — Ambulatory Visit: Payer: Commercial Managed Care - PPO | Admitting: Family Medicine

## 2022-04-14 ENCOUNTER — Other Ambulatory Visit (HOSPITAL_COMMUNITY): Payer: Self-pay

## 2022-04-15 ENCOUNTER — Other Ambulatory Visit (HOSPITAL_COMMUNITY): Payer: Self-pay

## 2022-04-18 ENCOUNTER — Other Ambulatory Visit (HOSPITAL_COMMUNITY): Payer: Self-pay

## 2022-04-19 ENCOUNTER — Other Ambulatory Visit (HOSPITAL_COMMUNITY): Payer: Self-pay

## 2022-05-20 ENCOUNTER — Telehealth: Payer: Self-pay | Admitting: Family Medicine

## 2022-05-20 ENCOUNTER — Other Ambulatory Visit (HOSPITAL_COMMUNITY): Payer: Self-pay

## 2022-05-20 DIAGNOSIS — E669 Obesity, unspecified: Secondary | ICD-10-CM

## 2022-05-20 MED ORDER — WEGOVY 0.25 MG/0.5ML ~~LOC~~ SOAJ
0.2500 mg | SUBCUTANEOUS | 1 refills | Status: DC
  Filled 2022-05-20 – 2022-05-24 (×2): qty 2, 28d supply, fill #0
  Filled 2022-06-29: qty 2, 28d supply, fill #1

## 2022-05-20 NOTE — Telephone Encounter (Signed)
Patient called and said her approval for Semaglutide-Weight Management (WEGOVY) 0.25 MG/0.5ML SOAJ was extended. Best callback is [phone].

## 2022-05-20 NOTE — Telephone Encounter (Signed)
Refill sent to pharmacy.   

## 2022-05-24 ENCOUNTER — Other Ambulatory Visit (HOSPITAL_COMMUNITY): Payer: Self-pay

## 2022-06-09 ENCOUNTER — Ambulatory Visit: Payer: Commercial Managed Care - PPO | Admitting: Physician Assistant

## 2022-06-09 ENCOUNTER — Encounter: Payer: Self-pay | Admitting: Physician Assistant

## 2022-06-09 DIAGNOSIS — M25512 Pain in left shoulder: Secondary | ICD-10-CM | POA: Diagnosis not present

## 2022-06-09 NOTE — Progress Notes (Signed)
Office Visit Note   Patient: Mary Burton           Date of Birth: 12-Jan-1961           MRN: [id number] Visit Date: 06/09/2022              Requested by: Avanell Shackleton, NP-C [address] [place],  [place] [address] PCP: Avanell Shackleton, NP-C  Chief Complaint  Patient presents with   Left Shoulder - Pain      HPI: Mary Burton comes in today complaining of pain and stiffness in her left shoulder.  She has a history of adhesive capsulitis.  She last saw Dr. Ophelia Charter and had an injection in January.  She said she was doing well till recently.  No new injury.  States her pain is moderate to severe she has been having trouble sleeping.  No fever or chills  Assessment & Plan: Visit Diagnoses:  1. Left shoulder pain, unspecified chronicity     Plan: Exam is consistent with adhesive capsulitis.  I did go forward and inject her subacromial space.  5 minutes after the injection she did have improved passive and active motion though still quite tight.  I gave her a home exercise program but I would like also like for her to engage with physical therapy.  Would like for her to see Dr. Ophelia Charter back in about 3 to 4 weeks to see how she is progressing could consider an MRI if she still has significant problems  Follow-Up Instructions: Return in about 3 weeks (around 06/30/2022).   Ortho Exam  Patient is alert, oriented, no adenopathy, well-dressed, normal affect, normal respiratory effort. Examination of the left shoulder she is neurovascular intact she has good pulses.  She has a negative drop arm test however her motion is limited.  She has active forward elevation to about 90 degrees.  Passively I can get to about 110 degrees but is very limited by pain.  She can internally rotate behind her back.  Grip strength is intact  Imaging: No results found. No images are attached to the encounter.  Labs: Lab Results  Component Value Date   HGBA1C 6.0 10/06/2021   HGBA1C 5.8 02/28/2013    LABURIC 4.3 04/02/2014     Lab Results  Component Value Date   ALBUMIN 4.0 10/06/2021   ALBUMIN 3.9 04/02/2014   ALBUMIN 4.0 12/11/2012    No results found for: "MG" No results found for: "VD25OH"  No results found for: "PREALBUMIN"    Latest Ref Rng & Units 10/06/2021    3:23 PM 04/02/2014    4:10 PM  CBC EXTENDED  WBC 4.0 - 10.5 K/uL 5.1  4.7   RBC 3.87 - 5.11 Mil/uL 4.22  4.21   Hemoglobin 12.0 - 15.0 g/dL 62.1  30.8   HCT 65.7 - 46.0 % 36.9  37.4   Platelets 150.0 - 400.0 K/uL 201.0  237   NEUT# 1.4 - 7.7 K/uL 2.9  2.7   Lymph# 0.7 - 4.0 K/uL 1.6  1.5      There is no height or weight on file to calculate BMI.  Orders:  Orders Placed This Encounter  Procedures   Ambulatory referral to Physical Therapy   No orders of the defined types were placed in this encounter.    Procedures: No procedures performed  Clinical Data: No additional findings.  ROS:  All other systems negative, except as noted in the HPI. Review of Systems  Objective: Vital  Signs: LMP 05/22/2011   Specialty Comments:  No specialty comments available.  PMFS History: Patient Active Problem List   Diagnosis Date Noted   Acute cystitis with hematuria 10/06/2021   Prediabetes 10/06/2021   Obesity (BMI 30.0-34.9) 10/06/2021   Impingement syndrome of right shoulder 07/02/2021   Bilateral foot pain 04/02/2014   Bilateral swelling of feet 06/06/2011   Seasonal allergies 06/06/2011   Bilateral knee pain 06/06/2011   Past Medical History:  Diagnosis Date   Allergy    Environmental allergies     Family History  Problem Relation Age of Onset   Colon cancer Mother        unsure age   Hypertension Father    Early death Sister    High blood pressure Sister    Colon polyps Neg Hx    Esophageal cancer Neg Hx    Rectal cancer Neg Hx    Stomach cancer Neg Hx     Past Surgical History:  Procedure Laterality Date   COLONOSCOPY  10/12/2011   Pyrtle   OVARIAN CYST REMOVAL      POLYPECTOMY     TUBAL LIGATION  1985   Social History   Occupational History   Not on file  Tobacco Use   Smoking status: Never   Smokeless tobacco: Never  Vaping Use   Vaping Use: Never used  Substance and Sexual Activity   Alcohol use: No   Drug use: No   Sexual activity: Yes    Birth control/protection: None

## 2022-06-29 ENCOUNTER — Encounter: Payer: Self-pay | Admitting: Physician Assistant

## 2022-06-29 ENCOUNTER — Other Ambulatory Visit (INDEPENDENT_AMBULATORY_CARE_PROVIDER_SITE_OTHER): Payer: Commercial Managed Care - PPO

## 2022-06-29 ENCOUNTER — Other Ambulatory Visit: Payer: Self-pay

## 2022-06-29 ENCOUNTER — Ambulatory Visit: Payer: Commercial Managed Care - PPO | Admitting: Physician Assistant

## 2022-06-29 ENCOUNTER — Other Ambulatory Visit (HOSPITAL_COMMUNITY): Payer: Self-pay

## 2022-06-29 DIAGNOSIS — M5441 Lumbago with sciatica, right side: Secondary | ICD-10-CM | POA: Diagnosis not present

## 2022-06-29 DIAGNOSIS — M25551 Pain in right hip: Secondary | ICD-10-CM

## 2022-06-29 MED ORDER — METHYLPREDNISOLONE 4 MG PO TBPK
ORAL_TABLET | ORAL | 0 refills | Status: DC
  Filled 2022-06-29: qty 21, 6d supply, fill #0

## 2022-06-29 NOTE — Progress Notes (Signed)
Office Visit Note   Patient: Mary Burton           Date of Birth: Jul 15, 1960           MRN: [id number] Visit Date: 06/29/2022              Requested by: Avanell Shackleton, NP-C [address] [place],  [place] [address] PCP: Avanell Shackleton, NP-C  Chief Complaint  Patient presents with   Right Hip - Pain      HPI: Nickolas is a pleasant 62 year old woman who I have seen in the past for shoulder pain.  She comes in today with a chief complaint of right buttock pain radiating down her right leg.  This has been going on for a few weeks.  Denies any injury.  Denies any loss of bowel or bladder control.  Denies any weakness.  Hurts her when she bends over or tries to put on her shoes.  Does describe some tingling.  No groin pain.  Describes pain as moderate  Assessment & Plan: Visit Diagnoses:  1. Pain of right hip   2. 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  Her hip x-ray is fairly benign.  I talked about trying a steroid Dosepak.  She does not have stomach issues and she is not diabetic.  She is currently taking anti-inflammatories and I told her to discontinue this.  I think she had also benefit from therapy.  I have given her referral for this as well  Follow-Up Instructions: No follow-ups on file.   Ortho Exam  Patient is alert, oriented, no adenopathy, well-dressed, normal affect, normal respiratory effort. Examination she has no tenderness over the spine no step-off.  No pain with manipulation of her right hip.  She has 5 out of 5 strength with resisted dorsiflexion and plantarflexion of her ankle extension and flexion of her legs her sensation is intact.  Mild straight leg raise.  Imaging: No results found. No images are attached to the encounter.  Labs: Lab Results  Component Value Date   HGBA1C 6.0 10/06/2021   HGBA1C 5.8 02/28/2013   LABURIC 4.3  04/02/2014     Lab Results  Component Value Date   ALBUMIN 4.0 10/06/2021   ALBUMIN 3.9 04/02/2014   ALBUMIN 4.0 12/11/2012    No results found for: "MG" No results found for: "VD25OH"  No results found for: "PREALBUMIN"    Latest Ref Rng & Units 10/06/2021    3:23 PM 04/02/2014    4:10 PM  CBC EXTENDED  WBC 4.0 - 10.5 K/uL 5.1  4.7   RBC 3.87 - 5.11 Mil/uL 4.22  4.21   Hemoglobin 12.0 - 15.0 g/dL 02.7  25.3   HCT 66.4 - 46.0 % 36.9  37.4   Platelets 150.0 - 400.0 K/uL 201.0  237   NEUT# 1.4 - 7.7 K/uL 2.9  2.7   Lymph# 0.7 - 4.0 K/uL 1.6  1.5      There is no height or weight on file to calculate BMI.  Orders:  Orders Placed This Encounter  Procedures   XR Lumbar Spine 2-3 Views   XR Pelvis 1-2 Views   Ambulatory referral to Physical Therapy   Meds ordered this encounter  Medications   methylPREDNISolone (MEDROL DOSEPAK) 4 MG TBPK tablet    Sig: Take as directed per package instructions.  Dispense:  21 tablet    Refill:  0     Procedures: No procedures performed  Clinical Data: No additional findings.  ROS:  All other systems negative, except as noted in the HPI. Review of Systems  Objective: Vital Signs: LMP 05/22/2011   Specialty Comments:  No specialty comments available.  PMFS History: Patient Active Problem List   Diagnosis Date Noted   Acute cystitis with hematuria 10/06/2021   Prediabetes 10/06/2021   Obesity (BMI 30.0-34.9) 10/06/2021   Impingement syndrome of right shoulder 07/02/2021   Bilateral foot pain 04/02/2014   Bilateral swelling of feet 06/06/2011   Seasonal allergies 06/06/2011   Bilateral knee pain 06/06/2011   Past Medical History:  Diagnosis Date   Allergy    Environmental allergies     Family History  Problem Relation Age of Onset   Colon cancer Mother        unsure age   Hypertension Father    Early death Sister    High blood pressure Sister    Colon polyps Neg Hx    Esophageal cancer Neg Hx    Rectal  cancer Neg Hx    Stomach cancer Neg Hx     Past Surgical History:  Procedure Laterality Date   COLONOSCOPY  10/12/2011   Pyrtle   OVARIAN CYST REMOVAL     POLYPECTOMY     TUBAL LIGATION  1985   Social History   Occupational History   Not on file  Tobacco Use   Smoking status: Never   Smokeless tobacco: Never  Vaping Use   Vaping Use: Never used  Substance and Sexual Activity   Alcohol use: No   Drug use: No   Sexual activity: Yes    Birth control/protection: None

## 2022-06-30 ENCOUNTER — Other Ambulatory Visit (HOSPITAL_COMMUNITY): Payer: Self-pay

## 2022-07-08 ENCOUNTER — Ambulatory Visit: Payer: Commercial Managed Care - PPO | Admitting: Physical Therapy

## 2022-07-11 ENCOUNTER — Telehealth: Payer: Self-pay | Admitting: Physician Assistant

## 2022-07-11 NOTE — Telephone Encounter (Signed)
Last seen by you. Please advise

## 2022-07-11 NOTE — Telephone Encounter (Signed)
Patient called. She would like something called in for pain for her. Her cb# is [phone]

## 2022-07-13 ENCOUNTER — Ambulatory Visit: Payer: Commercial Managed Care - PPO | Admitting: Physician Assistant

## 2022-07-13 ENCOUNTER — Encounter: Payer: Self-pay | Admitting: Physician Assistant

## 2022-07-13 DIAGNOSIS — G8929 Other chronic pain: Secondary | ICD-10-CM | POA: Diagnosis not present

## 2022-07-13 DIAGNOSIS — M25511 Pain in right shoulder: Secondary | ICD-10-CM

## 2022-07-13 MED ORDER — BUPIVACAINE HCL 0.25 % IJ SOLN
2.0000 mL | INTRAMUSCULAR | Status: AC | PRN
Start: 2022-07-13 — End: 2022-07-13
  Administered 2022-07-13: 2 mL via INTRA_ARTICULAR

## 2022-07-13 MED ORDER — METHYLPREDNISOLONE ACETATE 40 MG/ML IJ SUSP
80.0000 mg | INTRAMUSCULAR | Status: AC | PRN
Start: 2022-07-13 — End: 2022-07-13
  Administered 2022-07-13: 80 mg via INTRA_ARTICULAR

## 2022-07-13 MED ORDER — LIDOCAINE HCL 1 % IJ SOLN
2.0000 mL | INTRAMUSCULAR | Status: AC | PRN
Start: 2022-07-13 — End: 2022-07-13
  Administered 2022-07-13: 2 mL

## 2022-07-13 NOTE — Progress Notes (Signed)
Office Visit Note   Patient: Mary Burton           Date of Birth: 1960/04/23           MRN: [id number] Visit Date: 07/13/2022              Requested by: Avanell Shackleton, NP-C [address] [place],  [place] [address] PCP: Avanell Shackleton, NP-C  Chief Complaint  Patient presents with  . Right Shoulder - Pain      HPI: Mary Burton is a pleasant 62 year old woman who I followed in the past for bilateral shoulder pain.  She was due to start therapy on her left shoulder later this week however she has had a significant increase in pain with her right shoulder.  Denies any injuries but is very active with her job.  She says it significant enough that she cannot really raise her arm.  Denies any neck pain.  She has tried over-the-counter anti-inflammatories and topical medication without relief rates her pain as moderate to severe  Assessment & Plan: Visit Diagnoses:  1. Chronic right shoulder pain     Plan: She has had good luck with steroid injections in the past.  I will go forward with 1 today however given that this is been going on a year as I first saw her for this in 2023 and she is having acute exacerbations.  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  Will also call her in a small amount of tramadol she knows only take this before bed and only when she really needs  Follow-Up Instructions: After MRI  Ortho Exam  Patient is alert, oriented, no adenopathy, well-dressed, normal affect, normal respiratory effort. Right shoulder she has no redness no erythema no signs of infection she has no neck pain grip strength is intact.  She has active forward elevation to about 110 degrees.  Then she uses her other arm for assistance.  I can get her to about 130 degrees but she has quite a bit of pain.  She does not want to internally rotate behind her back cannot pressure here she  tells me she has a positive empty can test.  She has difficulty with strength more related to pain.  Imaging: No results found. No images are attached to the encounter.  Labs: Lab Results  Component Value Date   HGBA1C 6.0 10/06/2021   HGBA1C 5.8 02/28/2013   LABURIC 4.3 04/02/2014     Lab Results  Component Value Date   ALBUMIN 4.0 10/06/2021   ALBUMIN 3.9 04/02/2014   ALBUMIN 4.0 12/11/2012    No results found for: "MG" No results found for: "VD25OH"  No results found for: "PREALBUMIN"    Latest Ref Rng & Units 10/06/2021    3:23 PM 04/02/2014    4:10 PM  CBC EXTENDED  WBC 4.0 - 10.5 K/uL 5.1  4.7   RBC 3.87 - 5.11 Mil/uL 4.22  4.21   Hemoglobin 12.0 - 15.0 g/dL 19.1  47.8   HCT 29.5 - 46.0 % 36.9  37.4   Platelets 150.0 - 400.0 K/uL 201.0  237   NEUT# 1.4 - 7.7 K/uL 2.9  2.7   Lymph# 0.7 - 4.0 K/uL 1.6  1.5      There is no height or weight on file to calculate BMI.  Orders:  Orders Placed This  Encounter  Procedures  . MR SHOULDER RIGHT WO CONTRAST   No orders of the defined types were placed in this encounter.    Procedures: Large Joint Inj: R subacromial bursa on 07/13/2022 4:29 PM Indications: diagnostic evaluation and pain Details: 25 G 1.5 in needle, posterior approach  Arthrogram: No  Medications: 2 mL lidocaine 1 %; 80 mg methylPREDNISolone acetate 40 MG/ML; 2 mL bupivacaine 0.25 % Outcome: tolerated well, no immediate complications Procedure, treatment alternatives, risks and benefits explained, specific risks discussed. Consent was given by the patient.    Clinical Data: No additional findings.  ROS:  All other systems negative, except as noted in the HPI. Review of Systems  Objective: Vital Signs: LMP 05/22/2011   Specialty Comments:  No specialty comments available.  PMFS History: Patient Active Problem List   Diagnosis Date Noted  . Acute cystitis with hematuria 10/06/2021  . Prediabetes 10/06/2021  . Obesity (BMI 30.0-34.9)  10/06/2021  . Impingement syndrome of right shoulder 07/02/2021  . Bilateral foot pain 04/02/2014  . Bilateral swelling of feet 06/06/2011  . Seasonal allergies 06/06/2011  . Bilateral knee pain 06/06/2011   Past Medical History:  Diagnosis Date  . Allergy   . Environmental allergies     Family History  Problem Relation Age of Onset  . Colon cancer Mother        unsure age  . Hypertension Father   . Early death Sister   . High blood pressure Sister   . Colon polyps Neg Hx   . Esophageal cancer Neg Hx   . Rectal cancer Neg Hx   . Stomach cancer Neg Hx     Past Surgical History:  Procedure Laterality Date  . COLONOSCOPY  10/12/2011   Pyrtle  . OVARIAN CYST REMOVAL    . POLYPECTOMY    . TUBAL LIGATION  1985   Social History   Occupational History  . Not on file  Tobacco Use  . Smoking status: Never  . Smokeless tobacco: Never  Vaping Use  . Vaping Use: Never used  Substance and Sexual Activity  . Alcohol use: No  . Drug use: No  . Sexual activity: Yes    Birth control/protection: None

## 2022-07-14 ENCOUNTER — Telehealth: Payer: Self-pay | Admitting: Physician Assistant

## 2022-07-14 ENCOUNTER — Other Ambulatory Visit: Payer: Self-pay | Admitting: Orthopedic Surgery

## 2022-07-14 ENCOUNTER — Other Ambulatory Visit (HOSPITAL_COMMUNITY): Payer: Self-pay

## 2022-07-14 MED ORDER — TRAMADOL HCL 50 MG PO TABS
50.0000 mg | ORAL_TABLET | Freq: Four times a day (QID) | ORAL | 0 refills | Status: DC | PRN
  Filled 2022-07-14: qty 30, 8d supply, fill #0

## 2022-07-14 NOTE — Telephone Encounter (Signed)
This is a MP pt can you please see message below and advise.

## 2022-07-14 NOTE — Telephone Encounter (Signed)
Can Dr. Lajoyce Corners advise on Rx below?

## 2022-07-14 NOTE — Addendum Note (Signed)
Addended by: Polly Cobia on: 07/14/2022 08:27 AM   Modules accepted: Orders

## 2022-07-14 NOTE — Telephone Encounter (Signed)
Patient Tramadol was sent to incorrect pharmacy please resend it to [facility] pharmacy she will pick it up tomorrow

## 2022-07-15 ENCOUNTER — Ambulatory Visit: Payer: Commercial Managed Care - PPO | Admitting: Physical Therapy

## 2022-07-15 ENCOUNTER — Other Ambulatory Visit (HOSPITAL_COMMUNITY): Payer: Self-pay

## 2022-07-27 ENCOUNTER — Telehealth: Payer: Self-pay | Admitting: Physician Assistant

## 2022-07-27 NOTE — Telephone Encounter (Signed)
Spoke w/the patient, she stated that the Percocet is giving her the shakes and is making her sick.  She would like something else called in, stated she's in a lot of pain.

## 2022-07-28 ENCOUNTER — Other Ambulatory Visit (HOSPITAL_COMMUNITY): Payer: Self-pay

## 2022-07-28 ENCOUNTER — Telehealth: Payer: Self-pay | Admitting: Physician Assistant

## 2022-07-28 ENCOUNTER — Other Ambulatory Visit: Payer: Self-pay | Admitting: Physician Assistant

## 2022-07-28 ENCOUNTER — Encounter (HOSPITAL_BASED_OUTPATIENT_CLINIC_OR_DEPARTMENT_OTHER): Payer: Self-pay

## 2022-07-28 MED ORDER — ACETAMINOPHEN-CODEINE 300-30 MG PO TABS
1.0000 | ORAL_TABLET | Freq: Four times a day (QID) | ORAL | 0 refills | Status: DC | PRN
  Filled 2022-07-28: qty 20, 5d supply, fill #0

## 2022-07-28 NOTE — Telephone Encounter (Signed)
Patient called. She will take the Tylenol 3

## 2022-07-28 NOTE — Telephone Encounter (Signed)
Ok perfect. Are you going to try to reach her later or do you need to follow-up?

## 2022-07-28 NOTE — Telephone Encounter (Signed)
Wrong message, she needs something else for pain

## 2022-08-03 ENCOUNTER — Telehealth: Payer: Self-pay | Admitting: Physician Assistant

## 2022-08-03 NOTE — Telephone Encounter (Signed)
Spoke with pt to set an MRI Review appt with PA Chales Abrahams after 7/29. Ptg asked for a call back after 3:30 pm to set appt

## 2022-08-09 ENCOUNTER — Telehealth: Payer: Self-pay | Admitting: Physician Assistant

## 2022-08-09 NOTE — Telephone Encounter (Signed)
Spoke to pt and pt going to call back when she get her work schedule from both jobs for Aug. Pt has an MRI scheduled for 8/2 and need a MRI review with PA Persons after 8/2

## 2022-08-18 ENCOUNTER — Encounter: Payer: Self-pay | Admitting: Physician Assistant

## 2022-08-18 ENCOUNTER — Ambulatory Visit: Payer: Commercial Managed Care - PPO | Admitting: Physician Assistant

## 2022-08-18 ENCOUNTER — Ambulatory Visit (HOSPITAL_COMMUNITY)
Admission: EM | Admit: 2022-08-18 | Discharge: 2022-08-18 | Discharge status: Against Medical Advice | Payer: Commercial Managed Care - PPO

## 2022-08-18 ENCOUNTER — Encounter: Payer: Self-pay | Admitting: Radiology

## 2022-08-18 ENCOUNTER — Telehealth: Payer: Self-pay | Admitting: Physician Assistant

## 2022-08-18 DIAGNOSIS — M25512 Pain in left shoulder: Secondary | ICD-10-CM

## 2022-08-18 MED ORDER — METHYLPREDNISOLONE ACETATE 40 MG/ML IJ SUSP
80.0000 mg | INTRAMUSCULAR | Status: AC | PRN
Start: 2022-08-18 — End: 2022-08-18
  Administered 2022-08-18: 80 mg via INTRA_ARTICULAR

## 2022-08-18 MED ORDER — BUPIVACAINE HCL 0.25 % IJ SOLN
2.0000 mL | INTRAMUSCULAR | Status: AC | PRN
Start: 2022-08-18 — End: 2022-08-18
  Administered 2022-08-18: 2 mL via INTRA_ARTICULAR

## 2022-08-18 MED ORDER — LIDOCAINE HCL 1 % IJ SOLN
2.0000 mL | INTRAMUSCULAR | Status: AC | PRN
Start: 2022-08-18 — End: 2022-08-18
  Administered 2022-08-18: 2 mL

## 2022-08-18 NOTE — Progress Notes (Signed)
Office Visit Note   Patient: Mary Burton           Date of Birth: 1960/10/19           MRN: [id number] Visit Date: 08/18/2022              Requested by: Avanell Shackleton, NP-C [address] [place],  [place] [address] PCP: Avanell Shackleton, NP-C  Chief Complaint  Patient presents with   Left Shoulder - Pain      HPI: Mary Burton is a pleasant 62 year old woman who comes in today complaining of left shoulder pain.  She does have a history of bilateral shoulder pain in the right actually been more symptomatic than the left.  She thinks that this made the left worse because she has been using her left arm pure.  She has not been able to go to work because of the severe pain.  She has tried tramadol and Tylenol 3.  She has had a steroid injection about 10 weeks ago and it did help quite a bit.  No new injury.  Denies any fever or chills  Assessment & Plan: Visit Diagnoses:  1. Left shoulder pain, unspecified chronicity     Plan: Will go forward with a subacromial injection today.  I have encouraged her to do passive range of motion as I do think she has developed adhesive capsulitis.  Will follow-up after the MRI of her right shoulder  Follow-Up Instructions: Return if symptoms worsen or fail to improve.   Ortho Exam  Patient is alert, oriented, no adenopathy, well-dressed, normal affect, normal respiratory effort. Left shoulder no erythema no redness no swelling.  She is neurovascular intact has good grip strength.  She does have limited motion will only let me passively bring her to about 90 degrees were after this seems to not be able to go further.  She neurovascular intact strong radial pulse She has good motion overhead of her right arm though does have some wrist pain which she says is improving.  No sign of infection or cellulitis compartments are soft and compressible  Imaging: No results found. No images are attached to the encounter.  Labs: Lab Results  Component  Value Date   HGBA1C 6.0 10/06/2021   HGBA1C 5.8 02/28/2013   LABURIC 4.3 04/02/2014     Lab Results  Component Value Date   ALBUMIN 4.0 10/06/2021   ALBUMIN 3.9 04/02/2014   ALBUMIN 4.0 12/11/2012    No results found for: "MG" No results found for: "VD25OH"  No results found for: "PREALBUMIN"    Latest Ref Rng & Units 10/06/2021    3:23 PM 04/02/2014    4:10 PM  CBC EXTENDED  WBC 4.0 - 10.5 K/uL 5.1  4.7   RBC 3.87 - 5.11 Mil/uL 4.22  4.21   Hemoglobin 12.0 - 15.0 g/dL 82.9  56.2   HCT 13.0 - 46.0 % 36.9  37.4   Platelets 150.0 - 400.0 K/uL 201.0  237   NEUT# 1.4 - 7.7 K/uL 2.9  2.7   Lymph# 0.7 - 4.0 K/uL 1.6  1.5      There is no height or weight on file to calculate BMI.  Orders:  No orders of the defined types were placed in this encounter.  No orders of the defined types were placed in this encounter.    Procedures: Large Joint Inj: L subacromial bursa on 08/18/2022 1:06 PM Indications: diagnostic evaluation and pain Details: 22 G 1.5 in  needle, posterior approach  Arthrogram: No  Medications: 2 mL lidocaine 1 %; 80 mg methylPREDNISolone acetate 40 MG/ML; 2 mL bupivacaine 0.25 % Outcome: tolerated well, no immediate complications Procedure, treatment alternatives, risks and benefits explained, specific risks discussed. Consent was given by the patient.      Clinical Data: No additional findings.  ROS:  All other systems negative, except as noted in the HPI. Review of Systems  Objective: Vital Signs: LMP 05/22/2011   Specialty Comments:  No specialty comments available.  PMFS History: Patient Active Problem List   Diagnosis Date Noted   Pain in left shoulder 08/18/2022   Acute cystitis with hematuria 10/06/2021   Prediabetes 10/06/2021   Obesity (BMI 30.0-34.9) 10/06/2021   Impingement syndrome of right shoulder 07/02/2021   Bilateral foot pain 04/02/2014   Bilateral swelling of feet 06/06/2011   Seasonal allergies 06/06/2011    Bilateral knee pain 06/06/2011   Past Medical History:  Diagnosis Date   Allergy    Environmental allergies     Family History  Problem Relation Age of Onset   Colon cancer Mother        unsure age   Hypertension Father    Early death Sister    High blood pressure Sister    Colon polyps Neg Hx    Esophageal cancer Neg Hx    Rectal cancer Neg Hx    Stomach cancer Neg Hx     Past Surgical History:  Procedure Laterality Date   COLONOSCOPY  10/12/2011   Pyrtle   OVARIAN CYST REMOVAL     POLYPECTOMY     TUBAL LIGATION  1985   Social History   Occupational History   Not on file  Tobacco Use   Smoking status: Never   Smokeless tobacco: Never  Vaping Use   Vaping status: Never Used  Substance and Sexual Activity   Alcohol use: No   Drug use: No   Sexual activity: Yes    Birth control/protection: None

## 2022-08-18 NOTE — Telephone Encounter (Signed)
Pt called stating both her shoulder are hurting now and the medication PA Person prescribed is not working. Please call pt about this matter at [phone]

## 2022-08-18 NOTE — Telephone Encounter (Signed)
Scheduled

## 2022-08-22 ENCOUNTER — Encounter: Payer: Self-pay | Admitting: Physician Assistant

## 2022-08-24 ENCOUNTER — Encounter: Payer: Self-pay | Admitting: Physician Assistant

## 2022-09-05 ENCOUNTER — Other Ambulatory Visit: Payer: Commercial Managed Care - PPO

## 2022-09-08 ENCOUNTER — Encounter: Payer: Self-pay | Admitting: Physician Assistant

## 2022-09-09 ENCOUNTER — Ambulatory Visit
Admission: RE | Admit: 2022-09-09 | Discharge: 2022-09-09 | Disposition: A | Discharge status: Home / Self Care | Payer: Commercial Managed Care - PPO | Source: Ambulatory Visit | Attending: Physician Assistant | Admitting: Physician Assistant

## 2022-09-09 DIAGNOSIS — G8929 Other chronic pain: Secondary | ICD-10-CM

## 2022-09-09 DIAGNOSIS — M7551 Bursitis of right shoulder: Secondary | ICD-10-CM | POA: Diagnosis not present

## 2022-09-09 DIAGNOSIS — M25511 Pain in right shoulder: Secondary | ICD-10-CM | POA: Diagnosis not present

## 2022-09-09 DIAGNOSIS — M7581 Other shoulder lesions, right shoulder: Secondary | ICD-10-CM | POA: Diagnosis not present

## 2022-09-22 ENCOUNTER — Encounter: Payer: Self-pay | Admitting: Physician Assistant

## 2022-09-22 ENCOUNTER — Ambulatory Visit: Payer: Commercial Managed Care - PPO | Admitting: Physician Assistant

## 2022-09-22 DIAGNOSIS — M25511 Pain in right shoulder: Secondary | ICD-10-CM

## 2022-09-22 DIAGNOSIS — G8929 Other chronic pain: Secondary | ICD-10-CM

## 2022-09-22 NOTE — Progress Notes (Signed)
Office Visit Note   Patient: Mary Burton           Date of Birth: Mar 27, 1960           MRN: [id number] Visit Date: 09/22/2022              Requested by: Avanell Shackleton, NP-C [address] [place],  [place] [address] PCP: Avanell Shackleton, NP-C  Chief Complaint  Patient presents with   Right Shoulder - Follow-up    MRI review      HPI: Tyrionna is a pleasant 62 year old woman who has had ongoing difficulty with both of her shoulders.  No prior history of surgery.  At her last visit she actually visited me for the left shoulder.  I was concerned she had adhesive capsulitis.  She comes in today to review the MRI of her right shoulder which had similar symptoms.  That has gotten a little bit better.  Assessment & Plan: Visit Diagnoses:  1. Chronic right shoulder pain     Plan: MRI today raised question of severe tendinosis of supraspinatus of the right shoulder and infraspinatus and subscapularis.  She does have a moderate amount of arthropathy of the [facility] joint.  Mild partial-thickness cartilage loss of the glenohumeral joint.  Because she has had this problem waxing and waning for a while now she is concerned that it will flareup again.  Will refer her to further review the MRI with Dr. August Saucer.  I do think she does have an element of adhesive capsulitis  Follow-Up Instructions: No follow-ups on file.   Ortho Exam  Patient is alert, oriented, no adenopathy, well-dressed, normal affect, normal respiratory effort. Examination of her right shoulder she has forward elevation to about 160 degrees.  She does have internal rotation behind her back.  Abductor strength is okay.  Good grip strength.  Positive impingement findings  Imaging: No results found. No images are attached to the encounter.  Labs: Lab Results  Component Value Date   HGBA1C 6.0 10/06/2021   HGBA1C 5.8 02/28/2013   LABURIC 4.3 04/02/2014     Lab Results  Component Value Date   ALBUMIN 4.0 10/06/2021    ALBUMIN 3.9 04/02/2014   ALBUMIN 4.0 12/11/2012    No results found for: "MG" No results found for: "VD25OH"  No results found for: "PREALBUMIN"    Latest Ref Rng & Units 10/06/2021    3:23 PM 04/02/2014    4:10 PM  CBC EXTENDED  WBC 4.0 - 10.5 K/uL 5.1  4.7   RBC 3.87 - 5.11 Mil/uL 4.22  4.21   Hemoglobin 12.0 - 15.0 g/dL 19.1  47.8   HCT 29.5 - 46.0 % 36.9  37.4   Platelets 150.0 - 400.0 K/uL 201.0  237   NEUT# 1.4 - 7.7 K/uL 2.9  2.7   Lymph# 0.7 - 4.0 K/uL 1.6  1.5      There is no height or weight on file to calculate BMI.  Orders:  Orders Placed This Encounter  Procedures   Ambulatory referral to Orthopedic Surgery   No orders of the defined types were placed in this encounter.    Procedures: No procedures performed  Clinical Data: No additional findings.  ROS:  All other systems negative, except as noted in the HPI. Review of Systems  Objective: Vital Signs: LMP 05/22/2011   Specialty Comments:  No specialty comments available.  PMFS History: Patient Active Problem List   Diagnosis Date Noted   Pain  in left shoulder 08/18/2022   Acute cystitis with hematuria 10/06/2021   Prediabetes 10/06/2021   Obesity (BMI 30.0-34.9) 10/06/2021   Impingement syndrome of right shoulder 07/02/2021   Bilateral foot pain 04/02/2014   Bilateral swelling of feet 06/06/2011   Seasonal allergies 06/06/2011   Bilateral knee pain 06/06/2011   Past Medical History:  Diagnosis Date   Allergy    Environmental allergies     Family History  Problem Relation Age of Onset   Colon cancer Mother        unsure age   Hypertension Father    Early death Sister    High blood pressure Sister    Colon polyps Neg Hx    Esophageal cancer Neg Hx    Rectal cancer Neg Hx    Stomach cancer Neg Hx     Past Surgical History:  Procedure Laterality Date   COLONOSCOPY  10/12/2011   Pyrtle   OVARIAN CYST REMOVAL     POLYPECTOMY     TUBAL LIGATION  1985   Social History    Occupational History   Not on file  Tobacco Use   Smoking status: Never   Smokeless tobacco: Never  Vaping Use   Vaping status: Never Used  Substance and Sexual Activity   Alcohol use: No   Drug use: No   Sexual activity: Yes    Birth control/protection: None

## 2022-10-12 ENCOUNTER — Other Ambulatory Visit (INDEPENDENT_AMBULATORY_CARE_PROVIDER_SITE_OTHER): Payer: Commercial Managed Care - PPO

## 2022-10-12 ENCOUNTER — Encounter: Payer: Self-pay | Admitting: Orthopedic Surgery

## 2022-10-12 ENCOUNTER — Ambulatory Visit: Payer: Commercial Managed Care - PPO | Admitting: Surgical

## 2022-10-12 DIAGNOSIS — M1712 Unilateral primary osteoarthritis, left knee: Secondary | ICD-10-CM

## 2022-10-12 MED ORDER — METHYLPREDNISOLONE ACETATE 40 MG/ML IJ SUSP
40.0000 mg | INTRAMUSCULAR | Status: AC | PRN
Start: 2022-10-12 — End: 2022-10-12
  Administered 2022-10-12: 40 mg via INTRA_ARTICULAR

## 2022-10-12 MED ORDER — BUPIVACAINE HCL 0.25 % IJ SOLN
4.0000 mL | INTRAMUSCULAR | Status: AC | PRN
Start: 2022-10-12 — End: 2022-10-12
  Administered 2022-10-12: 4 mL via INTRA_ARTICULAR

## 2022-10-12 MED ORDER — LIDOCAINE HCL 1 % IJ SOLN
5.0000 mL | INTRAMUSCULAR | Status: AC | PRN
Start: 2022-10-12 — End: 2022-10-12
  Administered 2022-10-12: 5 mL

## 2022-10-12 NOTE — Progress Notes (Signed)
Office Visit Note   Patient: Mary Burton           Date of Birth: 1960/10/14           MRN: [id number] Visit Date: 10/12/2022 Requested by: Persons, [place], PA [address] [place],  [place] [address] PCP: Avanell Shackleton, NP-C  Subjective: Chief Complaint  Patient presents with   Shoulder Pain    HPI: Mary Burton is a 62 y.o. female who presents to the office for MRI review.  Patient is here for evaluation on referral from [place] persons PA-C.  She has history of right shoulder pain that she localizes to the anterolateral aspect of the shoulder.  No radiation of pain.  No numbness or tingling.  No neck pain.  Pain will occasionally wake her up from sleep at night but she has had subacromial injection by Clerance Lav that was on 07/13/2022 that provided good significant relief for her that has persisted to this day.  She is right-hand dominant.  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  No history of injury.  No history of prior surgery to the shoulder.  No scapular pain.  Had MRI ordered at her last visit that she is here to review today.  She also complains of left knee pain.  No history of left knee surgery.  Has had cortisone injections of the knee before with good relief.  No groin pain.  No radicular pain.  Knee will get stiff at times.  No fevers or chills.`                ROS: All systems reviewed are negative as they relate to the chief complaint within the history of present illness.  Patient denies fevers or chills.  Assessment & Plan: Visit Diagnoses:  1. Unilateral primary osteoarthritis, left knee     Plan: Mary Burton is a 62 y.o. female who presents to the office for review of right shoulder pain and right shoulder MRI.  Referred by [place] Persons PA-C.  She has had several subacromial injections with good relief.  These usually last for several months at a time.  Last  injection was on 07/13/2022 and she still reports continued relief.  She does have some intra-articular pathology noted with some chondral thinning and tendinosis of the bicep tendon but this does not seem to be very symptomatic for her based on lack of symptoms at this time after subacromial injection.  Excellent rotator cuff strength on exam.  No evidence of frozen shoulder.  AC joint has some arthritis but this is nontender on exam.  Plan for no further intervention regarding her right shoulder and she will reach out to the office if she would like to repeat subacromial injection in the future with Mary-Anne.  If subacromial injection fails to improve her pain in the future, could try glenohumeral injection or AC joint injection..  She also complains of left knee pain.  She had new radiographs taken today demonstrating progression of knee arthritis compared with last set of radiographs from 2018.  She would like to try cortisone injection which has historically provided good relief for her.  No history of diabetes.  Tolerated injection well without complication.  Follow-up with the office as needed.  Follow-Up Instructions: No follow-ups on file.   Orders:  Orders Placed This Encounter  Procedures   XR KNEE 3 VIEW LEFT  No orders of the defined types were placed in this encounter.     Procedures: Large Joint Inj: L knee on 10/12/2022 9:38 AM Indications: diagnostic evaluation, joint swelling and pain Details: 18 G 1.5 in needle, superolateral approach  Arthrogram: No  Medications: 5 mL lidocaine 1 %; 40 mg methylPREDNISolone acetate 40 MG/ML; 4 mL bupivacaine 0.25 % Outcome: tolerated well, no immediate complications Procedure, treatment alternatives, risks and benefits explained, specific risks discussed. Consent was given by the patient. Immediately prior to procedure a time out was called to verify the correct patient, procedure, equipment, support staff and site/side marked as required.  Patient was prepped and draped in the usual sterile fashion.       Clinical Data: No additional findings.  Objective: Vital Signs: LMP 05/22/2011   Physical Exam:  Constitutional: Patient appears well-developed HEENT:  Head: Normocephalic Eyes:EOM are normal Neck: Normal range of motion Cardiovascular: Normal rate Pulmonary/chest: Effort normal Neurologic: Patient is alert Skin: Skin is warm Psychiatric: Patient has normal mood and affect  Ortho Exam: 5 degrees extension and 95 degrees of knee flexion.  No cellulitis or skin changes.  Mild tenderness over the medial joint line.  No tenderness over the lateral joint line.  No calf tenderness.  Able to perform straight leg raise.  Mild patellofemoral crepitus noted.  Left shoulder with 40 degrees X rotation, 80 degrees abduction, 160 degrees forward elevation passively and actively.  This compared with the right shoulder with 40 degrees external rotation, 90 degrees abduction, 170 degrees forward elevation passively and actively.  Excellent rotator cuff strength of supra, infra, subscap rated 5/5 of the right shoulder.  No tenderness over the [facility] joint.  No tenderness over the bicipital groove.  Negative O'Brien sign.  Negative Neer's impingement sign in the right shoulder but positive in the left shoulder.  Intact EPL, FPL, finger abduction, pronation/supination, bicep, tricep, deltoid bilaterally.  Axillary nerve intact with deltoid firing bilaterally.  Specialty Comments:  No specialty comments available.  Imaging: No results found.   PMFS History: Patient Active Problem List   Diagnosis Date Noted   Pain in left shoulder 08/18/2022   Acute cystitis with hematuria 10/06/2021   Prediabetes 10/06/2021   Obesity (BMI 30.0-34.9) 10/06/2021   Impingement syndrome of right shoulder 07/02/2021   Bilateral foot pain 04/02/2014   Bilateral swelling of feet 06/06/2011   Seasonal allergies 06/06/2011   Bilateral knee pain  06/06/2011   Past Medical History:  Diagnosis Date   Allergy    Environmental allergies     Family History  Problem Relation Age of Onset   Colon cancer Mother        unsure age   Hypertension Father    Early death Sister    High blood pressure Sister    Colon polyps Neg Hx    Esophageal cancer Neg Hx    Rectal cancer Neg Hx    Stomach cancer Neg Hx     Past Surgical History:  Procedure Laterality Date   COLONOSCOPY  10/12/2011   Pyrtle   OVARIAN CYST REMOVAL     POLYPECTOMY     TUBAL LIGATION  1985   Social History   Occupational History   Not on file  Tobacco Use   Smoking status: Never   Smokeless tobacco: Never  Vaping Use   Vaping status: Never Used  Substance and Sexual Activity   Alcohol use: No   Drug use: No   Sexual activity: Yes    Birth control/protection:  None

## 2022-10-13 ENCOUNTER — Telehealth: Payer: Self-pay

## 2022-10-13 NOTE — Telephone Encounter (Signed)
Yeah for the left shoulder.  For the right shoulder, she can do that sooner (should be okay now) but I just saw her and I think her right shoulder is doing okay

## 2022-10-13 NOTE — Telephone Encounter (Signed)
Pt called due to needing a cortisone shot in her arm. Pt advised to give her a call. Mary Burton [phone]

## 2022-10-13 NOTE — Telephone Encounter (Signed)
She's had subacromial injections with good success with [place].  She can either see her, myself, or Dr August Saucer for this

## 2022-10-14 NOTE — Telephone Encounter (Signed)
Lvm for pt to cb to discuss  

## 2022-10-14 NOTE — Telephone Encounter (Signed)
I talked to the pt. She wanted her left shoulder injected again. I told her it was too soon. She stated understanding to this. She wanted to know if maryann had any other suggestions for her pain. She stated she is currently taking aleve and this isnt helping. Please advise

## 2022-10-17 ENCOUNTER — Telehealth: Payer: Self-pay | Admitting: Surgical

## 2022-10-17 NOTE — Telephone Encounter (Signed)
Just add her to my schedule on Wednesday and I can see her then.  I'll either do a Toradol subacromial injection or a cortisone glenohumeral injection based on what her exam is like so she should have some relief. Alternatively she could see Mary-Anne tomorrow probably if her pain is too bad to wait until Wednesday since she has seen her before

## 2022-10-17 NOTE — Telephone Encounter (Signed)
Called pt back. Appt scheduled for wed

## 2022-10-17 NOTE — Telephone Encounter (Signed)
Patient called asked if she can get another injection in her left shoulder. The number to contact patient is [phone]

## 2022-10-17 NOTE — Telephone Encounter (Signed)
I called and talked to the pt. She couldn't give me a % of relief. She stated she is just in soo much pain now. She stated she is about to be pulled out of work due to her pain. I offered her an appointment to come back in to discuss but she doesn't want to pay a $50 copay for you not to do anything for her. She wanted you to call her after 3:30 to discuss this pain and what to do. She stated she can't take it anymore. Can you please call her?

## 2022-10-17 NOTE — Telephone Encounter (Signed)
It depends on how that last injection did.  If it gave her 100% relief, I'd hold out until she can have another SA inj.  If it was more like 40 or 50% relief then we could try either [facility] joint injection or glenohumeral injection to see if that will be more helpful for her

## 2022-10-19 ENCOUNTER — Encounter: Payer: Self-pay | Admitting: Surgical

## 2022-10-19 ENCOUNTER — Other Ambulatory Visit: Payer: Self-pay

## 2022-10-19 ENCOUNTER — Ambulatory Visit: Payer: Commercial Managed Care - PPO | Admitting: Surgical

## 2022-10-19 DIAGNOSIS — M75102 Unspecified rotator cuff tear or rupture of left shoulder, not specified as traumatic: Secondary | ICD-10-CM

## 2022-10-19 DIAGNOSIS — M25512 Pain in left shoulder: Secondary | ICD-10-CM

## 2022-10-19 DIAGNOSIS — G8929 Other chronic pain: Secondary | ICD-10-CM | POA: Diagnosis not present

## 2022-10-19 DIAGNOSIS — M7522 Bicipital tendinitis, left shoulder: Secondary | ICD-10-CM

## 2022-10-20 ENCOUNTER — Telehealth: Payer: Self-pay | Admitting: Surgical

## 2022-10-20 NOTE — Telephone Encounter (Signed)
Pt called stating her shoulder is hurting worse since she had the injection. She states she can barely lift it and asking for a call back from [facility]. Please call pt at [phone].

## 2022-10-20 NOTE — Telephone Encounter (Signed)
Not surprising that it is increased in pain for a day or two after.  I'd wait a week until any concern.  If no improvement at that point, we can do subacromial injection instead

## 2022-10-20 NOTE — Telephone Encounter (Signed)
IC discussed with patient. She will reach back out to [place] on Monday if she is not any better. She will use some ice and take some OTC meds to see if this will give her some relief.

## 2022-10-21 ENCOUNTER — Encounter: Payer: Self-pay | Admitting: Surgical

## 2022-10-21 MED ORDER — BUPIVACAINE HCL 0.25 % IJ SOLN
9.0000 mL | INTRAMUSCULAR | Status: AC | PRN
Start: 2022-10-19 — End: 2022-10-19
  Administered 2022-10-19: 9 mL via INTRA_ARTICULAR

## 2022-10-21 MED ORDER — METHYLPREDNISOLONE ACETATE 40 MG/ML IJ SUSP
40.0000 mg | INTRAMUSCULAR | Status: AC | PRN
Start: 2022-10-19 — End: 2022-10-19
  Administered 2022-10-19: 40 mg via INTRA_ARTICULAR

## 2022-10-21 MED ORDER — LIDOCAINE HCL 1 % IJ SOLN
5.0000 mL | INTRAMUSCULAR | Status: AC | PRN
Start: 2022-10-19 — End: 2022-10-19
  Administered 2022-10-19: 5 mL

## 2022-10-21 NOTE — Progress Notes (Signed)
Office Visit Note   Patient: Mary Burton           Date of Birth: [birth year]           MRN: [id number] Visit Date: 10/19/2022 Requested by: Avanell Shackleton, NP-C [address] [place],  [place] [address] PCP: Avanell Shackleton, NP-C  Subjective: Chief Complaint  Patient presents with   Left Shoulder - Pain    HPI: Mary Burton is a 62 y.o. female who presents to the office reporting left shoulder pain.  She has previously seen Clerance Lav persons PA-C for this.  Prior subacromial injection on 08/18/2022 that gave her some good relief but this was fairly short-lived only lasting for several weeks or a month.  Pain has now returned and she localizes pain to the anterior and lateral aspects of the shoulder with some radiation to the mid humeral region.  No radiation further down the arm.  No significant scapular pain.  No numbness or tingling.  No fall or injury recently.  She is right-hand dominant.  Using ice and Aleve to help with pain control.  No mechanical symptoms in the shoulder.  She has had prior MRI scan of the right shoulder that was reviewed at her last appointment that demonstrated severe tendinosis of the supraspinatus with some fraying along the bursal surface as well as severe tendinosis of the long head of the bicep tendon.              ROS: All systems reviewed are negative as they relate to the chief complaint within the history of present illness.  Patient denies fevers or chills.  Assessment & Plan: Visit Diagnoses:  1. Tear of left supraspinatus tendon   2. Chronic left shoulder pain     Plan: Patient is a 62 year old female who presents for evaluation of left shoulder pain.  Has history of left shoulder pain that has been ongoing for several months that has been improved with subacromial injection by Clerance Lav persons PA-C.  This was about 2 months ago and though it was improved, it was not 100% relief and it was fairly short-lived compared with what Alea  was hoping for.  She would like to try another injection but it is too soon for subacromial injection.  Did perform ultrasound evaluation of the left shoulder today demonstrating bicep tendon intact in the bicipital groove.  Subscapularis tendon appeared intact on exam.  The anterior and mid portions of the supraspinatus tendon were intact with significant tendinosis and there was some articular sided tearing noted with cortical irregularity and hypoechoic signal.  After discussion of options, patient would like to try glenohumeral injection which may help with her articular sided pain generator which is likely from the partial-thickness rotator cuff tear and from some possible bicep tendinosis as she has on her right side.  This was administered and patient tolerated the procedure well.  We will see how this does and patient will keep [place] posted in about 1 to 2 weeks.  If no significant improvement, next step would be repeat subacromial injection versus MRI arthrogram for further evaluation of shoulder pathology.  Follow-Up Instructions: Return if symptoms worsen or fail to improve.   Orders:  Orders Placed This Encounter  Procedures   US Guided Needle Placement - No Linked Charges   No orders of the defined types were placed in this encounter.     Procedures: Large Joint Inj: L glenohumeral on 10/19/2022 8:10 AM Details: 22 G 3.5  in needle, ultrasound-guided posterior approach Medications: 5 mL lidocaine 1 %; 9 mL bupivacaine 0.25 %; 40 mg methylPREDNISolone acetate 40 MG/ML Outcome: tolerated well, no immediate complications Procedure, treatment alternatives, risks and benefits explained, specific risks discussed. Consent was given by the patient. Patient was prepped and draped in the usual sterile fashion.       Clinical Data: No additional findings.  Objective: Vital Signs: LMP 05/22/2011   Physical Exam:  Constitutional: Patient appears well-developed HEENT:  Head:  Normocephalic Eyes:EOM are normal Neck: Normal range of motion Cardiovascular: Normal rate Pulmonary/chest: Effort normal Neurologic: Patient is alert Skin: Skin is warm Psychiatric: Patient has normal mood and affect  Ortho Exam: Ortho exam demonstrates left shoulder with 40 degrees X rotation, 85 degrees abduction, 145 degrees forward elevation passively and actively.  Excellent subscapularis strength rated 5/5.  Infraspinatus strength rated 5 -/5 with reproduction of pain.  Supraspinatus strength rated 5/5.  Axillary nerve intact with deltoid firing.  Moderate tenderness over the bicipital groove.  No tenderness over the [facility] joint.  Positive O'Brien sign.  Positive Neer and impingement sign.  Specialty Comments:  No specialty comments available.  Imaging: No results found.   PMFS History: Patient Active Problem List   Diagnosis Date Noted   Pain in left shoulder 08/18/2022   Acute cystitis with hematuria 10/06/2021   Prediabetes 10/06/2021   Obesity (BMI 30.0-34.9) 10/06/2021   Impingement syndrome of right shoulder 07/02/2021   Bilateral foot pain 04/02/2014   Bilateral swelling of feet 06/06/2011   Seasonal allergies 06/06/2011   Bilateral knee pain 06/06/2011   Past Medical History:  Diagnosis Date   Allergy    Environmental allergies     Family History  Problem Relation Age of Onset   Colon cancer Mother        unsure age   Hypertension Father    Early death Sister    High blood pressure Sister    Colon polyps Neg Hx    Esophageal cancer Neg Hx    Rectal cancer Neg Hx    Stomach cancer Neg Hx     Past Surgical History:  Procedure Laterality Date   COLONOSCOPY  10/12/2011   Pyrtle   OVARIAN CYST REMOVAL     POLYPECTOMY     TUBAL LIGATION  1985   Social History   Occupational History   Not on file  Tobacco Use   Smoking status: Never   Smokeless tobacco: Never  Vaping Use   Vaping status: Never Used  Substance and Sexual Activity   Alcohol use: No    Drug use: No   Sexual activity: Yes    Birth control/protection: None

## 2022-10-28 ENCOUNTER — Encounter (HOSPITAL_COMMUNITY): Payer: Self-pay | Admitting: Emergency Medicine

## 2022-10-28 ENCOUNTER — Ambulatory Visit (HOSPITAL_COMMUNITY)
Admission: EM | Admit: 2022-10-28 | Discharge: 2022-10-28 | Disposition: A | Discharge status: Home / Self Care | Payer: Commercial Managed Care - PPO | Attending: Family Medicine | Admitting: Family Medicine

## 2022-10-28 ENCOUNTER — Telehealth (HOSPITAL_COMMUNITY): Payer: Self-pay

## 2022-10-28 DIAGNOSIS — M25511 Pain in right shoulder: Secondary | ICD-10-CM | POA: Diagnosis not present

## 2022-10-28 DIAGNOSIS — G8929 Other chronic pain: Secondary | ICD-10-CM | POA: Diagnosis not present

## 2022-10-28 MED ORDER — KETOROLAC TROMETHAMINE 30 MG/ML IJ SOLN
30.0000 mg | Freq: Once | INTRAMUSCULAR | Status: AC
  Administered 2022-10-28: 30 mg via INTRAMUSCULAR

## 2022-10-28 MED ORDER — KETOROLAC TROMETHAMINE 10 MG PO TABS
10.0000 mg | ORAL_TABLET | Freq: Four times a day (QID) | ORAL | 0 refills | Status: DC | PRN
  Filled 2022-10-28: qty 20, 5d supply, fill #0

## 2022-10-28 MED ORDER — KETOROLAC TROMETHAMINE 30 MG/ML IJ SOLN
INTRAMUSCULAR | Status: AC
  Filled 2022-10-28: qty 1

## 2022-10-28 MED ORDER — KETOROLAC TROMETHAMINE 10 MG PO TABS: 10.0000 mg | ORAL_TABLET | Freq: Four times a day (QID) | ORAL | 0 refills | Status: DC | PRN

## 2022-10-28 NOTE — ED Triage Notes (Signed)
Pt reports gets steroid shots every 3 months in her shoulders. Pt c/o pain in right shoulder pain and unable to get into their office.

## 2022-10-28 NOTE — ED Provider Notes (Signed)
[facility]    CSN: [id number] Arrival date & time: 10/28/22  1705      History   Chief Complaint Chief Complaint  Patient presents with   Shoulder Pain    HPI Mary BODE is a 62 y.o. female.    Shoulder Pain Here for right shoulder pain. It has been hurting a long time, she has been seeing orthopedics.  It will get better when she has subacromial injections usually.  She saw them recently on September 11 and it was too soon for a subacromial injection; she was given a glenohumeral injection however.  Apparently it did not help.  They will be seeing her on September 24.  She comes in requesting [place] to do an injection of her shoulder.  No recent trauma or fall or fever.  Past Medical History:  Diagnosis Date   Allergy    Environmental allergies     Patient Active Problem List   Diagnosis Date Noted   Pain in left shoulder 08/18/2022   Acute cystitis with hematuria 10/06/2021   Prediabetes 10/06/2021   Obesity (BMI 30.0-34.9) 10/06/2021   Impingement syndrome of right shoulder 07/02/2021   Bilateral foot pain 04/02/2014   Bilateral swelling of feet 06/06/2011   Seasonal allergies 06/06/2011   Bilateral knee pain 06/06/2011    Past Surgical History:  Procedure Laterality Date   COLONOSCOPY  10/12/2011   Pyrtle   OVARIAN CYST REMOVAL     POLYPECTOMY     TUBAL LIGATION  1985    OB History   No obstetric history on file.      Home Medications    Prior to Admission medications   Medication Sig Start Date End Date Taking? Authorizing Provider  ketorolac (TORADOL) 10 MG tablet Take 1 tablet (10 mg total) by mouth every 6 (six) hours as needed (pain). 10/28/22  Yes Zenia Resides, MD  acetaminophen-codeine (TYLENOL #3) 300-30 MG tablet Take 1 tablet by mouth every 6 (six) hours as needed for moderate pain. 07/28/22   Persons, [place], PA  diclofenac Sodium (VOLTAREN) 1 % GEL Apply 2 g topically 4 (four) times daily. 01/14/22   Henson,  Vickie L, NP-C  metFORMIN (GLUCOPHAGE) 500 MG tablet Take 1 tablet (500 mg total) by mouth daily with breakfast. 04/12/22   Henson, Vickie L, NP-C  methocarbamol (ROBAXIN) 500 MG tablet Take 1 tablet (500 mg total) by mouth every 8 (eight) hours as needed for muscle spasms. 01/14/22   Henson, Zorita Pang, NP-C  Semaglutide-Weight Management (WEGOVY) 0.25 MG/0.5ML SOAJ Inject 0.25 mg into the skin once a week. 05/20/22   Avanell Shackleton, NP-C    Family History Family History  Problem Relation Age of Onset   Colon cancer Mother        unsure age   Hypertension Father    Early death Sister    High blood pressure Sister    Colon polyps Neg Hx    Esophageal cancer Neg Hx    Rectal cancer Neg Hx    Stomach cancer Neg Hx     Social History Social History   Tobacco Use   Smoking status: Never   Smokeless tobacco: Never  Vaping Use   Vaping status: Never Used  Substance Use Topics   Alcohol use: No   Drug use: No     Allergies   Patient has no known allergies.   Review of Systems Review of Systems   Physical Exam Triage Vital Signs ED Triage Vitals  Encounter Vitals Group     BP 10/28/22 1722 136/82     Systolic BP Percentile --      Diastolic BP Percentile --      Pulse Rate 10/28/22 1722 92     Resp 10/28/22 1722 17     Temp 10/28/22 1722 98 F (36.7 C)     Temp Source 10/28/22 1722 Oral     SpO2 10/28/22 1722 98 %     Weight --      Height --      Head Circumference --      Peak Flow --      Pain Score 10/28/22 1721 9     Pain Loc --      Pain Education --      Exclude from Growth Chart --    No data found.  Updated Vital Signs BP 136/82 (BP Location: Left Arm)   Pulse 92   Temp 98 F (36.7 C) (Oral)   Resp 17   LMP 05/22/2011   SpO2 98%   Visual Acuity Right Eye Distance:   Left Eye Distance:   Bilateral Distance:    Right Eye Near:   Left Eye Near:    Bilateral Near:     Physical Exam Vitals reviewed.  Constitutional:      General: She is  not in acute distress.    Appearance: She is not ill-appearing, toxic-appearing or diaphoretic.  Eyes:     Extraocular Movements: Extraocular movements intact.     Conjunctiva/sclera: Conjunctivae normal.     Pupils: Pupils are equal, round, and reactive to light.  Musculoskeletal:        General: No swelling, tenderness or deformity.  Neurological:     Mental Status: She is alert and oriented to person, place, and time.  Psychiatric:        Behavior: Behavior normal.      UC Treatments / Results  Labs (all labs ordered are listed, but only abnormal results are displayed) Labs Reviewed - No data to display  EKG   Radiology No results found.  Procedures Procedures (including critical care time)  Medications Ordered in UC Medications  ketorolac (TORADOL) 30 MG/ML injection 30 mg (has no administration in time range)    Initial Impression / Assessment and Plan / UC Course  I have reviewed the triage vital signs and the nursing notes.  Pertinent labs & imaging results that were available during my care of the patient were reviewed by me and considered in my medical decision making (see chart for details).        With this patient in orthopedics care, I am not going to do another steroid injection so near to where she just had a steroid injection 9 days ago.  This she is going to follow-up with them on the 24th.  She states "well I have just wasted my time".  I offered her a Medrol Dosepak, and she declines stating that it does not work.   she is already taking ibuprofen and states it does not work.    She relented and accepted my offer of a Toradol injection and Toradol tablets after that. She will keep her follow-up with orthopedics Final Clinical Impressions(s) / UC Diagnoses   Final diagnoses:  Chronic right shoulder pain     Discharge Instructions      You have been given a shot of Toradol 30 mg today.  Ketorolac 10 mg tablets--take 1 tablet every 6  hours as needed  for pain.  This is the same medicine that is in the shot we just gave you  Please keep your follow-up appointment with orthopedics for September 24.     ED Prescriptions     Medication Sig Dispense Auth. Provider   ketorolac (TORADOL) 10 MG tablet Take 1 tablet (10 mg total) by mouth every 6 (six) hours as needed (pain). 20 tablet Nykolas Bacallao, Janace Aris, MD      PDMP not reviewed this encounter.   Zenia Resides, MD 10/28/22 [phone]

## 2022-10-28 NOTE — Discharge Instructions (Addendum)
You have been given a shot of Toradol 30 mg today.  Ketorolac 10 mg tablets--take 1 tablet every 6 hours as needed for pain.  This is the same medicine that is in the shot we just gave you  Please keep your follow-up appointment with orthopedics for September 24.

## 2022-10-29 ENCOUNTER — Other Ambulatory Visit (HOSPITAL_COMMUNITY): Payer: Self-pay

## 2022-10-31 ENCOUNTER — Encounter: Payer: Self-pay | Admitting: Family Medicine

## 2022-10-31 ENCOUNTER — Other Ambulatory Visit (HOSPITAL_COMMUNITY): Payer: Self-pay

## 2022-10-31 ENCOUNTER — Ambulatory Visit: Payer: Commercial Managed Care - PPO | Admitting: Family Medicine

## 2022-10-31 VITALS — BP 124/80 | HR 82 | Temp 98.2°F | Resp 20 | Ht 72.0 in | Wt 224.0 lb

## 2022-10-31 DIAGNOSIS — N3001 Acute cystitis with hematuria: Secondary | ICD-10-CM

## 2022-10-31 LAB — POCT URINALYSIS DIPSTICK
Bilirubin, UA: NEGATIVE
Glucose, UA: NEGATIVE
Ketones, UA: 5
Nitrite, UA: NEGATIVE
Protein, UA: POSITIVE — AB
Spec Grav, UA: 1.025 (ref 1.010–1.025)
Urobilinogen, UA: NEGATIVE E.U./dL — AB
pH, UA: 5 (ref 5.0–8.0)

## 2022-10-31 MED ORDER — CEFDINIR 300 MG PO CAPS
300.0000 mg | ORAL_CAPSULE | Freq: Two times a day (BID) | ORAL | 0 refills | Status: AC
Start: 2022-10-31 — End: 2022-11-06
  Filled 2022-10-31: qty 10, 5d supply, fill #0

## 2022-10-31 NOTE — Progress Notes (Signed)
Assessment & Plan:  1. Acute cystitis with hematuria Education provided on urinary tract infections.  Encouraged adequate hydration with water. - POCT urinalysis dipstick - Urine Culture - cefdinir (OMNICEF) 300 MG capsule; Take 1 capsule (300 mg total) by mouth 2 (two) times daily for 5 days.  Dispense: 10 capsule; Refill: 0  Results for orders placed or performed in visit on 10/31/22  POCT urinalysis dipstick  Result Value Ref Range   Color, UA dark yellow    Clarity, UA cloudy    Glucose, UA Negative Negative   Bilirubin, UA neg    Ketones, UA 5    Spec Grav, UA 1.025 1.010 - 1.025   Blood, UA +++    pH, UA 5.0 5.0 - 8.0   Protein, UA Positive (A) Negative   Urobilinogen, UA negative (A) 0.2 or 1.0 E.U./dL   Nitrite, UA neg    Leukocytes, UA Large (3+) (A) Negative   Appearance     Odor      Follow up plan: Return if symptoms worsen or fail to improve.  Deliah Boston, MSN, APRN, FNP-C  Subjective:  HPI: Mary Burton is a 62 y.o. female presenting on 10/31/2022 for Urinary Tract Infection (Burning, blood noticed and dark color )  Patient complains of dysuria and hematuria.  She has had symptoms for 1 week. Patient denies fever. Patient does have a history of recurrent UTI.  Patient does not have a history of pyelonephritis.     ROS: Negative unless specifically indicated above in HPI.   Relevant past medical history reviewed and updated as indicated.   Allergies and medications reviewed and updated.   Current Outpatient Medications:    ketorolac (TORADOL) 10 MG tablet, Take 1 tablet (10 mg total) by mouth every 6 (six) hours as needed (pain)., Disp: 20 tablet, Rfl: 0  No Known Allergies  Objective:   BP 124/80   Pulse 82   Temp 98.2 F (36.8 C)   Resp 20   Ht 6' (1.829 m)   Wt 224 lb (101.6 kg)   LMP 05/22/2011   BMI 30.38 kg/m    Physical Exam Vitals reviewed.  Constitutional:      General: She is not in acute distress.    Appearance:  Normal appearance. She is not ill-appearing, toxic-appearing or diaphoretic.  HENT:     Head: Normocephalic and atraumatic.  Eyes:     General: No scleral icterus.       Right eye: No discharge.        Left eye: No discharge.     Conjunctiva/sclera: Conjunctivae normal.  Cardiovascular:     Rate and Rhythm: Normal rate.  Pulmonary:     Effort: Pulmonary effort is normal. No respiratory distress.  Abdominal:     Tenderness: There is no right CVA tenderness or left CVA tenderness.  Musculoskeletal:        General: Normal range of motion.     Cervical back: Normal range of motion.  Skin:    General: Skin is warm and dry.     Capillary Refill: Capillary refill takes less than 2 seconds.  Neurological:     General: No focal deficit present.     Mental Status: She is alert and oriented to person, place, and time. Mental status is at baseline.  Psychiatric:        Mood and Affect: Mood normal.        Behavior: Behavior normal.        Thought  Content: Thought content normal.        Judgment: Judgment normal.

## 2022-10-31 NOTE — Addendum Note (Signed)
Addended by: Magdalene River on: 10/31/2022 04:17 PM   Modules accepted: Orders

## 2022-11-01 ENCOUNTER — Ambulatory Visit: Payer: Commercial Managed Care - PPO | Admitting: Physician Assistant

## 2022-11-01 ENCOUNTER — Other Ambulatory Visit (HOSPITAL_COMMUNITY): Payer: Self-pay

## 2022-11-01 ENCOUNTER — Telehealth: Payer: Self-pay | Admitting: Physician Assistant

## 2022-11-01 NOTE — Telephone Encounter (Signed)
Pt is requesting a call in regards to medical advice. Pt visited an urgent care and received a shot and pt stated medication is slowly going away and is now looking for for further medical advice due to her not wanting to mix the wrong medications together. Pt best call back number is [phone]

## 2022-11-02 NOTE — Telephone Encounter (Signed)
Appt has been made with [facility].

## 2022-11-03 LAB — URINE CULTURE

## 2022-11-04 ENCOUNTER — Encounter (HOSPITAL_BASED_OUTPATIENT_CLINIC_OR_DEPARTMENT_OTHER): Payer: Self-pay

## 2022-11-04 ENCOUNTER — Telehealth: Payer: Self-pay | Admitting: Surgical

## 2022-11-04 NOTE — Telephone Encounter (Signed)
Patient called and said that she is supposed to go back to work Friday after seeing him but she can barely move her arm. She also need a note for work. CB#[phone]

## 2022-11-04 NOTE — Telephone Encounter (Signed)
Called and discussed. Made appointment for Wednesday AM at 8 AM for injection per patient request

## 2022-11-04 NOTE — Telephone Encounter (Signed)
Is she referring to her left or right shoulder? If left, then next step would be MRI arthrogram as that shoulder has not been imaged.  If she is referring to the right shoulder, okay for work note and plan for likely glenohumeral injection on 10/4 at her appointment

## 2022-11-04 NOTE — Telephone Encounter (Signed)
I called and talked to the pt. She stated its is her right shoulder. Stated she is off next week so doesn't need a note but she is just sitting in pain. Said that she was given toradol 10mg  and said this isnt helping at all. She is frustrated by the pain and her care. Can you please call her back?

## 2022-11-08 ENCOUNTER — Ambulatory Visit: Payer: Commercial Managed Care - PPO | Admitting: Physician Assistant

## 2022-11-09 ENCOUNTER — Other Ambulatory Visit: Payer: Self-pay

## 2022-11-09 ENCOUNTER — Encounter: Payer: Self-pay | Admitting: Surgical

## 2022-11-09 ENCOUNTER — Ambulatory Visit (INDEPENDENT_AMBULATORY_CARE_PROVIDER_SITE_OTHER): Payer: Commercial Managed Care - PPO | Admitting: Surgical

## 2022-11-09 DIAGNOSIS — G8929 Other chronic pain: Secondary | ICD-10-CM

## 2022-11-09 DIAGNOSIS — M7521 Bicipital tendinitis, right shoulder: Secondary | ICD-10-CM

## 2022-11-09 DIAGNOSIS — M25511 Pain in right shoulder: Secondary | ICD-10-CM

## 2022-11-09 MED ORDER — BUPIVACAINE HCL 0.25 % IJ SOLN
9.0000 mL | INTRAMUSCULAR | Status: AC | PRN
Start: 2022-11-09 — End: 2022-11-09
  Administered 2022-11-09: 9 mL via INTRA_ARTICULAR

## 2022-11-09 MED ORDER — METHYLPREDNISOLONE ACETATE 40 MG/ML IJ SUSP
40.0000 mg | INTRAMUSCULAR | Status: AC | PRN
Start: 2022-11-09 — End: 2022-11-09
  Administered 2022-11-09: 40 mg via INTRA_ARTICULAR

## 2022-11-09 MED ORDER — LIDOCAINE HCL 1 % IJ SOLN
5.0000 mL | INTRAMUSCULAR | Status: AC | PRN
Start: 2022-11-09 — End: 2022-11-09
  Administered 2022-11-09: 5 mL

## 2022-11-09 NOTE — Progress Notes (Signed)
Procedure Note  Patient: Mary Burton             Date of Birth: [birth year]           MRN: [id number]             Visit Date: 11/09/2022  Procedures: Visit Diagnoses:  1. Biceps tendinitis, right   2. Chronic right shoulder pain     Large Joint Inj: R glenohumeral on 11/09/2022 9:57 AM Details: 22 G 3.5 in needle, ultrasound-guided posterior approach Medications: 5 mL lidocaine 1 %; 9 mL bupivacaine 0.25 %; 40 mg methylPREDNISolone acetate 40 MG/ML Outcome: tolerated well, no immediate complications  Patient returns for planned right glenohumeral injection.  Had recent left glenohumeral injection that has given her great relief.  If this left glenohumeral injection is short-lived, next step would be MRI arthrogram of the left shoulder Procedure, treatment alternatives, risks and benefits explained, specific risks discussed. Consent was given by the patient. Patient was prepped and draped in the usual sterile fashion.

## 2022-11-11 ENCOUNTER — Ambulatory Visit: Payer: Commercial Managed Care - PPO | Admitting: Surgical

## 2022-12-11 ENCOUNTER — Ambulatory Visit (HOSPITAL_COMMUNITY)
Admission: EM | Admit: 2022-12-11 | Discharge: 2022-12-11 | Disposition: A | Discharge status: Home / Self Care | Payer: Commercial Managed Care - PPO | Attending: Emergency Medicine | Admitting: Emergency Medicine

## 2022-12-11 ENCOUNTER — Encounter (HOSPITAL_COMMUNITY): Payer: Self-pay | Admitting: Emergency Medicine

## 2022-12-11 ENCOUNTER — Ambulatory Visit (HOSPITAL_COMMUNITY): Payer: Commercial Managed Care - PPO

## 2022-12-11 DIAGNOSIS — M25531 Pain in right wrist: Secondary | ICD-10-CM | POA: Diagnosis not present

## 2022-12-11 MED ORDER — PREDNISONE 20 MG PO TABS: 40.0000 mg | ORAL_TABLET | Freq: Every day | ORAL | 0 refills | Status: AC

## 2022-12-11 NOTE — ED Triage Notes (Signed)
Pt c/o right wrist pain that radiates up arm. States it has been bothering her for 1 week and she has tried a brace but is not getting better. Finger were swollen this morning

## 2022-12-11 NOTE — ED Provider Notes (Signed)
[facility]    CSN: [id number] Arrival date & time: 12/11/22  1006      History   Chief Complaint Chief Complaint  Patient presents with   Wrist Pain    HPI Mary Burton is a 62 y.o. female.  1 week history of right wrist pain Radiates up her arm. 9/10 pain Not having numbness or tingling Denies injury or trauma She has tried wearing a brace. Also used ibuprofen and aleve   History of chronic right shoulder pain. Sees orthopedics. Also bicep tendonitis   Past Medical History:  Diagnosis Date   Allergy    Environmental allergies     Patient Active Problem List   Diagnosis Date Noted   Pain in left shoulder 08/18/2022   Acute cystitis with hematuria 10/06/2021   Prediabetes 10/06/2021   Obesity (BMI 30.0-34.9) 10/06/2021   Impingement syndrome of right shoulder 07/02/2021   Bilateral foot pain 04/02/2014   Bilateral swelling of feet 06/06/2011   Seasonal allergies 06/06/2011   Bilateral knee pain 06/06/2011    Past Surgical History:  Procedure Laterality Date   COLONOSCOPY  10/12/2011   Pyrtle   OVARIAN CYST REMOVAL     POLYPECTOMY     TUBAL LIGATION  1985    OB History   No obstetric history on file.      Home Medications    Prior to Admission medications   Medication Sig Start Date End Date Taking? Authorizing Provider  predniSONE (DELTASONE) 20 MG tablet Take 2 tablets (40 mg total) by mouth daily with breakfast for 5 days. 12/11/22 12/16/22 Yes Anieya Helman, Lurena Joiner PA-C    Family History Family History  Problem Relation Age of Onset   Colon cancer Mother        unsure age   Hypertension Father    Early death Sister    High blood pressure Sister    Colon polyps Neg Hx    Esophageal cancer Neg Hx    Rectal cancer Neg Hx    Stomach cancer Neg Hx     Social History Social History   Tobacco Use   Smoking status: Never   Smokeless tobacco: Never  Vaping Use   Vaping status: Never Used  Substance Use Topics   Alcohol use:  No   Drug use: No     Allergies   Patient has no known allergies.   Review of Systems Review of Systems As per HPI  Physical Exam Triage Vital Signs ED Triage Vitals [12/11/22 1021]  Encounter Vitals Group     BP      Systolic BP Percentile      Diastolic BP Percentile      Pulse      Resp      Temp      Temp src      SpO2      Weight      Height      Head Circumference      Peak Flow      Pain Score 9     Pain Loc      Pain Education      Exclude from Growth Chart    No data found.  Updated Vital Signs BP (!) 145/89 (BP Location: Right Arm)   Pulse 86   Temp 97.7 F (36.5 C) (Oral)   Resp 16   LMP 05/22/2011   SpO2 99%   Physical Exam Vitals and nursing note reviewed.  Constitutional:      General:  She is not in acute distress. HENT:     Mouth/Throat:     Pharynx: Oropharynx is clear.  Cardiovascular:     Rate and Rhythm: Normal rate and regular rhythm.     Pulses: Normal pulses.  Pulmonary:     Effort: Pulmonary effort is normal.  Musculoskeletal:        General: Tenderness present. No swelling, deformity or signs of injury.     Cervical back: Normal range of motion.     Comments: Decreased ROM right wrist due to pain. Full ROM of all finger joints. There is no swelling or deformity. Grip strength is decreased d/t pain. Cap refill < 2 seconds, distal sensation intact. Strong radial pulse  Skin:    General: Skin is warm and dry.     Capillary Refill: Capillary refill takes less than 2 seconds.  Neurological:     Mental Status: She is alert and oriented to person, place, and time.      UC Treatments / Results  Labs (all labs ordered are listed, but only abnormal results are displayed) Labs Reviewed - No data to display  EKG  Radiology No results found.  Procedures Procedures   Medications Ordered in UC Medications - No data to display  Initial Impression / Assessment and Plan / UC Course  I have reviewed the triage vital signs and  the nursing notes.  Pertinent labs & imaging results that were available during my care of the patient were reviewed by me and considered in my medical decision making (see chart for details).  Right wrist pain No indication for xray imaging at this time, discussed with patient who agrees, believes it's a nerve issue Trial of prednisone burst. Advised not using NSAIDs at the same time. Try ice, elevate, wrist brace. Contact orthopedics for follow up. Patient agreeable to plan  Final Clinical Impressions(s) / UC Diagnoses   Final diagnoses:  Right wrist pain     Discharge Instructions      Please take the prednisone (anti-inflammatory) as prescribed Once daily with breakfast for 5 days Apply ice (not directly on the skin) for 20 minutes several times daily Call your orthopedic specialist first thing in the morning     ED Prescriptions     Medication Sig Dispense Auth. Provider   predniSONE (DELTASONE) 20 MG tablet Take 2 tablets (40 mg total) by mouth daily with breakfast for 5 days. 10 tablet Tamaiya Bump, Lurena Joiner, PA-C      PDMP not reviewed this encounter.   Marlow Baars, [place] 12/11/22 1047

## 2022-12-11 NOTE — Discharge Instructions (Addendum)
Please take the prednisone (anti-inflammatory) as prescribed Once daily with breakfast for 5 days Apply ice (not directly on the skin) for 20 minutes several times daily Call your orthopedic specialist first thing in the morning

## 2022-12-12 ENCOUNTER — Ambulatory Visit: Payer: Commercial Managed Care - PPO | Admitting: Physician Assistant

## 2022-12-12 ENCOUNTER — Encounter: Payer: Self-pay | Admitting: Physician Assistant

## 2022-12-12 ENCOUNTER — Other Ambulatory Visit (INDEPENDENT_AMBULATORY_CARE_PROVIDER_SITE_OTHER): Payer: Self-pay

## 2022-12-12 DIAGNOSIS — M25531 Pain in right wrist: Secondary | ICD-10-CM

## 2022-12-12 NOTE — Progress Notes (Signed)
Office Visit Note   Patient: Mary Burton           Date of Birth: 18-Jun-1960           MRN: [id number] Visit Date: 12/12/2022              Requested by: Avanell Shackleton, NP-C [address] [place],  [place] [address] PCP: Avanell Shackleton, NP-C  No chief complaint on file.     HPI: Mary Burton is a 62 year old woman with a chief complaint of right wrist pain.  She denies any fever chills or injury.  It was significant enough that yesterday she did go to an urgent care x-rays were not done but they thought she had "a pinched nerve ".  Describes her pain is moderate just started taking prednisone  Assessment & Plan: Visit Diagnoses:  1. Pain in right wrist     Plan: Mary Burton is a pleasant 62 year old woman with a 2-day history of acute onset of right wrist pain and swelling.  She is right-hand dominant she denies any fever chills or any injuries.  This was significant enough that she did go to an urgent care yesterday and was placed on prednisone and placed in a wrist splint.  She does have a previous history of gout and without any predisposing injuries and the nature of how this began although rare I would get a uric acid today.  In the meantime I would like for her to complete her prednisone Dosepak and keep her wrist immobilized though she should move her finger so as to not to get too stiff.  Do not see any sign of an infective process  Follow-Up Instructions: 1 week  Ortho Exam  Patient is alert, oriented, no adenopathy, well-dressed, normal affect, normal respiratory effort. Right wrist she is neurovascular intact strong pulse no paresthesias she has good opposition of her fingers but does have decreased wrist grip strength secondary to pain mostly tender over the wrist itself  Imaging: No results found. No images are attached to the encounter.  Labs: Lab Results  Component Value Date   HGBA1C 6.0 10/06/2021   HGBA1C 5.8 02/28/2013   LABURIC 4.3 04/02/2014     Lab  Results  Component Value Date   ALBUMIN 4.0 10/06/2021   ALBUMIN 3.9 04/02/2014   ALBUMIN 4.0 12/11/2012    No results found for: "MG" No results found for: "VD25OH"  No results found for: "PREALBUMIN"    Latest Ref Rng & Units 10/06/2021    3:23 PM 04/02/2014    4:10 PM  CBC EXTENDED  WBC 4.0 - 10.5 K/uL 5.1  4.7   RBC 3.87 - 5.11 Mil/uL 4.22  4.21   Hemoglobin 12.0 - 15.0 g/dL 62.1  30.8   HCT 65.7 - 46.0 % 36.9  37.4   Platelets 150.0 - 400.0 K/uL 201.0  237   NEUT# 1.4 - 7.7 K/uL 2.9  2.7   Lymph# 0.7 - 4.0 K/uL 1.6  1.5      There is no height or weight on file to calculate BMI.  Orders:  Orders Placed This Encounter  Procedures   XR Wrist Complete Right   No orders of the defined types were placed in this encounter.    Procedures: No procedures performed  Clinical Data: No additional findings.  ROS:  All other systems negative, except as noted in the HPI. Review of Systems  Objective: Vital Signs: LMP 05/22/2011   Specialty Comments:  No specialty comments  available.  PMFS History: Patient Active Problem List   Diagnosis Date Noted   Pain in left shoulder 08/18/2022   Acute cystitis with hematuria 10/06/2021   Prediabetes 10/06/2021   Obesity (BMI 30.0-34.9) 10/06/2021   Impingement syndrome of right shoulder 07/02/2021   Bilateral foot pain 04/02/2014   Bilateral swelling of feet 06/06/2011   Seasonal allergies 06/06/2011   Bilateral knee pain 06/06/2011   Past Medical History:  Diagnosis Date   Allergy    Environmental allergies     Family History  Problem Relation Age of Onset   Colon cancer Mother        unsure age   Hypertension Father    Early death Sister    High blood pressure Sister    Colon polyps Neg Hx    Esophageal cancer Neg Hx    Rectal cancer Neg Hx    Stomach cancer Neg Hx     Past Surgical History:  Procedure Laterality Date   COLONOSCOPY  10/12/2011   Pyrtle   OVARIAN CYST REMOVAL     POLYPECTOMY     TUBAL  LIGATION  1985   Social History   Occupational History   Not on file  Tobacco Use   Smoking status: Never   Smokeless tobacco: Never  Vaping Use   Vaping status: Never Used  Substance and Sexual Activity   Alcohol use: No   Drug use: No   Sexual activity: Yes    Birth control/protection: None

## 2022-12-12 NOTE — Addendum Note (Signed)
Addended by: Michaele Offer on: 12/12/2022 04:44 PM   Modules accepted: Orders

## 2022-12-13 LAB — URIC ACID: Uric Acid, Serum: 4 mg/dL (ref 2.5–7.0)

## 2022-12-19 ENCOUNTER — Telehealth: Payer: Self-pay | Admitting: Physician Assistant

## 2022-12-19 NOTE — Telephone Encounter (Signed)
Pt called stating her hand is still in pain. Pt asking for a call back for medical advice. or pain medication. Pt phone number is [phone].

## 2022-12-21 NOTE — Telephone Encounter (Signed)
I called and advised Mary Burton of Dr. Buel Ream message, she states that she understands

## 2023-01-16 DIAGNOSIS — Z6832 Body mass index (BMI) 32.0-32.9, adult: Secondary | ICD-10-CM | POA: Diagnosis not present

## 2023-01-16 DIAGNOSIS — R319 Hematuria, unspecified: Secondary | ICD-10-CM | POA: Diagnosis not present

## 2023-01-16 DIAGNOSIS — Z1231 Encounter for screening mammogram for malignant neoplasm of breast: Secondary | ICD-10-CM | POA: Diagnosis not present

## 2023-01-16 DIAGNOSIS — Z01419 Encounter for gynecological examination (general) (routine) without abnormal findings: Secondary | ICD-10-CM | POA: Diagnosis not present

## 2023-01-16 LAB — HM MAMMOGRAPHY

## 2023-01-20 ENCOUNTER — Other Ambulatory Visit (HOSPITAL_COMMUNITY): Payer: Self-pay

## 2023-01-20 MED ORDER — CIPROFLOXACIN HCL 500 MG PO TABS
500.0000 mg | ORAL_TABLET | Freq: Two times a day (BID) | ORAL | 0 refills | Status: DC
  Filled 2023-01-20: qty 10, 5d supply, fill #0

## 2023-01-23 ENCOUNTER — Telehealth: Payer: Self-pay | Admitting: Physician Assistant

## 2023-01-23 NOTE — Telephone Encounter (Signed)
Pa persons will be out of office rest of pt anf left vm on pt for to call and reschedule 1st available appt

## 2023-01-24 ENCOUNTER — Other Ambulatory Visit: Payer: Self-pay

## 2023-01-24 ENCOUNTER — Ambulatory Visit (INDEPENDENT_AMBULATORY_CARE_PROVIDER_SITE_OTHER): Payer: Commercial Managed Care - PPO | Admitting: Physician Assistant

## 2023-01-24 ENCOUNTER — Other Ambulatory Visit (HOSPITAL_COMMUNITY): Payer: Self-pay

## 2023-01-24 ENCOUNTER — Other Ambulatory Visit: Payer: Self-pay | Admitting: Physician Assistant

## 2023-01-24 ENCOUNTER — Telehealth: Payer: Self-pay | Admitting: Sports Medicine

## 2023-01-24 ENCOUNTER — Encounter: Payer: Self-pay | Admitting: Physician Assistant

## 2023-01-24 ENCOUNTER — Ambulatory Visit: Payer: Commercial Managed Care - PPO | Admitting: Physician Assistant

## 2023-01-24 DIAGNOSIS — G8929 Other chronic pain: Secondary | ICD-10-CM

## 2023-01-24 DIAGNOSIS — M25512 Pain in left shoulder: Secondary | ICD-10-CM

## 2023-01-24 MED ORDER — TRAMADOL HCL 50 MG PO TABS
50.0000 mg | ORAL_TABLET | Freq: Two times a day (BID) | ORAL | 0 refills | Status: DC | PRN
Start: 2023-01-24 — End: 2023-08-25
  Filled 2023-01-24: qty 30, 15d supply, fill #0

## 2023-01-24 NOTE — Progress Notes (Signed)
Office Visit Note   Patient: ILIAN RILING           Date of Birth: 1960/10/23           MRN: [id number] Visit Date: 01/24/2023              Requested by: Avanell Shackleton, NP-C [address] [place],  [place] [address] PCP: Avanell Shackleton, NP-C   Assessment & Plan: Visit Diagnoses:  1. Chronic left shoulder pain     Plan: Impression is chronic left shoulder pain.  Although the patient's pain is all lateral aspect and she has no pain with speeds and O'Brien's testing, she tells me she had similar symptoms back in September when glenohumeral cortisone injection was performed which helped quite a bit.  We have discussed repeating this injection versus obtaining an MRI of the left shoulder.  She would like to try an injection today.  Follow-up with Shirlee Limerick or Franky Macho if her symptoms persist or return.  Follow-Up Instructions: Return if symptoms worsen or fail to improve.   Orders:  No orders of the defined types were placed in this encounter.  No orders of the defined types were placed in this encounter.     Procedures: No procedures performed   Clinical Data: No additional findings.   Subjective: Chief Complaint  Patient presents with   Left Shoulder - Pain    HPI the patient is a 62 year old female patient of Maryanne and Leane Call who comes in today with recurrent left shoulder pain.  The pain she has is primarily to the lateral shoulder.  Symptoms are aggravated with activity such as when she is working as well as when she internally rotates her shoulder.  She has been taking Tylenol without relief.  She was seen by Clerance Lav in July where subacromial cortisone injection was performed.  She had minimal relief from this injection.  She was seen by Franky Macho in September where glenohumeral cortisone injection was performed.  this helped quite a bit until a few days ago.  No new injury or change in activity.  She tells me at the time of the glenohumeral cortisone injection she was  having pain to the lateral shoulder.  Review of Systems as detailed in HPI.  All others reviewed and are negative.   Objective: Vital Signs: LMP 05/22/2011   Physical Exam well-developed well-nourished female no acute distress.  Alert and oriented x 3.  Ortho Exam left shoulder exam reveals forward flexion to about 120 degrees.  External rotation to 60 degrees.  Internal rotation to her her back pocket.  She has pain with empty can test.  No pain to the anterior shoulder with speeds and O'Brien's testing.  She is neurovascularly intact distally.  Specialty Comments:  No specialty comments available.  Imaging: No new imaging   PMFS History: Patient Active Problem List   Diagnosis Date Noted   Pain in left shoulder 08/18/2022   Acute cystitis with hematuria 10/06/2021   Prediabetes 10/06/2021   Obesity (BMI 30.0-34.9) 10/06/2021   Impingement syndrome of right shoulder 07/02/2021   Bilateral foot pain 04/02/2014   Bilateral swelling of feet 06/06/2011   Seasonal allergies 06/06/2011   Bilateral knee pain 06/06/2011   Past Medical History:  Diagnosis Date   Allergy    Environmental allergies     Family History  Problem Relation Age of Onset   Colon cancer Mother        unsure age   Hypertension Father  Early death Sister    High blood pressure Sister    Colon polyps Neg Hx    Esophageal cancer Neg Hx    Rectal cancer Neg Hx    Stomach cancer Neg Hx     Past Surgical History:  Procedure Laterality Date   COLONOSCOPY  10/12/2011   Pyrtle   OVARIAN CYST REMOVAL     POLYPECTOMY     TUBAL LIGATION  1985   Social History   Occupational History   Not on file  Tobacco Use   Smoking status: Never   Smokeless tobacco: Never  Vaping Use   Vaping status: Never Used  Substance and Sexual Activity   Alcohol use: No   Drug use: No   Sexual activity: Yes    Birth control/protection: None

## 2023-01-24 NOTE — Addendum Note (Signed)
Addended by: Cristie Hem on: 01/24/2023 04:08 PM   Modules accepted: Orders

## 2023-01-24 NOTE — Telephone Encounter (Signed)
Pt came in was scheduled to see [place] for cortisone injection for her L shoulder, pt was moved to L. Stanberys schedule due to MA Persons not being in office today and was advised she would need a US guided injection not a cortisone, pt works for [employer] and has very limited availability she would like to know is there a way she can get worked in onto the schedule please advise call after 3:30, or maybe we can talk with Alvester Morin about her being worked onto his schedule possibly?

## 2023-01-25 ENCOUNTER — Telehealth: Payer: Self-pay | Admitting: Surgical

## 2023-01-25 DIAGNOSIS — G8929 Other chronic pain: Secondary | ICD-10-CM

## 2023-01-25 NOTE — Telephone Encounter (Signed)
She needs to have an MRI of this shoulder before anymore injections. We know there is something going on in the shoulder based on her response to [facility] injection initially but need to get scan to see what exactly this is

## 2023-01-25 NOTE — Telephone Encounter (Signed)
Spoke with patient regarding the previous message from [place] and that she would get a call regarding setting up an MRI. Patient request the call to be after 3:30 pm due to the fact she's at work.

## 2023-01-26 NOTE — Telephone Encounter (Signed)
Need MRI arthrogram of the left shoulder to eval RTC tear vs SLAP tear

## 2023-02-09 ENCOUNTER — Ambulatory Visit: Payer: Commercial Managed Care - PPO | Admitting: Surgical

## 2023-02-15 ENCOUNTER — Ambulatory Visit: Payer: Commercial Managed Care - PPO | Admitting: Family Medicine

## 2023-02-15 ENCOUNTER — Encounter: Payer: Self-pay | Admitting: Family Medicine

## 2023-02-15 VITALS — BP 124/76 | HR 98 | Temp 97.6°F | Ht 72.0 in | Wt 226.0 lb

## 2023-02-15 DIAGNOSIS — R202 Paresthesia of skin: Secondary | ICD-10-CM | POA: Diagnosis not present

## 2023-02-15 DIAGNOSIS — R200 Anesthesia of skin: Secondary | ICD-10-CM | POA: Diagnosis not present

## 2023-02-15 NOTE — Progress Notes (Signed)
 Subjective:     Patient ID: Mary Burton, female    DOB: 10-Oct-1960, 63 y.o.   MRN: [id number]  Knee Pain  Associated symptoms include tingling.     History of Present Illness         C/o numbness and tingling of right hand at night only. Denies neck pain. Denies arm pain, numbness or tingling. No other areas with numbness or tingling.   Sleeping with a brace at night on right wrist without any relief.   I last saw her in March and ordered labs to evaluate prediabetes, thyroid  and A1c and she did not get these done.     Health Maintenance Due  Topic Date Due   HIV Screening  Never done   Hepatitis C Screening  Never done   MAMMOGRAM  Never done   Zoster Vaccines- Shingrix (1 of 2) Never done   Cervical Cancer Screening (HPV/Pap Cotest)  02/23/2014    Past Medical History:  Diagnosis Date   Allergy    Environmental allergies     Past Surgical History:  Procedure Laterality Date   COLONOSCOPY  10/12/2011   Pyrtle   OVARIAN CYST REMOVAL     POLYPECTOMY     TUBAL LIGATION  1985    Family History  Problem Relation Age of Onset   Colon cancer Mother        unsure age   Hypertension Father    Early death Sister    High blood pressure Sister    Colon polyps Neg Hx    Esophageal cancer Neg Hx    Rectal cancer Neg Hx    Stomach cancer Neg Hx     Social History   Socioeconomic History   Marital status: Single    Spouse name: Not on file   Number of children: Not on file   Years of education: Not on file   Highest education level: Not on file  Occupational History   Not on file  Tobacco Use   Smoking status: Never   Smokeless tobacco: Never  Vaping Use   Vaping status: Never Used  Substance and Sexual Activity   Alcohol use: No   Drug use: No   Sexual activity: Yes    Birth control/protection: None  Other Topics Concern   Not on file  Social History Narrative   Not on file   Social Drivers of Health   Financial Resource Strain: Not on  file  Food Insecurity: Not on file  Transportation Needs: Not on file  Physical Activity: Not on file  Stress: Not on file  Social Connections: Not on file  Intimate Partner Violence: Not on file    Outpatient Medications Prior to Visit  Medication Sig Dispense Refill   ciprofloxacin  (CIPRO ) 500 MG tablet Take 1 tablet (500 mg total) by mouth 2 (two) times daily. 10 tablet 0   traMADol  (ULTRAM ) 50 MG tablet Take 1 tablet (50 mg total) by mouth every 12 (twelve) hours as needed. (Patient not taking: Reported on 02/15/2023) 30 tablet 0   No facility-administered medications prior to visit.    No Known Allergies  Review of Systems  Constitutional:  Negative for chills, fever, malaise/fatigue and weight loss.  Respiratory:  Negative for shortness of breath.   Cardiovascular:  Negative for chest pain and palpitations.  Gastrointestinal:  Negative for nausea and vomiting.  Musculoskeletal:  Positive for joint pain. Negative for neck pain.  Neurological:  Positive for tingling and sensory change. Negative for  dizziness and focal weakness.       Objective:    Physical Exam Constitutional:      General: She is not in acute distress.    Appearance: She is not ill-appearing.  Eyes:     Extraocular Movements: Extraocular movements intact.     Conjunctiva/sclera: Conjunctivae normal.  Cardiovascular:     Rate and Rhythm: Normal rate.  Pulmonary:     Effort: Pulmonary effort is normal.  Musculoskeletal:     Right wrist: Normal.     Right hand: Normal. No tenderness. Normal range of motion. Normal strength. Normal sensation. Normal capillary refill. Normal pulse.     Cervical back: Normal range of motion and neck supple.  Skin:    General: Skin is warm and dry.  Neurological:     General: No focal deficit present.     Mental Status: She is alert and oriented to person, place, and time.     Cranial Nerves: No cranial nerve deficit.     Sensory: No sensory deficit.     Motor: No  weakness.     Coordination: Coordination normal.     Gait: Gait normal.  Psychiatric:        Mood and Affect: Mood normal.        Behavior: Behavior normal.        Thought Content: Thought content normal.      BP 124/76 (BP Location: Left Arm, Patient Position: Sitting, Cuff Size: Large)   Pulse 98   Temp 97.6 F (36.4 C) (Temporal)   Ht 6' (1.829 m)   Wt 226 lb (102.5 kg)   LMP 05/22/2011   SpO2 100%   BMI 30.65 kg/m  Wt Readings from Last 3 Encounters:  02/15/23 226 lb (102.5 kg)  10/31/22 224 lb (101.6 kg)  04/12/22 236 lb (107 kg)       Assessment & Plan:   Problem List Items Addressed This Visit   None Visit Diagnoses       Numbness and tingling in right hand    -  Primary   Relevant Orders   Ambulatory referral to Hand Surgery      Normal hand exam today and she reports numbness and tingling is only when she goes to bed. No red flag symptoms.  Requests referral to a hand specialist. Referral made.  She has been seeing [facility] for several orthopedic issues.  Offered for her to the labs I ordered for her at her last visit in March done today and she declines.   I am having Mary Burton maintain her ciprofloxacin  and traMADol .  No orders of the defined types were placed in this encounter.

## 2023-03-14 ENCOUNTER — Telehealth: Payer: Self-pay | Admitting: Surgical

## 2023-03-14 NOTE — Telephone Encounter (Signed)
Pt called in confusion in regards to further steps in order to decrease her left shoulder pain. She stated she was told different information about 2 different times. Pt request a phone call to discuss actions that need to be taken.

## 2023-03-14 NOTE — Telephone Encounter (Signed)
 We cannot really do anything about her left shoulder tomorrow.  She needs MRI of the left shoulder like we talked about back in December.  This MRI has already been ordered and authorized still not sure where the disconnect has been.  We can do 1 more injection into her right shoulder if this is her main complaint.

## 2023-03-14 NOTE — Telephone Encounter (Signed)
 Patient called back and asked when someone would let her know something on this.  She is in so much pain, has used NSAIDS/rubs, tylenol  and ice/heat/rest.  I worked her in tmrw 330pm, she cannot leave work til 3pm.  I blocked an earlier slot to accommodate/hopefully will balance out. You do NOT need to call her back today.

## 2023-03-15 ENCOUNTER — Ambulatory Visit: Payer: Commercial Managed Care - PPO | Admitting: Surgical

## 2023-03-15 ENCOUNTER — Encounter: Payer: Self-pay | Admitting: Surgical

## 2023-03-15 ENCOUNTER — Other Ambulatory Visit: Payer: Self-pay

## 2023-03-15 DIAGNOSIS — M25511 Pain in right shoulder: Secondary | ICD-10-CM

## 2023-03-15 DIAGNOSIS — M7521 Bicipital tendinitis, right shoulder: Secondary | ICD-10-CM | POA: Diagnosis not present

## 2023-03-15 DIAGNOSIS — G8929 Other chronic pain: Secondary | ICD-10-CM

## 2023-03-15 MED ORDER — METHYLPREDNISOLONE ACETATE 40 MG/ML IJ SUSP
40.0000 mg | INTRAMUSCULAR | Status: AC | PRN
Start: 2023-03-15 — End: 2023-03-15
  Administered 2023-03-15: 40 mg via INTRA_ARTICULAR

## 2023-03-15 MED ORDER — LIDOCAINE HCL 1 % IJ SOLN
5.0000 mL | INTRAMUSCULAR | Status: AC | PRN
Start: 2023-03-15 — End: 2023-03-15
  Administered 2023-03-15: 5 mL

## 2023-03-15 MED ORDER — BUPIVACAINE HCL 0.25 % IJ SOLN
9.0000 mL | INTRAMUSCULAR | Status: AC | PRN
Start: 2023-03-15 — End: 2023-03-15
  Administered 2023-03-15: 9 mL via INTRA_ARTICULAR

## 2023-03-15 NOTE — Progress Notes (Signed)
   Procedure Note  Patient: Mary Burton             Date of Birth: [birth year]           MRN: [id number]             Visit Date: 03/15/2023  Procedures: Visit Diagnoses:  1. Biceps tendinitis, right   2. Chronic right shoulder pain     Large Joint Inj: R glenohumeral on 03/15/2023 5:19 PM Details: 22 G 3.5 in needle, ultrasound-guided posterior approach Medications: 5 mL lidocaine  1 %; 9 mL bupivacaine  0.25 %; 40 mg methylPREDNISolone  acetate 40 MG/ML Outcome: tolerated well, no immediate complications Procedure, treatment alternatives, risks and benefits explained, specific risks discussed. Consent was given by the patient. Patient was prepped and draped in the usual sterile fashion.

## 2023-05-11 ENCOUNTER — Encounter (HOSPITAL_BASED_OUTPATIENT_CLINIC_OR_DEPARTMENT_OTHER): Payer: Self-pay | Admitting: Student

## 2023-05-11 ENCOUNTER — Ambulatory Visit (HOSPITAL_BASED_OUTPATIENT_CLINIC_OR_DEPARTMENT_OTHER): Admitting: Student

## 2023-05-11 DIAGNOSIS — M25462 Effusion, left knee: Secondary | ICD-10-CM | POA: Diagnosis not present

## 2023-05-11 DIAGNOSIS — M1712 Unilateral primary osteoarthritis, left knee: Secondary | ICD-10-CM | POA: Diagnosis not present

## 2023-05-11 MED ORDER — LIDOCAINE HCL 1 % IJ SOLN
4.0000 mL | INTRAMUSCULAR | Status: AC | PRN
Start: 2023-05-11 — End: 2023-05-11
  Administered 2023-05-11: 4 mL

## 2023-05-11 MED ORDER — TRIAMCINOLONE ACETONIDE 40 MG/ML IJ SUSP
2.0000 mL | INTRAMUSCULAR | Status: AC | PRN
Start: 2023-05-11 — End: 2023-05-11
  Administered 2023-05-11: 2 mL via INTRA_ARTICULAR

## 2023-05-11 NOTE — Progress Notes (Signed)
 Chief Complaint: Left knee pain and swelling     History of Present Illness:    HETHER Burton is a 63 y.o. female here today for acute pain and swelling of the left knee.  She states that this began over the past 4 days without any known injury or cause.  Pain has been slowly increasing which she states is now moderate to severe and is worse with weightbearing.  She does have known left knee osteoarthritis and has managed this in the past with cortisone injections, last being on 10/12/2022 with Karenann Cai, PA-C.  X-rays also taken at that time demonstrates that this is moderate and most progressed within the medial compartment.  She denies any fever or chills.  No history of diabetes but does report prior episodes of gout.  Surgical History:   None  PMH/PSH/Family History/Social History/Meds/Allergies:    Past Medical History:  Diagnosis Date   Allergy    Environmental allergies    Past Surgical History:  Procedure Laterality Date   COLONOSCOPY  10/12/2011   Pyrtle   OVARIAN CYST REMOVAL     POLYPECTOMY     TUBAL LIGATION  1985   Social History   Socioeconomic History   Marital status: Single    Spouse name: Not on file   Number of children: Not on file   Years of education: Not on file   Highest education level: Not on file  Occupational History   Not on file  Tobacco Use   Smoking status: Never   Smokeless tobacco: Never  Vaping Use   Vaping status: Never Used  Substance and Sexual Activity   Alcohol use: No   Drug use: No   Sexual activity: Yes    Birth control/protection: None  Other Topics Concern   Not on file  Social History Narrative   Not on file   Social Drivers of Health   Financial Resource Strain: Not on file  Food Insecurity: Not on file  Transportation Needs: Not on file  Physical Activity: Not on file  Stress: Not on file  Social Connections: Not on file   Family History  Problem Relation Age of  Onset   Colon cancer Mother        unsure age   Hypertension Father    Early death Sister    High blood pressure Sister    Colon polyps Neg Hx    Esophageal cancer Neg Hx    Rectal cancer Neg Hx    Stomach cancer Neg Hx    No Known Allergies Current Outpatient Medications  Medication Sig Dispense Refill   ciprofloxacin (CIPRO) 500 MG tablet Take 1 tablet (500 mg total) by mouth 2 (two) times daily. 10 tablet 0   traMADol (ULTRAM) 50 MG tablet Take 1 tablet (50 mg total) by mouth every 12 (twelve) hours as needed. (Patient not taking: Reported on 02/15/2023) 30 tablet 0   No current facility-administered medications for this visit.   No results found.  Review of Systems:   A ROS was performed including pertinent positives and negatives as documented in the HPI.  Physical Exam :   Constitutional: NAD and appears stated age Neurological: Alert and oriented Psych: Appropriate affect and cooperative Last menstrual period 05/22/2011.   Comprehensive Musculoskeletal Exam:    Left knee exam demonstrates  presence of a mild to moderate effusion, confirmed as a moderate anechoic region on ultrasound.  Active range of motion of the left knee is from 10 to 90 degrees.  No significant overlying erythema or warmth.  No instability with varus and valgus stress.  Imaging:     Assessment:   63 y.o. female with osteoarthritis of the left knee.  X-rays reviewed from approximately 6 months ago show tricompartmental degenerative changes with moderate joint space narrowing in the medial compartment as well as diffuse osteophytes.  There does appear to be an effusion today on exam which I was able to visualize under ultrasound.  I have offered an aspiration and cortisone injection which the patient agreed to.  I was able to successfully aspirate 25 mL of cloudy synovial fluid from the superolateral approach which was then followed by cortisone injection.  Given the appearance of the fluid today as well  as past history of gout, I will plan to send this for synovial fluid analysis and will follow-up with her on results.  She can plan to follow-up as needed.  Plan :    -Left knee aspiration and cortisone injection performed today -Synovial fluid sent for analysis and will follow-up with results     Procedure Note  Patient: Mary Burton             Date of Birth: 1960-04-18           MRN: [id number]             Visit Date: 05/11/2023  Procedures: Visit Diagnoses:  1. Unilateral primary osteoarthritis, left knee   2. Effusion, left knee     Large Joint Inj: L knee on 05/11/2023 6:09 PM Indications: pain Details: 18 G 1.5 in needle, superolateral approach Medications: 4 mL lidocaine 1 %; 2 mL triamcinolone acetonide 40 MG/ML Aspirate: 25 mL cloudy; sent for lab analysis Outcome: tolerated well, no immediate complications Procedure, treatment alternatives, risks and benefits explained, specific risks discussed. Consent was given by the patient. Immediately prior to procedure a time out was called to verify the correct patient, procedure, equipment, support staff and site/side marked as required. Patient was prepped and draped in the usual sterile fashion.       I personally saw and evaluated the patient, and participated in the management and treatment plan.  Hazle Nordmann, PA-C Orthopedics

## 2023-05-17 NOTE — Progress Notes (Signed)
 Lvm advising

## 2023-07-11 ENCOUNTER — Ambulatory Visit (HOSPITAL_BASED_OUTPATIENT_CLINIC_OR_DEPARTMENT_OTHER): Admitting: Student

## 2023-07-11 ENCOUNTER — Encounter (HOSPITAL_BASED_OUTPATIENT_CLINIC_OR_DEPARTMENT_OTHER): Payer: Self-pay | Admitting: Student

## 2023-07-11 DIAGNOSIS — G8929 Other chronic pain: Secondary | ICD-10-CM

## 2023-07-11 DIAGNOSIS — M25512 Pain in left shoulder: Secondary | ICD-10-CM | POA: Diagnosis not present

## 2023-07-11 MED ORDER — TRIAMCINOLONE ACETONIDE 40 MG/ML IJ SUSP
2.0000 mL | INTRAMUSCULAR | Status: AC | PRN
Start: 2023-07-11 — End: 2023-07-11
  Administered 2023-07-11: 2 mL via INTRA_ARTICULAR

## 2023-07-11 MED ORDER — LIDOCAINE HCL 1 % IJ SOLN
4.0000 mL | INTRAMUSCULAR | Status: AC | PRN
Start: 2023-07-11 — End: 2023-07-11
  Administered 2023-07-11: 4 mL

## 2023-07-11 NOTE — Progress Notes (Signed)
 Chief Complaint: Left shoulder pain     History of Present Illness:    Mary Burton is a 63 y.o. female who presents today for left shoulder pain.  She has been seen with our practice on multiple occasions for this left shoulder over the past few years.  She has received multiple cortisone injections, both subacromial and glenohumeral with Norma Beckers Persons, PA-C and [address], PA-C.  She has deferred a previous order for a left shoulder MRI, however [facility] did perform an ultrasound evaluation in September 2024 which suggested significant tendinosis and some articular sided tearing within the supraspinatus.  Patient reports the current episode of pain has been ongoing for the last month, making it difficult to sleep over the last week.  She has trialed ibuprofen however stopped taking this due to it making her sick to her stomach.  Does have history of chronic right shoulder pain as well, however this is currently doing well.  Last injection into the left shoulder was performed on 10/19/2022.   Surgical History:   None  PMH/PSH/Family History/Social History/Meds/Allergies:    Past Medical History:  Diagnosis Date   Allergy    Environmental allergies    Past Surgical History:  Procedure Laterality Date   COLONOSCOPY  10/12/2011   Pyrtle   OVARIAN CYST REMOVAL     POLYPECTOMY     TUBAL LIGATION  1985   Social History   Socioeconomic History   Marital status: Single    Spouse name: Not on file   Number of children: Not on file   Years of education: Not on file   Highest education level: Not on file  Occupational History   Not on file  Tobacco Use   Smoking status: Never   Smokeless tobacco: Never  Vaping Use   Vaping status: Never Used  Substance and Sexual Activity   Alcohol use: No   Drug use: No   Sexual activity: Yes    Birth control/protection: None  Other Topics Concern   Not on file  Social History Narrative   Not on  file   Social Drivers of Health   Financial Resource Strain: Not on file  Food Insecurity: Not on file  Transportation Needs: Not on file  Physical Activity: Not on file  Stress: Not on file  Social Connections: Not on file   Family History  Problem Relation Age of Onset   Colon cancer Mother        unsure age   Hypertension Father    Early death Sister    High blood pressure Sister    Colon polyps Neg Hx    Esophageal cancer Neg Hx    Rectal cancer Neg Hx    Stomach cancer Neg Hx    No Known Allergies Current Outpatient Medications  Medication Sig Dispense Refill   ciprofloxacin  (CIPRO ) 500 MG tablet Take 1 tablet (500 mg total) by mouth 2 (two) times daily. 10 tablet 0   traMADol  (ULTRAM ) 50 MG tablet Take 1 tablet (50 mg total) by mouth every 12 (twelve) hours as needed. (Patient not taking: Reported on 02/15/2023) 30 tablet 0   No current facility-administered medications for this visit.   No results found.  Review of Systems:   A ROS was performed including pertinent positives and negatives as documented in  the HPI.  Physical Exam :   Constitutional: NAD and appears stated age Neurological: Alert and oriented Psych: Appropriate affect and cooperative Last menstrual period 05/22/2011.   Comprehensive Musculoskeletal Exam:    Left shoulder exam demonstrates point tenderness over the anterior glenohumeral joint and lateral deltoid.  Active shoulder range of motion is painful to 90 degrees forward flexion and 30 degrees external rotation.  Passive shoulder flexion limited past 90 degrees due to patient discomfort.  Positive Neer and Hawkins.  Imaging:     Assessment:   63 y.o. female with chronic left shoulder pain that she has been managing with cortisone injections over the past few years.  Last injection was performed into the glenohumeral joint on 10/19/2022 which she states did not provide as much relief as prior subacromial injection.  Patient did inquire today  about potential for pain management, although I discussed that I would like to have her follow-up with Dr. Rozelle Corning or [facility] to discuss her shoulders further, to determine if the source of her pain can be addressed before proceeding with the pain management route.  She does have range of motion deficits and weakness consistent with prior diagnoses of supraspinatus tendinosis/tearing, although does exhibit some symptoms potentially concerning for adhesive capsulitis.  Given that she has reportedly done well with a subacromial injection in the past, I have offered to repeat this today and patient would like to proceed.  Injection was performed into the left subacromial space which she tolerated well.  Recommend that she schedule a follow-up appointment with [facility] Gelin[facility] or Dr. Rozelle Corning to discuss possible management options for the shoulders moving forward.  Plan :    - Left shoulder subacromial cortisone injection performed today after patient consent - Follow-up with Prentis Brock, PA-C/Dr. Rozelle Corning for further treatment discussion     Procedure Note  Patient: Mary Burton             Date of Birth: [birth year]           MRN: [id number]             Visit Date: 07/11/2023  Procedures: Visit Diagnoses:  1. Chronic left shoulder pain     Large Joint Inj: L subacromial bursa on 07/11/2023 4:56 PM Indications: pain Details: 22 G 1.5 in needle, posterior approach Medications: 4 mL lidocaine  1 %; 2 mL triamcinolone  acetonide 40 MG/ML Outcome: tolerated well, no immediate complications Procedure, treatment alternatives, risks and benefits explained, specific risks discussed. Consent was given by the patient. Immediately prior to procedure a time out was called to verify the correct patient, procedure, equipment, support staff and site/side marked as required. Patient was prepped and draped in the usual sterile fashion.       I personally saw and evaluated the patient, and participated in the management and  treatment plan.  Sharrell Deck, PA-C Orthopedics

## 2023-08-24 ENCOUNTER — Ambulatory Visit: Admitting: Internal Medicine

## 2023-08-25 ENCOUNTER — Other Ambulatory Visit (HOSPITAL_COMMUNITY): Payer: Self-pay

## 2023-08-25 ENCOUNTER — Other Ambulatory Visit: Payer: Self-pay | Admitting: Internal Medicine

## 2023-08-25 ENCOUNTER — Ambulatory Visit: Payer: Self-pay | Admitting: Internal Medicine

## 2023-08-25 ENCOUNTER — Encounter: Payer: Self-pay | Admitting: Internal Medicine

## 2023-08-25 ENCOUNTER — Ambulatory Visit: Admitting: Internal Medicine

## 2023-08-25 VITALS — BP 116/74 | HR 81 | Temp 97.6°F | Ht 72.0 in | Wt 218.4 lb

## 2023-08-25 DIAGNOSIS — J302 Other seasonal allergic rhinitis: Secondary | ICD-10-CM

## 2023-08-25 DIAGNOSIS — R7303 Prediabetes: Secondary | ICD-10-CM

## 2023-08-25 DIAGNOSIS — R109 Unspecified abdominal pain: Secondary | ICD-10-CM

## 2023-08-25 LAB — URINALYSIS, ROUTINE W REFLEX MICROSCOPIC
Nitrite: NEGATIVE
Specific Gravity, Urine: 1.025 (ref 1.000–1.030)
Urine Glucose: NEGATIVE
Urobilinogen, UA: 1 (ref 0.0–1.0)
pH: 6 (ref 5.0–8.0)

## 2023-08-25 MED ORDER — TRAMADOL HCL 50 MG PO TABS
50.0000 mg | ORAL_TABLET | Freq: Four times a day (QID) | ORAL | 0 refills | Status: DC | PRN
  Filled 2023-08-25: qty 30, 8d supply, fill #0

## 2023-08-25 MED ORDER — CIPROFLOXACIN HCL 500 MG PO TABS
500.0000 mg | ORAL_TABLET | Freq: Two times a day (BID) | ORAL | 0 refills | Status: AC
  Filled 2023-08-25: qty 20, 10d supply, fill #0

## 2023-08-25 NOTE — Progress Notes (Signed)
 Patient ID: Mary Burton, female   DOB: 05/23/1960, 63 y.o.   MRN: [id number]        Chief Complaint: follow up right flank pain and urine odor, preDM, allergies       HPI:  Mary Burton is a 63 y.o. female here with c/o 2 days onset right flank pain  - not sore to touch, but has urine concentrated it seems and odorous unusual.  Denies urinary symptoms such as dysuria, frequency, urgency,  hematuria or n/v, fever, chills.   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  Pt denies chest pain, increased sob or doe, wheezing, orthopnea, PND, increased LE swelling, palpitations, dizziness or syncope.   Pt denies polydipsia, polyuria, or new focal neuro s/s.   Does have several wks ongoing nasal allergy symptoms with clearish congestion, itch and sneezing, without fever, pain, ST, cough, swelling or wheezing.       Wt Readings from Last 3 Encounters:  08/25/23 218 lb 6 oz (99.1 kg)  02/15/23 226 lb (102.5 kg)  10/31/22 224 lb (101.6 kg)   BP Readings from Last 3 Encounters:  08/25/23 116/74  02/15/23 124/76  12/11/22 (!) 145/89         Past Medical History:  Diagnosis Date   Allergy    Environmental allergies    Past Surgical History:  Procedure Laterality Date   COLONOSCOPY  10/12/2011   Pyrtle   OVARIAN CYST REMOVAL     POLYPECTOMY     TUBAL LIGATION  1985    reports that she has never smoked. She has never used smokeless tobacco. She reports that she does not drink alcohol and does not use drugs. family history includes Colon cancer in her mother; Early death in her sister; High blood pressure in her sister; Hypertension in her father. No Known Allergies No current outpatient medications on file prior to visit.   No current facility-administered medications on file prior to visit.        ROS:  All others reviewed and negative.  Objective        PE:  BP 116/74   Pulse 81   Temp 97.6 F (36.4 C) (Temporal)    Ht 6' (1.829 m)   Wt 218 lb 6 oz (99.1 kg)   LMP 05/22/2011   SpO2 97%   BMI 29.62 kg/m                 Constitutional: Pt appears in NAD               HENT: Head: NCAT.                Right Ear: External ear normal.                 Left Ear: External ear normal.                Eyes: . Pupils are equal, round, and reactive to light. Conjunctivae and EOM are normal               Nose: without d/c or deformity               Neck: Neck supple. Gross normal ROM               Cardiovascular: Normal rate and regular rhythm.                 Pulmonary/Chest: Effort  normal and breath sounds without rales or wheezing.                Abd:  Soft, NT, ND, + BS, no organomegaly               Neurological: Pt is alert. At baseline orientation, motor grossly intact               Skin: Skin is warm. No rashes, no other new lesions, LE edema - none               Psychiatric: Pt behavior is normal without agitation   Micro: none  Cardiac tracings I have personally interpreted today:  none  Pertinent Radiological findings (summarize): none   Lab Results  Component Value Date   WBC 5.1 10/06/2021   HGB 12.0 10/06/2021   HCT 36.9 10/06/2021   PLT 201.0 10/06/2021   GLUCOSE 92 10/06/2021   CHOL 141 10/06/2021   TRIG 110.0 10/06/2021   HDL 52.90 10/06/2021   LDLCALC 67 10/06/2021   ALT 13 10/06/2021   AST 15 10/06/2021   NA 140 10/06/2021   K 4.1 10/06/2021   CL 103 10/06/2021   CREATININE 0.51 10/06/2021   BUN 14 10/06/2021   CO2 30 10/06/2021   TSH 0.88 10/06/2021   HGBA1C 6.0 10/06/2021   Assessment/Plan:  Mary Burton is a 63 y.o. Black or African American [2] female with  has a past medical history of Allergy and Environmental allergies.  Seasonal allergies Mild to mod, for otc nasacort  asd,  to f/u any worsening symptoms or concerns   Right flank pain With suspicion for early acute pyelonephritis though no fever or flank tender,  for ua and cx, also cipro  500 bid  course asd, also for tramadol  limited rx prn pain  Prediabetes Lab Results  Component Value Date   HGBA1C 6.0 10/06/2021   Stable, pt to continue current medical treatment  - diet,wt control  Followup: Return if symptoms worsen or fail to improve.  Lynwood Rush, MD 08/25/2023 12:45 PM [facility]

## 2023-08-25 NOTE — Assessment & Plan Note (Signed)
Mild to mod, for otc nasacort asd, to f/u any worsening symptoms or concerns ?

## 2023-08-25 NOTE — Patient Instructions (Signed)
 Please take all new medication as prescribed - the pain medication and antibiotic  Please continue all other medications as before, and refills have been done if requested.  Please have the pharmacy call with any other refills you may need  Please keep your appointments with your specialists as you may have planned  Please go to the LAB at the blood drawing area for the tests to be done  - just the urine testing today  You will be contacted by phone if any changes need to be made immediately.  Otherwise, you will receive a letter about your results with an explanation, but please check with MyChart first.

## 2023-08-25 NOTE — Assessment & Plan Note (Signed)
 With suspicion for early acute pyelonephritis though no fever or flank tender,  for ua and cx, also cipro  500 bid course asd, also for tramadol  limited rx prn pain

## 2023-08-25 NOTE — Assessment & Plan Note (Signed)
 Lab Results  Component Value Date   HGBA1C 6.0 10/06/2021   Stable, pt to continue current medical treatment  - diet,wt control

## 2023-08-26 LAB — URINE CULTURE: Result:: NO GROWTH

## 2023-08-28 ENCOUNTER — Other Ambulatory Visit: Payer: Self-pay

## 2023-08-28 ENCOUNTER — Encounter: Payer: Self-pay | Admitting: Surgical

## 2023-08-28 ENCOUNTER — Ambulatory Visit: Admitting: Surgical

## 2023-08-28 DIAGNOSIS — G8929 Other chronic pain: Secondary | ICD-10-CM | POA: Diagnosis not present

## 2023-08-28 DIAGNOSIS — M25512 Pain in left shoulder: Secondary | ICD-10-CM | POA: Diagnosis not present

## 2023-08-28 DIAGNOSIS — M7522 Bicipital tendinitis, left shoulder: Secondary | ICD-10-CM | POA: Diagnosis not present

## 2023-08-28 DIAGNOSIS — M7521 Bicipital tendinitis, right shoulder: Secondary | ICD-10-CM

## 2023-08-29 ENCOUNTER — Other Ambulatory Visit (HOSPITAL_COMMUNITY): Payer: Self-pay

## 2023-08-30 ENCOUNTER — Encounter: Payer: Self-pay | Admitting: Surgical

## 2023-08-30 MED ORDER — LIDOCAINE HCL 1 % IJ SOLN
5.0000 mL | INTRAMUSCULAR | Status: AC | PRN
  Administered 2023-08-28: 5 mL

## 2023-08-30 MED ORDER — TRIAMCINOLONE ACETONIDE 40 MG/ML IJ SUSP
40.0000 mg | INTRAMUSCULAR | Status: AC | PRN
  Administered 2023-08-28: 40 mg via INTRA_ARTICULAR

## 2023-08-30 MED ORDER — BUPIVACAINE HCL 0.25 % IJ SOLN
9.0000 mL | INTRAMUSCULAR | Status: AC | PRN
  Administered 2023-08-28: 9 mL via INTRA_ARTICULAR

## 2023-08-30 NOTE — Progress Notes (Signed)
 Office Visit Note   Patient: Mary Burton           Date of Birth: 1961/01/05           MRN: [id number] Visit Date: 08/28/2023 Requested by: Lendia Boby CROME, NP-C [address] [place],  [place] [address] PCP: Lendia Boby CROME, NP-C  Subjective: Chief Complaint  Patient presents with   Left Shoulder - Pain    HPI: Mary Burton is a 63 y.o. female who presents to the office reporting bilateral shoulder pain.  Patient has history of left shoulder pain that has responded well to glenohumeral injection.  She had recent left subacromial injection with Leonce Reveal, PA-C on 07/11/2023.  This helped for about a month before pain recurred and was not as helpful as glenohumeral injection as she has had in the past.  She has had increased pain over the last week primarily with both shoulders.  She has used some ice and Ace bandage which has helped with a lot of her pain.  She has described primarily constant pain and has tried some Voltaren  gel without much relief.  She would like to try glenohumeral injection again today for both shoulders as this has been the most helpful for her.  .                ROS: All systems reviewed are negative as they relate to the chief complaint within the history of present illness.  Patient denies fevers or chills.  Assessment & Plan: Visit Diagnoses:  1. Chronic left shoulder pain     Plan: Impression is 63 year old female who has history of left shoulder pain.  Similar to her right shoulder pain which has demonstrated tendinosis of the supraspinatus and intra-articular long head of the bicep tendinosis on prior MRI scan.  May have similar pathology ongoing in the left shoulder but with the asymmetric weakness she has testing her rotator cuff strength, need further evaluation of the left shoulder with imaging before we can proceed with another shoulder injection.  Plan to inject the left shoulder with Toradol  today and inject the right shoulder with  cortisone.  Tolerated both injections well without complication.  Prescribed Valium to help with claustrophobia during MRI scan.  Follow-up after MRI to review results.  Follow-Up Instructions: No follow-ups on file.   Orders:  Orders Placed This Encounter  Procedures   US  Guided Needle Placement - No Linked Charges   MR Shoulder Left w/ contrast   Arthrogram   No orders of the defined types were placed in this encounter.     Procedures: Large Joint Inj: L glenohumeral on 08/28/2023 1:43 PM Indications: pain and diagnostic evaluation Details: 22 G 3.5 in needle, ultrasound-guided posterior approach Medications: 5 mL lidocaine  1 %; 9 mL bupivacaine  0.25 % Outcome: tolerated well, no immediate complications  9 cc of bupivacaine  mixed with 1 cc Toradol  Procedure, treatment alternatives, risks and benefits explained, specific risks discussed. Consent was given by the patient. Immediately prior to procedure a time out was called to verify the correct patient, procedure, equipment, support staff and site/side marked as required. Patient was prepped and draped in the usual sterile fashion.    Large Joint Inj: R glenohumeral on 08/28/2023 1:44 PM Indications: pain and diagnostic evaluation Details: 22 G 3.5 in needle, ultrasound-guided posterior approach Medications: 5 mL lidocaine  1 %; 9 mL bupivacaine  0.25 %; 40 mg triamcinolone  acetonide 40 MG/ML Outcome: tolerated well, no immediate complications Procedure, treatment alternatives, risks  and benefits explained, specific risks discussed. Consent was given by the patient. Immediately prior to procedure a time out was called to verify the correct patient, procedure, equipment, support staff and site/side marked as required. Patient was prepped and draped in the usual sterile fashion.       Clinical Data: No additional findings.  Objective: Vital Signs: LMP 05/22/2011   Physical Exam:  Constitutional: Patient appears  well-developed HEENT:  Head: Normocephalic Eyes:EOM are normal Neck: Normal range of motion Cardiovascular: Normal rate Pulmonary/chest: Effort normal Neurologic: Patient is alert Skin: Skin is warm Psychiatric: Patient has normal mood and affect  Ortho Exam: Ortho exam demonstrates left shoulder with 45 degrees X rotation, 90 degrees abduction, 160 degrees forward elevation passively and actively.  Does have crepitus noted in the anterolateral region of the left shoulder with passive motion of the shoulder.  Mild to moderate tenderness over the [facility] joint of the left shoulder.  Similar tenderness over the bicipital groove.  Intact EPL, FPL, finger abduction, pronation/supination, bicep, tricep, deltoid.  Axillary nerve intact with deltoid firing.  Supra and infra strength rated 4/5.  Subscap strength rated 5/5 the left shoulder.  Negative external rotation lag sign.  Negative Hornblower sign.  Positive Neer sign.  Negative Hawkin sign.  Specialty Comments:  No specialty comments available.  Imaging: No results found.   PMFS History: Patient Active Problem List   Diagnosis Date Noted   Right flank pain 08/25/2023   Pain in left shoulder 08/18/2022   Acute cystitis with hematuria 10/06/2021   Prediabetes 10/06/2021   Obesity (BMI 30.0-34.9) 10/06/2021   Impingement syndrome of right shoulder 07/02/2021   Bilateral foot pain 04/02/2014   Bilateral swelling of feet 06/06/2011   Seasonal allergies 06/06/2011   Bilateral knee pain 06/06/2011   Past Medical History:  Diagnosis Date   Allergy    Environmental allergies     Family History  Problem Relation Age of Onset   Colon cancer Mother        unsure age   Hypertension Father    Early death Sister    High blood pressure Sister    Colon polyps Neg Hx    Esophageal cancer Neg Hx    Rectal cancer Neg Hx    Stomach cancer Neg Hx     Past Surgical History:  Procedure Laterality Date   COLONOSCOPY  10/12/2011   Pyrtle    OVARIAN CYST REMOVAL     POLYPECTOMY     TUBAL LIGATION  1985   Social History   Occupational History   Not on file  Tobacco Use   Smoking status: Never   Smokeless tobacco: Never  Vaping Use   Vaping status: Never Used  Substance and Sexual Activity   Alcohol use: No   Drug use: No   Sexual activity: Yes    Birth control/protection: None

## 2023-08-31 ENCOUNTER — Encounter: Payer: Self-pay | Admitting: Surgical

## 2023-09-02 ENCOUNTER — Other Ambulatory Visit (HOSPITAL_COMMUNITY): Payer: Self-pay

## 2023-09-12 ENCOUNTER — Telehealth: Payer: Self-pay | Admitting: Radiology

## 2023-09-12 NOTE — Telephone Encounter (Signed)
 I left voicemail for patient. She needs to be scheduled for MRI review follow up. I advised she could come in and see Herlene on Friday morning where he has open slots for her hand as well if she would like. Please schedule patient when she calls back.

## 2023-09-12 NOTE — Telephone Encounter (Signed)
 error

## 2023-09-12 NOTE — Telephone Encounter (Signed)
 Patient left voicemail on triage. States she's scheduled from MRI of left shoulder tomorrow.  She's now having throbbing pain in right hand from only using right arm/hand now. Patient wants to know does she need an appointment now for the right hand or wait until after MRI.   Call back number [phone]

## 2023-09-13 ENCOUNTER — Ambulatory Visit
Admission: RE | Admit: 2023-09-13 | Discharge: 2023-09-13 | Disposition: A | Discharge status: Home / Self Care | Source: Ambulatory Visit | Attending: Surgical | Admitting: Surgical

## 2023-09-13 ENCOUNTER — Inpatient Hospital Stay
Admission: RE | Admit: 2023-09-13 | Discharge: 2023-09-13 | Disposition: A | Discharge status: Home / Self Care | Source: Ambulatory Visit | Attending: Surgical

## 2023-09-13 DIAGNOSIS — G8929 Other chronic pain: Secondary | ICD-10-CM

## 2023-09-13 DIAGNOSIS — M25512 Pain in left shoulder: Secondary | ICD-10-CM | POA: Diagnosis not present

## 2023-09-13 DIAGNOSIS — M19012 Primary osteoarthritis, left shoulder: Secondary | ICD-10-CM | POA: Diagnosis not present

## 2023-09-13 MED ORDER — IOPAMIDOL (ISOVUE-M 200) INJECTION 41%
10.0000 mL | Freq: Once | INTRAMUSCULAR | Status: AC
  Administered 2023-09-13: 10 mL via INTRA_ARTICULAR

## 2023-09-13 NOTE — Telephone Encounter (Signed)
 Sounds good to me.  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  We can check this out on Friday.

## 2023-09-14 NOTE — Telephone Encounter (Signed)
 Noted

## 2023-09-15 ENCOUNTER — Other Ambulatory Visit (HOSPITAL_COMMUNITY): Payer: Self-pay

## 2023-09-15 ENCOUNTER — Ambulatory Visit: Admitting: Surgical

## 2023-09-15 DIAGNOSIS — M25512 Pain in left shoulder: Secondary | ICD-10-CM

## 2023-09-15 DIAGNOSIS — M7522 Bicipital tendinitis, left shoulder: Secondary | ICD-10-CM

## 2023-09-15 DIAGNOSIS — M25531 Pain in right wrist: Secondary | ICD-10-CM

## 2023-09-15 DIAGNOSIS — G8929 Other chronic pain: Secondary | ICD-10-CM

## 2023-09-15 DIAGNOSIS — M75102 Unspecified rotator cuff tear or rupture of left shoulder, not specified as traumatic: Secondary | ICD-10-CM | POA: Diagnosis not present

## 2023-09-15 MED ORDER — PREDNISONE 10 MG (21) PO TBPK
ORAL_TABLET | ORAL | 0 refills | Status: DC
  Filled 2023-09-15: qty 21, 6d supply, fill #0

## 2023-09-15 MED ORDER — COLCHICINE 0.6 MG PO TABS
ORAL_TABLET | ORAL | 0 refills | Status: DC
  Filled 2023-09-15: qty 5, 3d supply, fill #0

## 2023-09-17 ENCOUNTER — Encounter: Payer: Self-pay | Admitting: Surgical

## 2023-09-17 NOTE — Progress Notes (Signed)
 Office Visit Note   Patient: Mary Burton           Date of Birth: 1961-02-04           MRN: [id number] Visit Date: 09/15/2023 Requested by: Lendia Boby CROME, NP-C [address] [place],  [place] [address] PCP: Lendia Boby CROME, NP-C  Subjective: Chief Complaint  Patient presents with   Left Shoulder - Pain, Follow-up    MRI review   Right Hand - Pain   Right Knee - Pain    HPI: Mary Burton is a 63 y.o. female who presents to the office reporting right hand pain and left shoulder pain.  Here to review left shoulder MRI scan.  Reports continued left shoulder pain with severe pain in the anterolateral aspect of the shoulder.  She has difficulty lifting her arm due to pain.  Pain wakes her up every night when she tries to sleep.  Denies any significant mechanical symptoms in the shoulder.  Also describes right hand and wrist pain.  Began in her wrist and has extended into the hand with difficulty making a fist.  No fevers or chills.  No injury.  Taking over-the-counter medications without relief.  Does have history of 1 or 2 prior gout attacks but this has been long ago.  No history of prior hand surgery..                ROS: All systems reviewed are negative as they relate to the chief complaint within the history of present illness.  Patient denies fevers or chills.  Assessment & Plan: Visit Diagnoses:  1. Tear of left supraspinatus tendon   2. Chronic left shoulder pain   3. Biceps tendinitis, left   4. Pain in right wrist     Plan: Impression is 63 year old female with sudden onset of right wrist pain about a week ago that has persisted.  Has had a little bit of relief but not much overall.  Does have a history of prior gout attacks so could be another gout attack.  She is not taking allopurinol.  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  She will do  physical therapy upstairs for left frozen shoulder prior to upcoming procedure.  Regarding the left shoulder, we discussed MRI which demonstrates high-grade partial-thickness tear of the supraspinatus and infraspinatus.  We discussed options available to patient and with her weakness and severity of pain, think that her best option long-term would be rotator cuff tear repair.  She has had prior glenohumeral injection 1 time in the past that did give her good relief but that was greater than 3 months ago.  We discussed risks and benefits of the procedure including but not limited to the risk of nerve/blood vessel damage, infection, lengthy recovery, need for revision surgery, continued pain.  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  Plan to post her for left shoulder arthroscopy with mini open bicep tenodesis and rotator cuff tear repair.  Follow-up after procedure.  She denies any significant medical history or any cardiac history.  Follow-Up Instructions: No follow-ups on file.   Orders:  Orders Placed This Encounter  Procedures   Ambulatory referral  to Physical Therapy   Meds ordered this encounter  Medications   predniSONE  (STERAPRED UNI-PAK 21 TAB) 10 MG (21) TBPK tablet    Sig: Take as directed on package    Dispense:  21 tablet    Refill:  0   colchicine  0.6 MG tablet    Sig: Take 2 tablets (1.2 mg total) by mouth daily for 1 day, THEN 1 tablet (0.6 mg total) daily for 2 days.    Dispense:  5 tablet    Refill:  0      Procedures: No procedures performed   Clinical Data: No additional findings.  Objective: Vital Signs: LMP 05/22/2011   Physical Exam:  Constitutional: Patient appears well-developed HEENT:  Head: Normocephalic Eyes:EOM are normal Neck: Normal range of motion Cardiovascular: Normal rate Pulmonary/chest:  Effort normal Neurologic: Patient is alert Skin: Skin is warm Psychiatric: Patient has normal mood and affect  Ortho Exam: Ortho exam demonstrates right hand with mild swelling in the wrist relative to the left wrist.  Palpable radial pulse.  Able to make full composite fist in the left wrist but not the right hand.  Intact EPL, FPL, finger abduction.  There is no cellulitis or significant fusiform swelling noted of any of the digits.  No triggering noted of any of the digits.  Tender diffusely throughout the wrist but primarily over the 3-4 portal and anatomic snuffbox as well as the base of the first CMC.  There is no asymmetric warmth compared with the contralateral side.  Regarding the left shoulder, range of motion evaluation is limited due to pain, much more so than prior exam which may signify start of adhesive capsulitis.  She has palpable radial pulse of the left upper extremity.  Intact EPL, FPL, finger abduction, pronation/supination, bicep, tricep, deltoid.  No Popeye deformity noted.  Axillary nerve intact with deltoid firing.  She has weakness of external rotation rated 4/5.  Subscap strength and abduction strength rated 5/5 though abduction strength testing reproduces her pain.  She has no tenderness over the [facility] joint.  Does have tenderness over the bicipital groove.  Specialty Comments:  No specialty comments available.  Imaging: No results found.   PMFS History: Patient Active Problem List   Diagnosis Date Noted   Right flank pain 08/25/2023   Pain in left shoulder 08/18/2022   Acute cystitis with hematuria 10/06/2021   Prediabetes 10/06/2021   Obesity (BMI 30.0-34.9) 10/06/2021   Impingement syndrome of right shoulder 07/02/2021   Bilateral foot pain 04/02/2014   Bilateral swelling of feet 06/06/2011   Seasonal allergies 06/06/2011   Bilateral knee pain 06/06/2011   Past Medical History:  Diagnosis Date   Allergy    Environmental allergies     Family History   Problem Relation Age of Onset   Colon cancer Mother        unsure age   Hypertension Father    Early death Sister    High blood pressure Sister    Colon polyps Neg Hx    Esophageal cancer Neg Hx    Rectal cancer Neg Hx    Stomach cancer Neg Hx     Past Surgical History:  Procedure Laterality Date   COLONOSCOPY  10/12/2011   Pyrtle   OVARIAN CYST REMOVAL     POLYPECTOMY     TUBAL LIGATION  1985   Social History   Occupational History   Not on file  Tobacco Use   Smoking status: Never   Smokeless tobacco: Never  Vaping Use   Vaping status: Never Used  Substance and Sexual Activity   Alcohol use: No   Drug use: No   Sexual activity: Yes    Birth control/protection: None

## 2023-09-21 ENCOUNTER — Ambulatory Visit: Payer: Self-pay

## 2023-09-21 NOTE — Telephone Encounter (Signed)
 Copied from CRM [id number]. Topic: Clinical - Medical Advice >> Sep 21, 2023  1:14 PM Jayma L wrote: Reason for CRM:   patient called in stated her surgeon sent over paperwork she's to have surgery on 10/03/2023 , asking if the pcp has filled out the paperwork for her to get the surgery done, said she was told to make sure the pcp got that done in time. Please callback patient and let her know asap Reason for Disposition . [1] Caller requesting NON-URGENT health information AND [2] PCP's office is the best resource  Protocols used: Information Only Call - No Triage-A-AH

## 2023-09-22 ENCOUNTER — Other Ambulatory Visit

## 2023-09-22 ENCOUNTER — Telehealth: Payer: Self-pay

## 2023-09-22 NOTE — Telephone Encounter (Signed)
 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 Can you advise on this timeline for this note please

## 2023-09-22 NOTE — Telephone Encounter (Signed)
 Called and advised pt we received paperwork however she needs a visit for this to be completed. Pt has appt on 8/22 so we will compete then, pt verbalized understanding

## 2023-09-25 ENCOUNTER — Telehealth: Payer: Self-pay

## 2023-09-25 NOTE — Progress Notes (Signed)
 Surgical Instructions   Your procedure is scheduled on Tuesday, August 26th. Report to [facility] Main Entrance A at 11:00 A.M., then check in with the Admitting office. Any questions or running late day of surgery: call [phone]  Questions prior to your surgery date: call [phone], Monday-Friday, 8am-4pm. If you experience any cold or flu symptoms such as cough, fever, chills, shortness of breath, etc. between now and your scheduled surgery, please notify us  at the above number.     Remember:  Do not eat after midnight the night before your surgery   You may drink clear liquids until 10:00 the morning of your surgery.   Clear liquids allowed are: Water, Non-Citrus Juices (without pulp), Carbonated Beverages, Clear Tea (no milk, honey, etc.), Black Coffee Only (NO MILK, CREAM OR POWDERED CREAMER of any kind), and Gatorade.    Take these medicines the morning of surgery with A SIP OF WATER: NONE  One week prior to surgery, STOP taking any Aspirin (unless otherwise instructed by your surgeon) Aleve , Naproxen ,  Motrin, Goody's, BC's, all herbal medications, fish oil, and non-prescription vitamins. This includes your ibuprofen (ADVIL) . Oral Hygiene is also important to reduce your risk of infection.  Remember - BRUSH YOUR TEETH THE MORNING OF SURGERY WITH YOUR REGULAR TOOTHPASTE  [place]- Preparing for Total Shoulder Arthroplasty  Before surgery, you can play an important role. Because skin is not sterile, your skin needs to be as free of germs as possible. You can reduce the number of germs on your skin by using the following products.   Benzoyl Peroxide Gel  o Reduces the number of germs present on the skin  o Applied twice a day to shoulder area starting two days before surgery   Chlorhexidine Gluconate (CHG) Soap (instructions listed above on how to wash with CHG Soap)  o An antiseptic cleaner that kills germs and bonds with the skin to continue killing germs even  after washing  o Used for showering the night before surgery and morning of surgery   ==================================================================  Please follow these instructions carefully:  BENZOYL PEROXIDE 5% GEL  Please do not use if you have an allergy to benzoyl peroxide. If your skin becomes reddened/irritated stop using the benzoyl peroxide.  Starting two days before surgery, apply as follows:  1. Apply benzoyl peroxide in the morning and at night. Apply after taking a shower. If you are not taking a shower clean entire shoulder front, back, and side along with the armpit with a clean wet washcloth.  2. Place a quarter-sized dollop on your SHOULDER and rub in thoroughly, making sure to cover the front, back, and side of your shoulder, along with the armpit.   2 Days prior to Surgery First Dose on SUNDAY, August 24 th in the Morning Second Dose on SUNDAY, August 24 th at Night  Day Before Surgery First Dose on MONDAY, August 25 th in the Morning Night before surgery wash (entire body except face and private areas) with CHG Soap THEN Second Dose on MONDAY, August 25 th at Night   Morning of Surgery  wash BODY AGAIN with CHG Soap   4. Do NOT apply benzoyl peroxide gel on the day of surgery   [place]- Preparing For Surgery  Before surgery, you can play an important role. Because skin is not sterile, your skin needs to be as free of germs as possible. You can reduce the number of germs on your skin by washing with CHG (chlorahexidine gluconate)  Soap before surgery.  CHG is an antiseptic cleaner which kills germs and bonds with the skin to continue killing germs even after washing.     Please do not use if you have an allergy to CHG or antibacterial soaps. If your skin becomes reddened/irritated stop using the CHG.  Do not shave (including legs and underarms) for at least 48 hours prior to first CHG shower. It is OK to shave your face.  Please follow these  instructions carefully.     Shower the NIGHT BEFORE SURGERY and the MORNING OF SURGERY with CHG Soap.   If you chose to wash your hair, wash your hair first as usual with your normal shampoo. After you shampoo, rinse your hair and body thoroughly to remove the shampoo.  Then [employer] and genitals (private parts) with your normal soap and rinse thoroughly to remove soap.  After that Use CHG Soap as you would any other liquid soap. You can apply CHG directly to the skin and wash gently with a scrungie or a clean washcloth.   Apply the CHG Soap to your body ONLY FROM THE NECK DOWN.  Do not use on open wounds or open sores. Avoid contact with your eyes, ears, mouth and genitals (private parts). Wash Face and genitals (private parts)  with your normal soap.   Wash thoroughly, paying special attention to the area where your surgery will be performed.  Thoroughly rinse your body with warm water from the neck down.  DO NOT shower/wash with your normal soap after using and rinsing off the CHG Soap.  Pat yourself dry with a CLEAN TOWEL.  8. Apply the Benzoyl Peroxide only the night before surgery.  Do Not use it the morning of surgery.  Wear CLEAN PAJAMAS to bed the night before surgery  Place CLEAN SHEETS on your bed the night before your surgery  DO NOT SLEEP WITH PETS.   Day of Surgery: Take a shower with CHG soap. Wear Clean/Comfortable clothing the morning of surgery Do not apply any deodorants/lotions.   Remember to brush your teeth WITH YOUR REGULAR TOOTHPASTE.   Please read over the following fact sheets that you were given.                    Do NOT Smoke (Tobacco/Vaping) for 24 hours prior to your procedure.  If you use a CPAP at night, you may bring your mask/headgear for your overnight stay.   You will be asked to remove any contacts, glasses, piercing's, hearing aid's, dentures/partials prior to surgery. Please bring cases for these items if needed.    Patients  discharged the day of surgery will not be allowed to drive home, and someone needs to stay with them for 24 hours.  SURGICAL WAITING ROOM VISITATION Patients may have no more than 2 support people in the waiting area - these visitors may rotate.   Pre-op nurse will coordinate an appropriate time for 1 ADULT support person, who may not rotate, to accompany patient in pre-op.  Children under the age of 7 must have an adult with them who is not the patient and must remain in the main waiting area with an adult.  If the patient needs to stay at the hospital during part of their recovery, the visitor guidelines for inpatient rooms apply.  Please refer to the [facility] website for the visitor guidelines for any additional information.   If you received a COVID test during your pre-op visit  it is requested that you wear a mask when out in public, stay away from anyone that may not be feeling well and notify your surgeon if you develop symptoms. If you have been in contact with anyone that has tested positive in the last 10 days please notify you surgeon.      Pre-operative CHG Bathing Instructions   You can play a key role in reducing the risk of infection after surgery. Your skin needs to be as free of germs as possible. You can reduce the number of germs on your skin by washing with CHG (chlorhexidine gluconate) soap before surgery. CHG is an antiseptic soap that kills germs and continues to kill germs even after washing.   DO NOT use if you have an allergy to chlorhexidine/CHG or antibacterial soaps. If your skin becomes reddened or irritated, stop using the CHG and notify one of our RNs at [phone].              TAKE A SHOWER THE NIGHT BEFORE SURGERY AND THE DAY OF SURGERY    Please keep in mind the following:  DO NOT shave, including legs and underarms, 48 hours prior to surgery.   You may shave your face before/day of surgery.  Place clean sheets on your bed the night before  surgery Use a clean washcloth (not used since being washed) for each shower. DO NOT sleep with pet's night before surgery.  CHG Shower Instructions:  Wash your face and private area with normal soap. If you choose to wash your hair, wash first with your normal shampoo.  After you use shampoo/soap, rinse your hair and body thoroughly to remove shampoo/soap residue.  Turn the water OFF and apply half the bottle of CHG soap to a CLEAN washcloth.  Apply CHG soap ONLY FROM YOUR NECK DOWN TO YOUR TOES (washing for 3-5 minutes)  DO NOT use CHG soap on face, private areas, open wounds, or sores.  Pay special attention to the area where your surgery is being performed.  If you are having back surgery, having someone wash your back for you may be helpful. Wait 2 minutes after CHG soap is applied, then you may rinse off the CHG soap.  Pat dry with a clean towel  Put on clean pajamas    Additional instructions for the day of surgery: DO NOT APPLY any lotions, deodorants, cologne, or perfumes.   Do not wear jewelry or makeup Do not wear nail polish, gel polish, artificial nails, or any other type of covering on natural nails (fingers and toes) Do not bring valuables to the hospital. [facility] is not responsible for valuables/personal belongings. Put on clean/comfortable clothes.  Please brush your teeth.  Ask your nurse before applying any prescription medications to the skin.

## 2023-09-25 NOTE — Telephone Encounter (Signed)
 Called pt and informed her we are unable to complete clearance until she is seen as Boby has not seen her in office since January and she will need to be assessed. Pt has an upcoming appt on 8/22 and states she has already had [employer] set this up and is worried about the timeframe, I advised pt she will still need to wait until she is seen and we can fax it off right after her visit. Pt said she didn't think she had to be seen and that her surgery is already scheduled, I again told pt nothing will be done until her appt. Pt stated understanding

## 2023-09-25 NOTE — Telephone Encounter (Signed)
 Copied from CRM #[id number]. Topic: General - Other >> Sep 25, 2023  8:09 AM Aleatha BROCKS wrote: Reason for CRM: Patient needs NP Vickie to sign off on surgery immediately and please follow up with [employer] that they have been sent

## 2023-09-26 ENCOUNTER — Encounter (HOSPITAL_COMMUNITY)
Admission: RE | Admit: 2023-09-26 | Discharge: 2023-09-26 | Disposition: A | Discharge status: Home / Self Care | Source: Ambulatory Visit | Attending: Orthopedic Surgery | Admitting: Orthopedic Surgery

## 2023-09-26 ENCOUNTER — Encounter (HOSPITAL_COMMUNITY): Payer: Self-pay

## 2023-09-26 ENCOUNTER — Other Ambulatory Visit: Payer: Self-pay

## 2023-09-26 ENCOUNTER — Telehealth: Payer: Self-pay | Admitting: Orthopedic Surgery

## 2023-09-26 DIAGNOSIS — Z01812 Encounter for preprocedural laboratory examination: Secondary | ICD-10-CM | POA: Insufficient documentation

## 2023-09-26 DIAGNOSIS — Z01818 Encounter for other preprocedural examination: Secondary | ICD-10-CM

## 2023-09-26 HISTORY — DX: Unspecified osteoarthritis, unspecified site: M19.90

## 2023-09-26 HISTORY — DX: Gout, unspecified: M10.9

## 2023-09-26 LAB — CBC
HCT: 38 % (ref 36.0–46.0)
Hemoglobin: 12.1 g/dL (ref 12.0–15.0)
MCH: 28.5 pg (ref 26.0–34.0)
MCHC: 31.8 g/dL (ref 30.0–36.0)
MCV: 89.6 fL (ref 80.0–100.0)
Platelets: 285 K/uL (ref 150–400)
RBC: 4.24 MIL/uL (ref 3.87–5.11)
RDW: 14.3 % (ref 11.5–15.5)
WBC: 5.2 K/uL (ref 4.0–10.5)
nRBC: 0 % (ref 0.0–0.2)

## 2023-09-26 LAB — BASIC METABOLIC PANEL WITH GFR
Anion gap: 8 (ref 5–15)
BUN: 15 mg/dL (ref 8–23)
CO2: 27 mmol/L (ref 22–32)
Calcium: 8.7 mg/dL — ABNORMAL LOW (ref 8.9–10.3)
Chloride: 105 mmol/L (ref 98–111)
Creatinine, Ser: 0.61 mg/dL (ref 0.44–1.00)
GFR, Estimated: >60 mL/min (ref >60)
Glucose, Bld: 73 mg/dL (ref 70–99)
Potassium: 3.9 mmol/L (ref 3.5–5.1)
Sodium: 140 mmol/L (ref 135–145)

## 2023-09-26 NOTE — Pre-Procedure Instructions (Signed)
 Surgical Instructions     Your procedure is scheduled on Tuesday October 03, 2023.  Report to [facility] Main Entrance A at 11:00 A.M., then check in with the Admitting office. Any questions or running late day of surgery: call [phone]   Questions prior to your surgery date: call [phone], Monday-Friday, 8am-4pm. If you experience any cold or flu symptoms such as cough, fever, chills, shortness of breath, etc. between now and your scheduled surgery, please notify us  at the above number.            Remember:       Do not eat after midnight the night before your surgery     You may drink clear liquids until 10:00 the morning of your surgery.   Clear liquids allowed are: Water, Non-Citrus Juices (without pulp), Carbonated Beverages, Clear Tea (no milk, honey, etc.), Black Coffee Only (NO MILK, CREAM OR POWDERED CREAMER of any kind), and Gatorade.  Patient Instructions  The night before surgery:  No food after midnight. ONLY clear liquids after midnight  The day of surgery (if you do NOT have diabetes):  Drink ONE (1) Pre-Surgery Clear Ensure by 10 am the morning of surgery. Drink in one sitting. Do not sip.  This drink was given to you during your hospital  pre-op appointment visit.  Nothing else to drink after completing the  Pre-Surgery Clear Ensure.          If you have questions, please contact your surgeon's office.           Take these medicines the morning of surgery with A SIP OF WATER: NONE     One week prior to surgery, STOP taking any Aspirin (unless otherwise instructed by your surgeon) Aleve , Naproxen , Ibuprofen, Motrin, Advil, Goody's, BC's, all herbal medications, fish oil, and non-prescription vitamins.                     Do NOT Smoke (Tobacco/Vaping) for 24 hours prior to your procedure.   If you use a CPAP at night, you may bring your mask/headgear for your overnight stay.   You will be asked to remove any contacts, glasses, piercing's, hearing  aid's, dentures/partials prior to surgery. Please bring cases for these items if needed.    Patients discharged the day of surgery will not be allowed to drive home, and someone needs to stay with them for 24 hours.   SURGICAL WAITING ROOM VISITATION Patients may have no more than 2 support people in the waiting area - these visitors may rotate.   Pre-op nurse will coordinate an appropriate time for 1 ADULT support person, who may not rotate, to accompany patient in pre-op.  Children under the age of 52 must have an adult with them who is not the patient and must remain in the main waiting area with an adult.   If the patient needs to stay at the hospital during part of their recovery, the visitor guidelines for inpatient rooms apply.   Please refer to the [facility] website for the visitor guidelines for any additional information.     If you received a COVID test during your pre-op visit  it is requested that you wear a mask when out in public, stay away from anyone that may not be feeling well and notify your surgeon if you develop symptoms. If you have been in contact with anyone that has tested positive in the last 10 days please notify you surgeon.  Pre-operative CHG Bathing Instructions    You can play a key role in reducing the risk of infection after surgery. Your skin needs to be as free of germs as possible. You can reduce the number of germs on your skin by washing with CHG (chlorhexidine gluconate) soap before surgery. CHG is an antiseptic soap that kills germs and continues to kill germs even after washing.    DO NOT use if you have an allergy to chlorhexidine/CHG or antibacterial soaps. If your skin becomes reddened or irritated, stop using the CHG and notify one of our RNs at [phone].               TAKE A SHOWER THE NIGHT BEFORE SURGERY AND THE DAY OF SURGERY     Please keep in mind the following:  DO NOT shave, including legs and underarms, 48 hours prior to  surgery.   Place clean sheets on your bed the night before surgery Use a clean washcloth (not used since being washed) for each shower. DO NOT sleep with pet's night before surgery.   CHG Shower Instructions:  Wash your face and private area with normal soap. If you choose to wash your hair, wash first with your normal shampoo.  After you use shampoo/soap, rinse your hair and body thoroughly to remove shampoo/soap residue.  Turn the water OFF and apply half the bottle of CHG soap to a CLEAN washcloth.  Apply CHG soap ONLY FROM YOUR NECK DOWN TO YOUR TOES (washing for 3-5 minutes)  DO NOT use CHG soap on face, private areas, open wounds, or sores.  Pay special attention to the area where your surgery is being performed.  If you are having back surgery, having someone wash your back for you may be helpful. Wait 2 minutes after CHG soap is applied, then you may rinse off the CHG soap.  Pat dry with a clean towel  Put on clean pajamas     Additional instructions for the day of surgery: DO NOT APPLY any lotions, deodorants or perfumes.   Do not wear jewelry or makeup Do not wear nail polish, gel polish, artificial nails, or any other type of covering on natural nails (fingers and toes) Do not bring valuables to the hospital. [facility] is not responsible for valuables/personal belongings. Put on clean/comfortable clothes.  Please brush your teeth.  Ask your nurse before applying any prescription medications to the skin.

## 2023-09-26 NOTE — Telephone Encounter (Signed)
 Completed and faxed per shara

## 2023-09-26 NOTE — Progress Notes (Signed)
 Surgical Instructions   Your procedure is scheduled on Tuesday October 03, 2023. Report to [facility] Main Entrance A at 11:00 A.M., then check in with the Admitting office. Any questions or running late day of surgery: call [phone]  Questions prior to your surgery date: call [phone], Monday-Friday, 8am-4pm. If you experience any cold or flu symptoms such as cough, fever, chills, shortness of breath, etc. between now and your scheduled surgery, please notify us  at the above number.     Remember:  Do not eat after midnight the night before your surgery   You may drink clear liquids until 10:00 the morning of your surgery.   Clear liquids allowed are: Water, Non-Citrus Juices (without pulp), Carbonated Beverages, Clear Tea (no milk, honey, etc.), Black Coffee Only (NO MILK, CREAM OR POWDERED CREAMER of any kind), and Gatorade.    Take these medicines the morning of surgery with A SIP OF WATER: NONE   One week prior to surgery, STOP taking any Aspirin (unless otherwise instructed by your surgeon) Aleve , Naproxen , Ibuprofen, Motrin, Advil, Goody's, BC's, all herbal medications, fish oil, and non-prescription vitamins.                     Do NOT Smoke (Tobacco/Vaping) for 24 hours prior to your procedure.  If you use a CPAP at night, you may bring your mask/headgear for your overnight stay.   You will be asked to remove any contacts, glasses, piercing's, hearing aid's, dentures/partials prior to surgery. Please bring cases for these items if needed.    Patients discharged the day of surgery will not be allowed to drive home, and someone needs to stay with them for 24 hours.  SURGICAL WAITING ROOM VISITATION Patients may have no more than 2 support people in the waiting area - these visitors may rotate.   Pre-op nurse will coordinate an appropriate time for 1 ADULT support person, who may not rotate, to accompany patient in pre-op.  Children under the age of 17 must have an  adult with them who is not the patient and must remain in the main waiting area with an adult.  If the patient needs to stay at the hospital during part of their recovery, the visitor guidelines for inpatient rooms apply.  Please refer to the [facility] website for the visitor guidelines for any additional information.   If you received a COVID test during your pre-op visit  it is requested that you wear a mask when out in public, stay away from anyone that may not be feeling well and notify your surgeon if you develop symptoms. If you have been in contact with anyone that has tested positive in the last 10 days please notify you surgeon.      Pre-operative CHG Bathing Instructions   You can play a key role in reducing the risk of infection after surgery. Your skin needs to be as free of germs as possible. You can reduce the number of germs on your skin by washing with CHG (chlorhexidine gluconate) soap before surgery. CHG is an antiseptic soap that kills germs and continues to kill germs even after washing.   DO NOT use if you have an allergy to chlorhexidine/CHG or antibacterial soaps. If your skin becomes reddened or irritated, stop using the CHG and notify one of our RNs at [phone].              TAKE A SHOWER THE NIGHT BEFORE SURGERY AND THE DAY OF SURGERY  Please keep in mind the following:  DO NOT shave, including legs and underarms, 48 hours prior to surgery.   Place clean sheets on your bed the night before surgery Use a clean washcloth (not used since being washed) for each shower. DO NOT sleep with pet's night before surgery.  CHG Shower Instructions:  Wash your face and private area with normal soap. If you choose to wash your hair, wash first with your normal shampoo.  After you use shampoo/soap, rinse your hair and body thoroughly to remove shampoo/soap residue.  Turn the water OFF and apply half the bottle of CHG soap to a CLEAN washcloth.  Apply CHG soap ONLY FROM  YOUR NECK DOWN TO YOUR TOES (washing for 3-5 minutes)  DO NOT use CHG soap on face, private areas, open wounds, or sores.  Pay special attention to the area where your surgery is being performed.  If you are having back surgery, having someone wash your back for you may be helpful. Wait 2 minutes after CHG soap is applied, then you may rinse off the CHG soap.  Pat dry with a clean towel  Put on clean pajamas    Additional instructions for the day of surgery: DO NOT APPLY any lotions, deodorants or perfumes.   Do not wear jewelry or makeup Do not wear nail polish, gel polish, artificial nails, or any other type of covering on natural nails (fingers and toes) Do not bring valuables to the hospital. [facility] is not responsible for valuables/personal belongings. Put on clean/comfortable clothes.  Please brush your teeth.  Ask your nurse before applying any prescription medications to the skin.

## 2023-09-26 NOTE — Progress Notes (Signed)
 PCP - Lendia Nordmann, NP Cardiologist - denies  PPM/ICD - denies Device Orders - n/a Rep Notified - n/a  Chest x-ray - denies EKG - denies Stress Test - denies ECHO - denies Cardiac Cath - denies  Sleep Study - denies CPAP - n/a  Fasting Blood Sugar - no DM Checks Blood Sugar _____ times a day  Last dose of GLP1 agonist-  n/a GLP1 instructions: n/a  Blood Thinner Instructions: n/a Aspirin Instructions: n/a  ERAS Protcol - till 10 am PRE-SURGERY Ensure or G2- Ennsure  COVID TEST- n/a   Anesthesia review: no  Patient denies shortness of breath, fever, cough and chest pain at PAT appointment   All instructions explained to the patient, with a verbal understanding of the material. Patient agrees to go over the instructions while at home for a better understanding. Patient also instructed to self quarantine after being tested for COVID-19. The opportunity to ask questions was provided.

## 2023-09-26 NOTE — Telephone Encounter (Signed)
 Pt came and submitted medical release form for both part time FMLA and Full Time FMLA [employer] Employee and paid d $40.00

## 2023-09-27 ENCOUNTER — Telehealth: Payer: Self-pay | Admitting: Orthopedic Surgery

## 2023-09-27 NOTE — Telephone Encounter (Signed)
 Patient called. She would like to know if she can take anything for her pain?

## 2023-09-29 ENCOUNTER — Ambulatory Visit (INDEPENDENT_AMBULATORY_CARE_PROVIDER_SITE_OTHER): Admitting: Family Medicine

## 2023-09-29 ENCOUNTER — Encounter: Payer: Self-pay | Admitting: Family Medicine

## 2023-09-29 ENCOUNTER — Ambulatory Visit: Payer: Self-pay | Admitting: Family Medicine

## 2023-09-29 VITALS — BP 122/78 | HR 90 | Temp 97.6°F | Ht 72.0 in | Wt 218.0 lb

## 2023-09-29 DIAGNOSIS — R7303 Prediabetes: Secondary | ICD-10-CM

## 2023-09-29 DIAGNOSIS — M25512 Pain in left shoulder: Secondary | ICD-10-CM | POA: Diagnosis not present

## 2023-09-29 DIAGNOSIS — G8929 Other chronic pain: Secondary | ICD-10-CM | POA: Diagnosis not present

## 2023-09-29 DIAGNOSIS — Z1159 Encounter for screening for other viral diseases: Secondary | ICD-10-CM | POA: Diagnosis not present

## 2023-09-29 DIAGNOSIS — Z01818 Encounter for other preprocedural examination: Secondary | ICD-10-CM

## 2023-09-29 DIAGNOSIS — Z0001 Encounter for general adult medical examination with abnormal findings: Secondary | ICD-10-CM | POA: Diagnosis not present

## 2023-09-29 LAB — HEMOGLOBIN A1C: Hgb A1c MFr Bld: 6.4 % (ref 4.6–6.5)

## 2023-09-29 LAB — TSH: TSH: 0.44 u[IU]/mL (ref 0.35–5.50)

## 2023-09-29 NOTE — Progress Notes (Signed)
 Complete physical exam  Patient: Mary Burton   DOB: Feb 10, 1960   63 y.o. Female  MRN: [id number]  Subjective:    Chief Complaint  Patient presents with   Annual Exam   She is here for a complete physical exam.   Discussed the use of AI scribe software for clinical note transcription with the patient, who gave verbal consent to proceed.  History of Present Illness Mary Burton is a 63 year old female who presents for an annual physical exam, preventive healthcare visit, and surgical clearance.  Preoperative evaluation for left shoulder arthroscopy and rotator cuff repair - Scheduled for left shoulder arthroscopy and rotator cuff tear repair on October 03, 2023 - Good shoulder movement with medication, though effect is diminishing  Articular and musculoskeletal symptoms - Gout currently affects her hand - Took Tylenol  this morning for pain relief  Glucose intolerance - Prediabetes - Last hemoglobin A1c test conducted in 2023 was 6.0%  Immunization status - Up to date on tetanus immunization - Has not received shingles or pneumonia vaccines  Vision and ophthalmologic status - No vision problems - Does not wear corrective lenses  Dental and gynecologic health maintenance - Regular dental visits - Current with OBGYN visits - No longer receives Pap smears  Constitutional and systemic symptoms - No mood issues, anxiety, or depression - No chest pain, palpitations, or shortness of breath - No dizziness, nausea, vomiting, constipation, or diarrhea - No urinary symptoms - No swelling in legs or feet - No other health concerns at this time      Health Maintenance  Topic Date Due   HIV Screening  Never done   Hepatitis C Screening  Never done   Pneumococcal Vaccine for age over 86 (1 of 1 - PCV) Never done   Mammogram  Never done   Zoster (Shingles) Vaccine (1 of 2) Never done   Pap with HPV screening  02/23/2014   Flu Shot  09/08/2023   COVID-19  Vaccine (4 - 2024-25 season) 10/15/2023*   Colon Cancer Screening  01/19/2025   DTaP/Tdap/Td vaccine (2 - Td or Tdap) 02/07/2025   Hepatitis B Vaccine  Aged Out   HPV Vaccine  Aged Out   Meningitis B Vaccine  Aged Out  *Topic was postponed. The date shown is not the original due date.    Wears seatbelt always, smoke detectors in home and functioning, does not text while driving, feels safe in home environment.  Depression screening:    09/29/2023    3:02 PM 08/25/2023    9:52 AM 10/31/2022    3:52 PM  Depression screen PHQ 2/9  Decreased Interest 0 0 0  Down, Depressed, Hopeless 0 0 0  PHQ - 2 Score 0 0 0   Anxiety Screening:     No data to display          Vision:Not within last year  and Dental: No current dental problems and Receives regular dental care  Patient Active Problem List   Diagnosis Date Noted   Right flank pain 08/25/2023   Pain in left shoulder 08/18/2022   Acute cystitis with hematuria 10/06/2021   Prediabetes 10/06/2021   Obesity (BMI 30.0-34.9) 10/06/2021   Impingement syndrome of right shoulder 07/02/2021   Bilateral foot pain 04/02/2014   Bilateral swelling of feet 06/06/2011   Seasonal allergies 06/06/2011   Bilateral knee pain 06/06/2011   Past Medical History:  Diagnosis Date   Allergy    Arthritis  Environmental allergies    Gout    r hand   Past Surgical History:  Procedure Laterality Date   COLONOSCOPY  10/12/2011   Pyrtle   OVARIAN CYST REMOVAL     POLYPECTOMY     TUBAL LIGATION  1985   Social History   Tobacco Use   Smoking status: Never   Smokeless tobacco: Never  Vaping Use   Vaping status: Never Used  Substance Use Topics   Alcohol use: No   Drug use: No      Patient Care Team: Lendia Boby CROME, NP-C as PCP - General (Family Medicine)   Outpatient Medications Prior to Visit  Medication Sig   ibuprofen (ADVIL) 200 MG tablet Take 400 mg by mouth every 8 (eight) hours as needed (pain).   Multiple Vitamin  (MULTIVITAMIN WITH MINERALS) TABS tablet Take 1 tablet by mouth in the morning.   No facility-administered medications prior to visit.    Review of Systems  Constitutional:  Negative for chills, fever, malaise/fatigue and weight loss.  HENT:  Negative for congestion, ear pain, sinus pain and sore throat.   Eyes:  Negative for blurred vision, double vision and pain.  Respiratory:  Negative for cough, shortness of breath and wheezing.   Cardiovascular:  Negative for chest pain, palpitations and leg swelling.  Gastrointestinal:  Negative for abdominal pain, constipation, diarrhea, nausea and vomiting.  Genitourinary:  Negative for dysuria, frequency and urgency.  Musculoskeletal:  Positive for joint pain. Negative for back pain and myalgias.  Skin:  Negative for rash.  Neurological:  Negative for dizziness, tingling, focal weakness and headaches.  Psychiatric/Behavioral:  Negative for depression. The patient is not nervous/anxious.        Objective:    BP 122/78   Pulse 90   Temp 97.6 F (36.4 C) (Temporal)   Ht 6' (1.829 m)   Wt 218 lb (98.9 kg)   LMP 05/22/2011   SpO2 98%   BMI 29.57 kg/m  BP Readings from Last 3 Encounters:  09/29/23 122/78  09/26/23 133/73  08/25/23 116/74   Wt Readings from Last 3 Encounters:  09/29/23 218 lb (98.9 kg)  09/26/23 220 lb 8 oz (100 kg)  08/25/23 218 lb 6 oz (99.1 kg)    Physical Exam Constitutional:      General: She is not in acute distress.    Appearance: She is not ill-appearing.  HENT:     Right Ear: Tympanic membrane, ear canal and external ear normal.     Left Ear: Tympanic membrane, ear canal and external ear normal.     Nose: Nose normal.     Mouth/Throat:     Mouth: Mucous membranes are moist.     Pharynx: Oropharynx is clear.  Eyes:     Extraocular Movements: Extraocular movements intact.     Conjunctiva/sclera: Conjunctivae normal.     Pupils: Pupils are equal, round, and reactive to light.  Neck:     Thyroid : No  thyroid  mass, thyromegaly or thyroid  tenderness.  Cardiovascular:     Rate and Rhythm: Normal rate and regular rhythm.     Pulses: Normal pulses.     Heart sounds: Normal heart sounds.  Pulmonary:     Effort: Pulmonary effort is normal.     Breath sounds: Normal breath sounds.  Abdominal:     General: Bowel sounds are normal.     Palpations: Abdomen is soft.     Tenderness: There is no abdominal tenderness. There is no right CVA tenderness,  left CVA tenderness, guarding or rebound.  Musculoskeletal:        General: Normal range of motion.     Cervical back: Normal range of motion and neck supple. No tenderness.     Right lower leg: No edema.     Left lower leg: No edema.  Lymphadenopathy:     Cervical: No cervical adenopathy.  Skin:    General: Skin is warm and dry.     Findings: No lesion or rash.  Neurological:     General: No focal deficit present.     Mental Status: She is alert and oriented to person, place, and time.     Cranial Nerves: No cranial nerve deficit.     Sensory: No sensory deficit.     Motor: No weakness.     Gait: Gait normal.  Psychiatric:        Mood and Affect: Mood normal.        Behavior: Behavior normal.        Thought Content: Thought content normal.      Results for orders placed or performed in visit on 09/29/23  TSH  Result Value Ref Range   TSH 0.44 0.35 - 5.50 uIU/mL  Hemoglobin A1c  Result Value Ref Range   Hgb A1c MFr Bld 6.4 4.6 - 6.5 %      Assessment & Plan:    Routine Health Maintenance and Physical Exam Problem List Items Addressed This Visit     Pain in left shoulder   Prediabetes   Relevant Orders   Hemoglobin A1c (Completed)   TSH (Completed)   Other Visit Diagnoses       Encounter for general adult medical examination with abnormal findings    -  Primary     Encounter for screening for other viral diseases       Relevant Orders   Hepatitis C antibody   HIV Antibody (routine testing w rflx)     Pre-op evaluation             Assessment and Plan Assessment & Plan Adult Wellness Visit Annual physical exam and preventive healthcare visit. No vision problems, wears glasses. Regular dental visits. Gynecological care at [facility], Pap smear status unclear. Up to date on tetanus immunization. No history of shingles or pneumonia vaccination. Mood is good, no anxiety or depression. No chest pain, palpitations, shortness of breath, dizziness, nausea, vomiting, constipation, or diarrhea. No swelling in legs or feet. - Check insurance for shingles vaccine coverage - Schedule nurse visit for shingles vaccine post-surgery - Order HIV and hepatitis C screening - Obtain records from [facility] regarding Pap smear status  Rotator cuff tear, left shoulder (planned surgery) Scheduled for left shoulder arthroscopy and rotator cuff tear repair on October 03, 2023, under general anesthesia.  - medical clearance, she is low risk. scheduled surgery on October 03, 2023  Prediabetes Prediabetes with last A1c in 2023.     Return in about 6 months (around 03/31/2024).     Boby Mackintosh, NP-C

## 2023-09-30 LAB — HIV ANTIBODY (ROUTINE TESTING W REFLEX): HIV 1&2 Ab, 4th Generation: NONREACTIVE

## 2023-09-30 LAB — HEPATITIS C ANTIBODY: Hepatitis C Ab: NONREACTIVE

## 2023-10-01 NOTE — Telephone Encounter (Signed)
 Would recommend just tylenol  leading up to surgery to limit bleeding

## 2023-10-02 ENCOUNTER — Encounter: Payer: Self-pay | Admitting: Family Medicine

## 2023-10-02 ENCOUNTER — Other Ambulatory Visit: Payer: Self-pay

## 2023-10-02 ENCOUNTER — Ambulatory Visit: Admitting: Family Medicine

## 2023-10-02 ENCOUNTER — Other Ambulatory Visit: Payer: Self-pay | Admitting: Surgical

## 2023-10-02 ENCOUNTER — Telehealth: Payer: Self-pay | Admitting: Surgical

## 2023-10-02 ENCOUNTER — Other Ambulatory Visit: Payer: Self-pay | Admitting: Family Medicine

## 2023-10-02 ENCOUNTER — Ambulatory Visit (INDEPENDENT_AMBULATORY_CARE_PROVIDER_SITE_OTHER)

## 2023-10-02 ENCOUNTER — Ambulatory Visit
Admission: RE | Admit: 2023-10-02 | Discharge: 2023-10-02 | Disposition: A | Discharge status: Home / Self Care | Source: Ambulatory Visit | Attending: Orthopedic Surgery | Admitting: Orthopedic Surgery

## 2023-10-02 ENCOUNTER — Ambulatory Visit: Payer: Self-pay

## 2023-10-02 ENCOUNTER — Ambulatory Visit: Payer: Self-pay | Admitting: Family Medicine

## 2023-10-02 VITALS — BP 138/70 | HR 82 | Temp 98.6°F | Resp 20 | Ht 72.0 in | Wt 218.0 lb

## 2023-10-02 DIAGNOSIS — Z043 Encounter for examination and observation following other accident: Secondary | ICD-10-CM | POA: Diagnosis not present

## 2023-10-02 DIAGNOSIS — M79604 Pain in right leg: Secondary | ICD-10-CM

## 2023-10-02 DIAGNOSIS — M7541 Impingement syndrome of right shoulder: Secondary | ICD-10-CM

## 2023-10-02 DIAGNOSIS — W19XXXA Unspecified fall, initial encounter: Secondary | ICD-10-CM | POA: Diagnosis not present

## 2023-10-02 DIAGNOSIS — M25551 Pain in right hip: Secondary | ICD-10-CM

## 2023-10-02 DIAGNOSIS — M51369 Other intervertebral disc degeneration, lumbar region without mention of lumbar back pain or lower extremity pain: Secondary | ICD-10-CM | POA: Diagnosis not present

## 2023-10-02 DIAGNOSIS — M25561 Pain in right knee: Secondary | ICD-10-CM | POA: Diagnosis not present

## 2023-10-02 MED ORDER — OXYCODONE-ACETAMINOPHEN 5-325 MG PO TABS
1.0000 | ORAL_TABLET | Freq: Four times a day (QID) | ORAL | 0 refills | Status: DC | PRN
  Filled 2023-10-02: qty 24, 6d supply, fill #0

## 2023-10-02 NOTE — Telephone Encounter (Signed)
 Called patient to discuss MRI results.  Did not get a hold of her.  Called her brother Mary Burton who gave me the number for her other brother Mary Burton who is staying with her.  Called that number and it went straight to voicemail.  I called Mary Burton again and left a voicemail message detailing that she has nondisplaced sacral fracture and muscular injury to the right hip and nothing looks operative but it will take probably 3 to 4 weeks until she can get around without assistance.  Discussed over the voicemail message that surgery is not ideal in this timeframe since she will not be able to weight-bear through her operative shoulder afterwards.  Ultimately it is up to her if she feels that she wants to proceed with surgery, this is an option.  She would likely require a hemiwalker to use in the right hand or wheelchair.  Since she did not pick up, we will see her in preop and we can further discuss timeframe and options.  If she decides to hold off on surgery, we will plan to let this fracture heal over the next 6 weeks and repost her for surgery around that time.

## 2023-10-02 NOTE — Telephone Encounter (Signed)
 FYI Only or Action Required?: Action required by provider: request for appointment.  Patient was last seen in primary care on 09/29/2023 by Lendia Boby CROME, NP-C.  Called Nurse Triage reporting Fall.  Symptoms began yesterday.  Interventions attempted: OTC medications: tylenol  .  Symptoms are: gradually worsening.  Triage Disposition: See HCP Within 4 Hours (Or PCP Triage)  Patient/caregiver understands and will follow disposition?: YesCopied from CRM #[id number]. Topic: Clinical - Red Word Triage >> Oct 02, 2023  7:43 AM Rosaria BRAVO wrote: Red Word that prompted transfer to Nurse Triage: Pt fell, believes she has fractured a bone in her leg. Cannot walk. Reason for Disposition  [1] MODERATE weakness (e.g., interferes with work, school, normal activities) AND [2] new-onset or getting worse  Answer Assessment - Initial Assessment Questions Pt took tylenol  for pain and no relief. Pt stated I can't walk on right leg. It's not swollen or bruised. Pt fell on bottom but states right leg is sore now. Pt is suppose to have rotator cuff surgery tomorrow and can only take tylenol      1. MECHANISM: How did the fall happen?     Missed chair while sitting 2. DOMESTIC VIOLENCE AND ELDER ABUSE SCREENING: Did you fall because someone pushed you or tried to hurt you? If Yes, ask: Are you safe now?     denies 3. ONSET: When did the fall happen? (e.g., minutes, hours, or days ago)     Yesterday  4. LOCATION: What part of the body hit the ground? (e.g., back, buttocks, head, hips, knees, hands, head, stomach)     Right leg- upper thigh 5. INJURY: Did you hurt (injure) yourself when you fell? If Yes, ask: What did you injure? Tell me more about this? (e.g., body area; type of injury; pain severity)     tingle' 6. PAIN: Is there any pain? If Yes, ask: How bad is the pain? (e.g., Scale 0-10; or none, mild,      Can't walk 7. SIZE: For cuts, bruises, or swelling, ask: How large is  it? (e.g., inches or centimeters)      denies 8. PREGNANCY: Is there any chance you are pregnant? When was your last menstrual period?     na 9. OTHER SYMPTOMS: Do you have any other symptoms? (e.g., dizziness, fever, weakness; new-onset or worsening).      Non-weight bearing 10. CAUSE: What do you think caused the fall (or falling)? (e.g., dizzy spell, tripped)       Missed chair  Protocols used: Falls and [facility]

## 2023-10-02 NOTE — Telephone Encounter (Signed)
 Patient has called in, spoken with Herlene, had a fall, and now has STAT scan ordered. Pending results of scan.

## 2023-10-02 NOTE — Progress Notes (Signed)
 Assessment & Plan Right leg pain Discussed options for obtaining x-rays downstairs without pain medication vs going to the ER so that she can have pain medication prior to imaging due to the amount of pain she is in. She is unable to take NSAIDs prior to surgery tomorrow and does not want her surgery cancelled even with the current pain in her leg. Continue Tylenol  for pain relief. Discussed if there are no fractures she will need to be referred to physical therapy.  Orders:   DG HIP UNILAT WITH PELVIS 2-3 VIEWS RIGHT; Future  Fall, initial encounter Missed chair and landed on her buttocks.  Orders:   DG HIP UNILAT WITH PELVIS 2-3 VIEWS RIGHT; Future  Impingement syndrome of right shoulder Discussed the possibility that her shoulder surgery may get postponed since she is currently unable to bear weight and it is elective.      Follow up plan: Return if symptoms worsen or fail to improve.  Niki Rung, MSN, APRN, FNP-C  Subjective:  HPI: Mary Burton is a 63 y.o. female presenting on 10/02/2023 for Fall (When to hair appt yesterday - went to sit in hair chair and fell to the floor/Right upper thigh pain //Note - scheduled for rotator cuff surgery tomorrow )  Discussed the use of AI scribe software for clinical note transcription with the patient, who gave verbal consent to proceed.  History of Present Illness   Mary Burton is a 63 year old female who presents with right leg pain and inability to bear weight following a fall. Patient is accompanied by her niece, who she is okay with being present.  She experienced a fall at home while attempting to sit down, missing the chair and landing on her buttocks. Since the fall, she has been unable to bear weight on her right leg and cannot stand or walk without assistance. She has severe pain in her right thigh and is unable to lift her leg. She uses a walker but is unable to put pressure on her right leg, relying on her left leg  for mobility and dragging the right leg.  She has taken Tylenol  for pain relief, which provided minimal relief. She is scheduled for shoulder surgery tomorrow and has been avoiding NSAIDs to limit bleeding risk. She previously received a cortisone shot for a torn shoulder cuff and was instructed to use Tylenol  for pain management leading up to the surgery. She has a prescription for Tramadol  but states she does not take it because it makes her sick.  Denies low back or buttock pain.        ROS: Negative unless specifically indicated above in HPI.   Relevant past medical history reviewed and updated as indicated.   Allergies and medications reviewed and updated.   Current Outpatient Medications:    acetaminophen  (TYLENOL ) 325 MG tablet, Take 650 mg by mouth every 6 (six) hours as needed for moderate pain (pain score 4-6)., Disp: , Rfl:    Multiple Vitamin (MULTIVITAMIN WITH MINERALS) TABS tablet, Take 1 tablet by mouth in the morning., Disp: , Rfl:   No Known Allergies  Objective:   BP 138/70   Pulse 82   Temp 98.6 F (37 C)   Resp 20   Ht 6' (1.829 m)   Wt 218 lb (98.9 kg)   LMP 05/22/2011   BMI 29.57 kg/m    Physical Exam Vitals reviewed.  Constitutional:      General: She is not in acute distress.  Appearance: Normal appearance. She is not ill-appearing, toxic-appearing or diaphoretic.  HENT:     Head: Normocephalic and atraumatic.  Eyes:     General: No scleral icterus.       Right eye: No discharge.        Left eye: No discharge.     Conjunctiva/sclera: Conjunctivae normal.  Cardiovascular:     Rate and Rhythm: Normal rate.  Pulmonary:     Effort: Pulmonary effort is normal. No respiratory distress.  Musculoskeletal:        General: Normal range of motion.     Cervical back: Normal range of motion.     Comments: Significant pain in right leg. Patient in extreme pain when I attempted to perform passive ROM.  Skin:    General: Skin is warm and dry.      Capillary Refill: Capillary refill takes less than 2 seconds.  Neurological:     General: No focal deficit present.     Mental Status: She is alert and oriented to person, place, and time. Mental status is at baseline.     Gait: Gait abnormal (riding in wheelchair).  Psychiatric:        Mood and Affect: Mood normal.        Behavior: Behavior normal.        Thought Content: Thought content normal.        Judgment: Judgment normal.

## 2023-10-02 NOTE — Telephone Encounter (Signed)
 Patient called and said that she wants to know did you look at the MRI to see if its broken? If not she is asking for something for pain. CB#[phone]

## 2023-10-02 NOTE — Progress Notes (Signed)
 Patient called triage line stating she was scheduled for surgery tomorrow with Dr Addie but had taken a fall and now was having severe right hip pain and unable to WTB without a walker.  She is scheduled for surgery on her shoulder tomorrow. Patient had hip xray done outside.  Herlene and Dr Addie reviewed. Was instructed to place an urgent MRI order of her hip to rule out fracture.

## 2023-10-02 NOTE — Assessment & Plan Note (Signed)
 Discussed the possibility that her shoulder surgery may get postponed since she is currently unable to bear weight and it is elective.

## 2023-10-03 ENCOUNTER — Ambulatory Visit (HOSPITAL_COMMUNITY): Admission: RE | Admit: 2023-10-03 | Source: Ambulatory Visit | Admitting: Orthopedic Surgery

## 2023-10-03 ENCOUNTER — Encounter (HOSPITAL_COMMUNITY): Admission: RE | Payer: Self-pay | Source: Ambulatory Visit

## 2023-10-03 ENCOUNTER — Other Ambulatory Visit (HOSPITAL_COMMUNITY): Payer: Self-pay

## 2023-10-03 DIAGNOSIS — Z01818 Encounter for other preprocedural examination: Secondary | ICD-10-CM

## 2023-10-03 SURGERY — ARTHROSCOPY, SHOULDER WITH DEBRIDEMENT
Anesthesia: General | Site: Shoulder | Laterality: Left

## 2023-10-03 NOTE — Telephone Encounter (Signed)
 I called and spoke with Mary Burton last night.  We discussed her MRI findings including sacral fracture and small hip effusion.  No operative fracture or pathology based on her scan.  However it is going to take several weeks for this fracture to heal to the point where she is more mobile based on her significant limited mobility yesterday.  After discussion of options, she elected to cancel her rotator cuff surgery and we will plan to allow her to slowly mobilize with walker and give time for this fracture to heal.  I did send in prescription for Percocet to help with postfracture pain.  Can we get her appointment to come back in 7 to 10 days for radiographs of the sacrum and evaluation of how her mobility is doing?  Debbie, she should be good to add back into the schedule 6 weeks from now roughly.

## 2023-10-03 NOTE — Telephone Encounter (Signed)
 Called patient  - aware of xray results   Had MRI yesterday (ordered by Dr Herlene) showed fractured hip that will take 6-8 weeks to heal. Her ortho surgeon cancelled shoulder surgery for 8/26. She states that she will follow him from this point out and he will order the PT in the future.

## 2023-10-04 ENCOUNTER — Telehealth: Payer: Self-pay | Admitting: Surgical

## 2023-10-04 NOTE — Telephone Encounter (Signed)
 Scheduled with Ronal Dragon per Dr Addie

## 2023-10-04 NOTE — Telephone Encounter (Signed)
 Patient called and said the medicine isn't working and the gout is so bad in her hands that she can't walk. She said she can't even make a fist. CB#[phone]

## 2023-10-04 NOTE — Telephone Encounter (Signed)
 Can we have her see Dr Addie or Derry to eval her hands this week? Had recent treatment for presumptive gout, would be good to see if that helped initially and may need allopurinol long-term at this point.  If it didn't help her symptoms at all from last visit, then may need MRI of that hand

## 2023-10-05 ENCOUNTER — Ambulatory Visit (INDEPENDENT_AMBULATORY_CARE_PROVIDER_SITE_OTHER): Admitting: Physician Assistant

## 2023-10-05 ENCOUNTER — Encounter (HOSPITAL_BASED_OUTPATIENT_CLINIC_OR_DEPARTMENT_OTHER): Payer: Self-pay | Admitting: Physician Assistant

## 2023-10-05 ENCOUNTER — Other Ambulatory Visit (HOSPITAL_COMMUNITY): Payer: Self-pay

## 2023-10-05 DIAGNOSIS — M25511 Pain in right shoulder: Secondary | ICD-10-CM | POA: Insufficient documentation

## 2023-10-05 DIAGNOSIS — M79642 Pain in left hand: Secondary | ICD-10-CM

## 2023-10-05 DIAGNOSIS — M79641 Pain in right hand: Secondary | ICD-10-CM | POA: Diagnosis not present

## 2023-10-05 DIAGNOSIS — M1009 Idiopathic gout, multiple sites: Secondary | ICD-10-CM | POA: Insufficient documentation

## 2023-10-05 MED ORDER — METHYLPREDNISOLONE ACETATE 40 MG/ML IJ SUSP
40.0000 mg | INTRAMUSCULAR | Status: AC | PRN
  Administered 2023-10-05: 40 mg via INTRA_ARTICULAR

## 2023-10-05 MED ORDER — COLCHICINE 0.6 MG PO TABS
0.6000 mg | ORAL_TABLET | Freq: Two times a day (BID) | ORAL | 12 refills | Status: DC
  Filled 2023-10-05: qty 10, 5d supply, fill #0

## 2023-10-05 MED ORDER — TRIAMCINOLONE ACETONIDE 40 MG/ML IJ SUSP
2.0000 mg | INTRAMUSCULAR | Status: AC | PRN
Start: 2023-10-05 — End: 2023-10-05
  Administered 2023-10-05: 2 mg via INTRA_ARTICULAR

## 2023-10-05 MED ORDER — LIDOCAINE HCL 1 % IJ SOLN
4.0000 mL | INTRAMUSCULAR | Status: AC | PRN
  Administered 2023-10-05: 4 mL

## 2023-10-05 NOTE — Progress Notes (Signed)
 Office Visit Note   Patient: Mary Burton           Date of Birth: Aug 21, 1960           MRN: [id number] Visit Date: 10/05/2023              Requested by: Lendia Boby CROME, NP-C [address] [place],  [place] [address] PCP: Lendia Boby CROME, NP-C  Chief Complaint  Patient presents with   Right Hand - Pain   Left Hand - Pain      HPI: Mary Burton is a pleasant 63 year old woman who have seen in the past and most recently has been followed by [name] and Herlene.  She was scheduled for surgery on her left shoulder but unfortunately fell onto her buttocks with a sacral fracture.  She is using a walker.  She actually gets referred today because she has been having pain and swelling in both of her hands also has significant pain in her right shoulder she has had injections in the past into the right shoulder.  She did say that Herlene did do ultrasounds and thought she had findings on the ultrasound consistent with gout in her hands.  She was placed on colchicine  and prednisone  a couple weeks ago she said that really helped.  Not currently taking anything.  Assessment & Plan: Visit Diagnoses:  1. Bilateral hand pain   2. Right shoulder pain, unspecified chronicity   3. Acute idiopathic gout of multiple sites     Plan: With regards to her hands she does have a history of gout and she did have swelling and pain in her hands.  Will place her back on colchicine  for a few days.  If she gets relief would then advocate long-term allopurinol which she can follow-up with her primary care physician.  Will also draw uric acid today.  With regards to her right shoulder she has had a history of impingement in the right shoulder in the past and done well with injections so we will go forward with an injection today we will follow-up with [name] next week  Follow-Up Instructions: No follow-ups on file.   Ortho Exam  Patient is alert, oriented, no adenopathy, well-dressed, normal affect, normal  respiratory effort. Bilateral hands she has mild soft tissue swelling no redness no erythema she is neurovascular intact she has pain with movement.  Right shoulder she has impingement findings is very pent tender.  Neurovascular intact has pain with forward elevation internal rotation behind her back    Imaging: No results found. No images are attached to the encounter.  Labs: Lab Results  Component Value Date   HGBA1C 6.4 09/29/2023   HGBA1C 6.0 10/06/2021   HGBA1C 5.8 02/28/2013   LABURIC 4.0 12/12/2022   LABURIC 4.3 04/02/2014     Lab Results  Component Value Date   ALBUMIN 4.0 10/06/2021   ALBUMIN 3.9 04/02/2014   ALBUMIN 4.0 12/11/2012    No results found for: MG No results found for: VD25OH  No results found for: PREALBUMIN    Latest Ref Rng & Units 09/26/2023    9:30 AM 10/06/2021    3:23 PM 04/02/2014    4:10 PM  CBC EXTENDED  WBC 4.0 - 10.5 K/uL 5.2  5.1  4.7   RBC 3.87 - 5.11 MIL/uL 4.24  4.22  4.21   Hemoglobin 12.0 - 15.0 g/dL 87.8  87.9  88.0   HCT 36.0 - 46.0 % 38.0  36.9  37.4  Platelets 150 - 400 K/uL 285  201.0  237   NEUT# 1.4 - 7.7 K/uL  2.9  2.7   Lymph# 0.7 - 4.0 K/uL  1.6  1.5      There is no height or weight on file to calculate BMI.  Orders:  Orders Placed This Encounter  Procedures   Uric acid   Meds ordered this encounter  Medications   colchicine  0.6 MG tablet    Sig: Take 1 tablet (0.6 mg total) by mouth 2 (two) times daily.    Dispense:  10 tablet    Refill:  12     Procedures: Large Joint Inj: R subacromial bursa on 10/05/2023 10:22 AM Indications: diagnostic evaluation and pain Details: 1.5 in posterior approach  Arthrogram: No  Medications: 4 mL lidocaine  1 %; 40 mg methylPREDNISolone  acetate 40 MG/ML; 2 mg triamcinolone  acetonide 40 MG/ML Outcome: tolerated well, no immediate complications Procedure, treatment alternatives, risks and benefits explained, specific risks discussed. Consent was given by the  patient.      Clinical Data: No additional findings.  ROS:  All other systems negative, except as noted in the HPI. Review of Systems  Objective: Vital Signs: LMP 05/22/2011   Specialty Comments:  No specialty comments available.  PMFS History: Patient Active Problem List   Diagnosis Date Noted   Pain in right shoulder 10/05/2023   Idiopathic gout of multiple sites 10/05/2023   Right flank pain 08/25/2023   Pain in left shoulder 08/18/2022   Acute cystitis with hematuria 10/06/2021   Prediabetes 10/06/2021   Obesity (BMI 30.0-34.9) 10/06/2021   Impingement syndrome of right shoulder 07/02/2021   Bilateral foot pain 04/02/2014   Bilateral swelling of feet 06/06/2011   Seasonal allergies 06/06/2011   Bilateral knee pain 06/06/2011   Past Medical History:  Diagnosis Date   Allergy    Arthritis    Environmental allergies    Gout    r hand    Family History  Problem Relation Age of Onset   Colon cancer Mother        unsure age   Hypertension Father    Early death Sister    High blood pressure Sister    Colon polyps Neg Hx    Esophageal cancer Neg Hx    Rectal cancer Neg Hx    Stomach cancer Neg Hx     Past Surgical History:  Procedure Laterality Date   COLONOSCOPY  10/12/2011   Pyrtle   OVARIAN CYST REMOVAL     POLYPECTOMY     TUBAL LIGATION  1985   Social History   Occupational History   Not on file  Tobacco Use   Smoking status: Never   Smokeless tobacco: Never  Vaping Use   Vaping status: Never Used  Substance and Sexual Activity   Alcohol use: No   Drug use: No   Sexual activity: Yes    Birth control/protection: None

## 2023-10-11 ENCOUNTER — Other Ambulatory Visit (HOSPITAL_COMMUNITY): Payer: Self-pay

## 2023-10-11 ENCOUNTER — Ambulatory Visit: Admitting: Orthopedic Surgery

## 2023-10-11 ENCOUNTER — Other Ambulatory Visit (INDEPENDENT_AMBULATORY_CARE_PROVIDER_SITE_OTHER): Payer: Self-pay

## 2023-10-11 ENCOUNTER — Other Ambulatory Visit: Payer: Self-pay

## 2023-10-11 ENCOUNTER — Telehealth: Payer: Self-pay

## 2023-10-11 DIAGNOSIS — M25421 Effusion, right elbow: Secondary | ICD-10-CM

## 2023-10-11 DIAGNOSIS — M25551 Pain in right hip: Secondary | ICD-10-CM

## 2023-10-11 MED ORDER — PREDNISONE 5 MG (21) PO TBPK
ORAL_TABLET | ORAL | 0 refills | Status: DC
  Filled 2023-10-11: qty 21, 6d supply, fill #0

## 2023-10-11 NOTE — Telephone Encounter (Signed)
 Patient seen today and Dr Addie wants patient to be reposted for right shoulder surgery

## 2023-10-12 ENCOUNTER — Other Ambulatory Visit (HOSPITAL_COMMUNITY): Payer: Self-pay

## 2023-10-14 ENCOUNTER — Encounter: Payer: Self-pay | Admitting: Orthopedic Surgery

## 2023-10-14 NOTE — Progress Notes (Unsigned)
 Office Visit Note   Patient: Mary Burton           Date of Birth: Nov 15, 1960           MRN: [id number] Visit Date: 10/11/2023 Requested by: Lendia Boby CROME, NP-C [address] [place],  [place] [address] PCP: Lendia Boby CROME, NP-C  Subjective: Chief Complaint  Patient presents with   Other    Follow up sacral fracture    HPI: Mary Burton is a 63 y.o. female who presents to the office reporting for follow-up of sacral fracture.  She has been weightbearing with cane.  She also is having a lot of pain in the right elbow which is acute onset.  Also having some left knee pain and warmth.  She believes she may have gout flare.  She does not take allopurinol.              ROS: All systems reviewed are negative as they relate to the chief complaint within the history of present illness.  Patient denies fevers or chills.  Assessment & Plan: Visit Diagnoses:  1. Pain of right hip     Plan: Impression is healing sacral fracture with no evidence on radiographs of any type of displacement.  I think she is having a gout flare definitely in her elbow with warmth and decreased range of motion.  Injection performed in the elbow today under ultrasound guidance.  Medrol  Dosepak also for 6 days ordered.  Will reposterior for right shoulder surgery.  It is again stressed to [facility] today how painful the right shoulder surgery will be.  She will have to really focus on rehab in order to avoid getting a stiff shoulder.  Uric acid level on drawl from [facility] was normal.  Follow-Up Instructions: No follow-ups on file.   Orders:  Orders Placed This Encounter  Procedures   XR Pelvis 1-2 Views   US  Guided Needle Placement - No Linked Charges   Meds ordered this encounter  Medications   predniSONE  (STERAPRED UNI-PAK 21 TAB) 5 MG (21) TBPK tablet    Sig: Take dosepak as directed    Dispense:  21 tablet    Refill:  0      Procedures: Medium Joint Inj: R elbow on 10/11/2023 10:30  PM Details: 22 G 1.5 in needle, ultrasound-guided posterolateral approach Medications: 3 mL lidocaine  1 %; 20 mg triamcinolone  acetonide 40 MG/ML      Clinical Data: No additional findings.  Objective: Vital Signs: LMP 05/22/2011   Physical Exam:  Constitutional: Patient appears well-developed HEENT:  Head: Normocephalic Eyes:EOM are normal Neck: Normal range of motion Cardiovascular: Normal rate Pulmonary/chest: Effort normal Neurologic: Patient is alert Skin: Skin is warm Psychiatric: Patient has normal mood and affect  Ortho Exam: Ortho exam demonstrates tenderness and warmth around that right elbow with an effusion.  Biceps triceps function intact.  Motor or sensory function of the hand is intact.  Pronation supination mildly painful on the right.  No proximal lymphadenopathy.  Also having little left knee pain but there is no warmth or effusion in the left knee.  Range of motion intact.  Specialty Comments:  No specialty comments available.  Imaging: No results found.   PMFS History: Patient Active Problem List   Diagnosis Date Noted   Pain in right shoulder 10/05/2023   Idiopathic gout of multiple sites 10/05/2023   Right flank pain 08/25/2023   Pain in left shoulder 08/18/2022   Acute cystitis with hematuria 10/06/2021  Prediabetes 10/06/2021   Obesity (BMI 30.0-34.9) 10/06/2021   Impingement syndrome of right shoulder 07/02/2021   Bilateral foot pain 04/02/2014   Bilateral swelling of feet 06/06/2011   Seasonal allergies 06/06/2011   Bilateral knee pain 06/06/2011   Past Medical History:  Diagnosis Date   Allergy    Arthritis    Environmental allergies    Gout    r hand    Family History  Problem Relation Age of Onset   Colon cancer Mother        unsure age   Hypertension Father    Early death Sister    High blood pressure Sister    Colon polyps Neg Hx    Esophageal cancer Neg Hx    Rectal cancer Neg Hx    Stomach cancer Neg Hx     Past  Surgical History:  Procedure Laterality Date   COLONOSCOPY  10/12/2011   Pyrtle   OVARIAN CYST REMOVAL     POLYPECTOMY     TUBAL LIGATION  1985   Social History   Occupational History   Not on file  Tobacco Use   Smoking status: Never   Smokeless tobacco: Never  Vaping Use   Vaping status: Never Used  Substance and Sexual Activity   Alcohol use: No   Drug use: No   Sexual activity: Yes    Birth control/protection: None

## 2023-10-15 MED ORDER — LIDOCAINE HCL 1 % IJ SOLN
3.0000 mL | INTRAMUSCULAR | Status: AC | PRN
  Administered 2023-10-11: 3 mL

## 2023-10-15 MED ORDER — TRIAMCINOLONE ACETONIDE 40 MG/ML IJ SUSP
20.0000 mg | INTRAMUSCULAR | Status: AC | PRN
  Administered 2023-10-11: 20 mg via INTRA_ARTICULAR

## 2023-10-25 ENCOUNTER — Telehealth: Payer: Self-pay | Admitting: Orthopedic Surgery

## 2023-10-25 NOTE — Telephone Encounter (Signed)
 Received call from patient , FMLA ends 10/06, she states she not able to return to work due to severe shoulder pain. Is it okay to extend leave while she waits for her shoulder surgery to get rescheduled? Thank you  Marval, please call patient to reschedule her shoulder surgery. She understands the whole process has to start over and wants to get things going. [phone]

## 2023-10-26 NOTE — Telephone Encounter (Signed)
 [employer] form for sacral fx updated and faxed.

## 2023-10-26 NOTE — Telephone Encounter (Signed)
 Ok to extend fmla for 3 more months thx

## 2023-10-30 ENCOUNTER — Other Ambulatory Visit: Payer: Self-pay | Admitting: Orthopedic Surgery

## 2023-10-30 ENCOUNTER — Telehealth: Payer: Self-pay

## 2023-10-30 MED ORDER — MELOXICAM 15 MG PO TABS
15.0000 mg | ORAL_TABLET | Freq: Every day | ORAL | 0 refills | Status: DC | PRN
  Filled 2023-10-30: qty 30, 30d supply, fill #0

## 2023-10-30 NOTE — Telephone Encounter (Signed)
 Mobic sent.

## 2023-10-30 NOTE — Telephone Encounter (Signed)
 Patient is having a lot of pain and she is asking for something for inflammation. She has taken Naproxen  in the past. The injection has not helping at all.  PATIENT USES [facility]

## 2023-10-31 ENCOUNTER — Other Ambulatory Visit: Payer: Self-pay

## 2023-10-31 ENCOUNTER — Other Ambulatory Visit (HOSPITAL_COMMUNITY): Payer: Self-pay

## 2023-10-31 NOTE — Telephone Encounter (Signed)
 Called and advised pt.

## 2023-11-07 ENCOUNTER — Encounter (HOSPITAL_COMMUNITY): Payer: Self-pay

## 2023-11-07 NOTE — Pre-Procedure Instructions (Signed)
 Surgical Instructions   Your procedure is scheduled on November 14, 2023. Report to [facility] Main Entrance A at 9:30 A.M., then check in with the Admitting office. Any questions or running late day of surgery: call [phone]  Questions prior to your surgery date: call [phone], Monday-Friday, 8am-4pm. If you experience any cold or flu symptoms such as cough, fever, chills, shortness of breath, etc. between now and your scheduled surgery, please notify us  at the above number.     Remember:  Do not eat after midnight the night before your surgery   You may drink clear liquids until 8:30 AM the morning of your surgery.   Clear liquids allowed are: Water, Non-Citrus Juices (without pulp), Carbonated Beverages, Clear Tea (no milk, honey, etc.), Black Coffee Only (NO MILK, CREAM OR POWDERED CREAMER of any kind), and Gatorade.  Patient Instructions  The night before surgery:  No food after midnight. ONLY clear liquids after midnight  The day of surgery (if you do NOT have diabetes):  Drink ONE (1) Pre-Surgery Clear Ensure by 8:30 AM the morning of surgery. Drink in one sitting. Do not sip.  This drink was given to you during your hospital  pre-op appointment visit.  Nothing else to drink after completing the  Pre-Surgery Clear Ensure.         If you have questions, please contact your surgeon's office.    Take these medicines the morning of surgery with A SIP OF WATER: NONE   One week prior to surgery, STOP taking any Aspirin (unless otherwise instructed by your surgeon) Aleve , Naproxen , Ibuprofen, Motrin, Advil, Goody's, BC's, all herbal medications, fish oil, and non-prescription vitamins.                     Do NOT Smoke (Tobacco/Vaping) for 24 hours prior to your procedure.  If you use a CPAP at night, you may bring your mask/headgear for your overnight stay.   You will be asked to remove any contacts, glasses, piercing's, hearing aid's, dentures/partials prior to  surgery. Please bring cases for these items if needed.    Patients discharged the day of surgery will not be allowed to drive home, and someone needs to stay with them for 24 hours.  SURGICAL WAITING ROOM VISITATION Patients may have no more than 2 support people in the waiting area - these visitors may rotate.   Pre-op nurse will coordinate an appropriate time for 1 ADULT support person, who may not rotate, to accompany patient in pre-op.  Children under the age of 75 must have an adult with them who is not the patient and must remain in the main waiting area with an adult.  If the patient needs to stay at the hospital during part of their recovery, the visitor guidelines for inpatient rooms apply.  Please refer to the [facility] website for the visitor guidelines for any additional information.   If you received a COVID test during your pre-op visit  it is requested that you wear a mask when out in public, stay away from anyone that may not be feeling well and notify your surgeon if you develop symptoms. If you have been in contact with anyone that has tested positive in the last 10 days please notify you surgeon.      Pre-operative CHG Bathing Instructions   You can play a key role in reducing the risk of infection after surgery. Your skin needs to be as free of germs as possible. You  can reduce the number of germs on your skin by washing with CHG (chlorhexidine  gluconate) soap before surgery. CHG is an antiseptic soap that kills germs and continues to kill germs even after washing.   DO NOT use if you have an allergy to chlorhexidine /CHG or antibacterial soaps. If your skin becomes reddened or irritated, stop using the CHG and notify one of our RNs at [phone].              TAKE A SHOWER THE NIGHT BEFORE SURGERY AND THE DAY OF SURGERY    Please keep in mind the following:  DO NOT shave, including legs and underarms, 48 hours prior to surgery.   You may shave your face before/day  of surgery.  Place clean sheets on your bed the night before surgery Use a clean washcloth (not used since being washed) for each shower. DO NOT sleep with pet's night before surgery.  CHG Shower Instructions:  Wash your face and private area with normal soap. If you choose to wash your hair, wash first with your normal shampoo.  After you use shampoo/soap, rinse your hair and body thoroughly to remove shampoo/soap residue.  Turn the water OFF and apply half the bottle of CHG soap to a CLEAN washcloth.  Apply CHG soap ONLY FROM YOUR NECK DOWN TO YOUR TOES (washing for 3-5 minutes)  DO NOT use CHG soap on face, private areas, open wounds, or sores.  Pay special attention to the area where your surgery is being performed.  If you are having back surgery, having someone wash your back for you may be helpful. Wait 2 minutes after CHG soap is applied, then you may rinse off the CHG soap.  Pat dry with a clean towel  Put on clean pajamas    Additional instructions for the day of surgery: DO NOT APPLY any lotions, deodorants, cologne, or perfumes.   Do not wear jewelry or makeup Do not wear nail polish, gel polish, artificial nails, or any other type of covering on natural nails (fingers and toes) Do not bring valuables to the hospital. [facility] is not responsible for valuables/personal belongings. Put on clean/comfortable clothes.  Please brush your teeth.  Ask your nurse before applying any prescription medications to the skin.

## 2023-11-08 ENCOUNTER — Encounter (HOSPITAL_COMMUNITY): Payer: Self-pay

## 2023-11-08 ENCOUNTER — Other Ambulatory Visit: Payer: Self-pay

## 2023-11-08 ENCOUNTER — Encounter (HOSPITAL_COMMUNITY)
Admission: RE | Admit: 2023-11-08 | Discharge: 2023-11-08 | Disposition: A | Discharge status: Home / Self Care | Source: Ambulatory Visit | Attending: Orthopedic Surgery | Admitting: Orthopedic Surgery

## 2023-11-08 VITALS — BP 129/78 | HR 104 | Temp 97.9°F | Resp 16 | Ht 72.0 in | Wt 208.4 lb

## 2023-11-08 DIAGNOSIS — Z01812 Encounter for preprocedural laboratory examination: Secondary | ICD-10-CM | POA: Insufficient documentation

## 2023-11-08 DIAGNOSIS — Z01818 Encounter for other preprocedural examination: Secondary | ICD-10-CM

## 2023-11-08 HISTORY — DX: Prediabetes: R73.03

## 2023-11-08 LAB — CBC
HCT: 36.1 % (ref 36.0–46.0)
Hemoglobin: 11.7 g/dL — ABNORMAL LOW (ref 12.0–15.0)
MCH: 28.2 pg (ref 26.0–34.0)
MCHC: 32.4 g/dL (ref 30.0–36.0)
MCV: 87 fL (ref 80.0–100.0)
Platelets: 357 K/uL (ref 150–400)
RBC: 4.15 MIL/uL (ref 3.87–5.11)
RDW: 13.5 % (ref 11.5–15.5)
WBC: 7.1 K/uL (ref 4.0–10.5)
nRBC: 0 % (ref 0.0–0.2)

## 2023-11-08 LAB — BASIC METABOLIC PANEL WITH GFR
Anion gap: 12 (ref 5–15)
BUN: 11 mg/dL (ref 8–23)
CO2: 25 mmol/L (ref 22–32)
Calcium: 9 mg/dL (ref 8.9–10.3)
Chloride: 101 mmol/L (ref 98–111)
Creatinine, Ser: 0.49 mg/dL (ref 0.44–1.00)
GFR, Estimated: >60 mL/min (ref >60)
Glucose, Bld: 101 mg/dL — ABNORMAL HIGH (ref 70–99)
Potassium: 3.7 mmol/L (ref 3.5–5.1)
Sodium: 138 mmol/L (ref 135–145)

## 2023-11-08 NOTE — Progress Notes (Signed)
 PCP - [facility] Cardiologist -   PPM/ICD - denies Device Orders -  Rep Notified -   Chest x-ray - na EKG - na Stress Test - denies ECHO - denies Cardiac Cath - denies  Sleep Study - denies CPAP - no  Fasting Blood Sugar - na- prediabetes Checks Blood Sugar _____ times a day  Last dose of GLP1 agonist-  na GLP1 instructions:   Blood Thinner Instructions:na Aspirin Instructions:na  ERAS Protcol - clear liquids until 0830  PRE-SURGERY Ensure or G2- Ensure  COVID TEST- na   Anesthesia review: no  Patient denies shortness of breath, fever, cough and chest pain at PAT appointment   All instructions explained to the patient, with a verbal understanding of the material. Patient agrees to go over the instructions while at home for a better understanding. The opportunity to ask questions was provided.

## 2023-11-13 ENCOUNTER — Telehealth: Payer: Self-pay | Admitting: Orthopedic Surgery

## 2023-11-13 NOTE — Telephone Encounter (Signed)
 The extractions were done for pain and calculus buildup but not for infection.  No antibiotics prescribed.  I think she should be fine now 5 days out for continuing with the planned elective shoulder surgery which is not a joint implantation.  Thanks for calling the dentist

## 2023-11-13 NOTE — Telephone Encounter (Signed)
 Pt called stating she had a tooth pulled Friday and want to make sure she can still have surgery on 10/7. Pt states she is on no meds from dental pulling. Please call pt about this  matter AT [phone].

## 2023-11-14 ENCOUNTER — Encounter (HOSPITAL_COMMUNITY)
Admission: RE | Disposition: A | Discharge status: Home / Self Care | Payer: Self-pay | Source: Home / Self Care | Attending: Orthopedic Surgery

## 2023-11-14 ENCOUNTER — Ambulatory Visit (HOSPITAL_COMMUNITY): Admitting: Certified Registered"

## 2023-11-14 ENCOUNTER — Other Ambulatory Visit (HOSPITAL_COMMUNITY): Payer: Self-pay

## 2023-11-14 ENCOUNTER — Other Ambulatory Visit: Payer: Self-pay

## 2023-11-14 ENCOUNTER — Encounter (HOSPITAL_COMMUNITY): Payer: Self-pay | Admitting: Orthopedic Surgery

## 2023-11-14 ENCOUNTER — Ambulatory Visit (HOSPITAL_COMMUNITY)
Admission: RE | Admit: 2023-11-14 | Discharge: 2023-11-14 | Disposition: A | Discharge status: Home / Self Care | Attending: Orthopedic Surgery | Admitting: Orthopedic Surgery

## 2023-11-14 DIAGNOSIS — M75122 Complete rotator cuff tear or rupture of left shoulder, not specified as traumatic: Secondary | ICD-10-CM

## 2023-11-14 DIAGNOSIS — M2550 Pain in unspecified joint: Secondary | ICD-10-CM | POA: Insufficient documentation

## 2023-11-14 DIAGNOSIS — M75102 Unspecified rotator cuff tear or rupture of left shoulder, not specified as traumatic: Secondary | ICD-10-CM | POA: Diagnosis not present

## 2023-11-14 DIAGNOSIS — M7522 Bicipital tendinitis, left shoulder: Secondary | ICD-10-CM

## 2023-11-14 DIAGNOSIS — S43432A Superior glenoid labrum lesion of left shoulder, initial encounter: Secondary | ICD-10-CM | POA: Diagnosis not present

## 2023-11-14 DIAGNOSIS — G8918 Other acute postprocedural pain: Secondary | ICD-10-CM | POA: Diagnosis not present

## 2023-11-14 DIAGNOSIS — Z01818 Encounter for other preprocedural examination: Secondary | ICD-10-CM

## 2023-11-14 DIAGNOSIS — M65912 Unspecified synovitis and tenosynovitis, left shoulder: Secondary | ICD-10-CM

## 2023-11-14 HISTORY — PX: SHOULDER OPEN ROTATOR CUFF REPAIR: SHX2407

## 2023-11-14 HISTORY — PX: BICEPT TENODESIS: SHX5116

## 2023-11-14 HISTORY — PX: POSTERIOR LUMBAR FUSION 2 WITH HARDWARE REMOVAL: SHX7297

## 2023-11-14 SURGERY — ARTHROSCOPY, SHOULDER WITH DEBRIDEMENT
Anesthesia: General | Site: Shoulder | Laterality: Left

## 2023-11-14 MED ORDER — MIDAZOLAM HCL 2 MG/2ML IJ SOLN
1.0000 mg | Freq: Once | INTRAMUSCULAR | Status: AC
  Filled 2023-11-14: qty 1

## 2023-11-14 MED ORDER — ORAL CARE MOUTH RINSE: 15.0000 mL | Freq: Once | OROMUCOSAL | Status: AC

## 2023-11-14 MED ORDER — FENTANYL CITRATE (PF) 250 MCG/5ML IJ SOLN
INTRAMUSCULAR | Status: AC
  Filled 2023-11-14: qty 5

## 2023-11-14 MED ORDER — MIDAZOLAM HCL 2 MG/2ML IJ SOLN
INTRAMUSCULAR | Status: AC
  Administered 2023-11-14: 1 mg via INTRAVENOUS
  Filled 2023-11-14: qty 2

## 2023-11-14 MED ORDER — LACTATED RINGERS IV SOLN
INTRAVENOUS | Status: DC
Start: 2023-11-14 — End: 2023-11-14

## 2023-11-14 MED ORDER — METHOCARBAMOL 500 MG PO TABS
500.0000 mg | ORAL_TABLET | Freq: Three times a day (TID) | ORAL | 1 refills | Status: AC | PRN
  Filled 2023-11-14: qty 30, 10d supply, fill #0

## 2023-11-14 MED ORDER — ACETAMINOPHEN 10 MG/ML IV SOLN
1000.0000 mg | Freq: Once | INTRAVENOUS | Status: DC | PRN
  Administered 2023-11-14: 1000 mg via INTRAVENOUS

## 2023-11-14 MED ORDER — DROPERIDOL 2.5 MG/ML IJ SOLN: 0.6250 mg | Freq: Once | INTRAMUSCULAR | Status: DC | PRN

## 2023-11-14 MED ORDER — ACETAMINOPHEN 500 MG PO TABS
1000.0000 mg | ORAL_TABLET | Freq: Once | ORAL | Status: AC
  Administered 2023-11-14: 1000 mg via ORAL
  Filled 2023-11-14: qty 2

## 2023-11-14 MED ORDER — ONDANSETRON HCL 4 MG/2ML IJ SOLN
INTRAMUSCULAR | Status: DC | PRN
  Administered 2023-11-14: 4 mg via INTRAVENOUS

## 2023-11-14 MED ORDER — ROCURONIUM 10MG/ML (10ML) SYRINGE FOR MEDFUSION PUMP - OPTIME
INTRAVENOUS | Status: DC | PRN
  Administered 2023-11-14: 20 mg via INTRAVENOUS
  Administered 2023-11-14: 50 mg via INTRAVENOUS

## 2023-11-14 MED ORDER — ACETAMINOPHEN 10 MG/ML IV SOLN
INTRAVENOUS | Status: AC
  Filled 2023-11-14: qty 100

## 2023-11-14 MED ORDER — FENTANYL CITRATE (PF) 100 MCG/2ML IJ SOLN
INTRAMUSCULAR | Status: AC
  Administered 2023-11-14: 50 ug
  Filled 2023-11-14: qty 2

## 2023-11-14 MED ORDER — LACTATED RINGERS IV SOLN: INTRAVENOUS | Status: DC

## 2023-11-14 MED ORDER — SODIUM CHLORIDE 0.9 % IR SOLN
Status: DC | PRN
  Administered 2023-11-14: 3000 mL

## 2023-11-14 MED ORDER — DEXAMETHASONE SODIUM PHOSPHATE 10 MG/ML IJ SOLN
INTRAMUSCULAR | Status: AC
  Filled 2023-11-14: qty 1

## 2023-11-14 MED ORDER — DEXAMETHASONE SODIUM PHOSPHATE 10 MG/ML IJ SOLN
INTRAMUSCULAR | Status: DC | PRN
  Administered 2023-11-14: 10 mg via INTRAVENOUS

## 2023-11-14 MED ORDER — CEFAZOLIN SODIUM-DEXTROSE 2-4 GM/100ML-% IV SOLN
2.0000 g | INTRAVENOUS | Status: AC
  Administered 2023-11-14: 2 g via INTRAVENOUS
  Filled 2023-11-14: qty 100

## 2023-11-14 MED ORDER — PROPOFOL 10 MG/ML IV BOLUS
INTRAVENOUS | Status: DC | PRN
  Administered 2023-11-14: 130 mg via INTRAVENOUS

## 2023-11-14 MED ORDER — FENTANYL CITRATE (PF) 100 MCG/2ML IJ SOLN: 25.0000 ug | INTRAMUSCULAR | Status: DC | PRN

## 2023-11-14 MED ORDER — OXYCODONE HCL 5 MG/5ML PO SOLN: 5.0000 mg | Freq: Once | ORAL | Status: DC | PRN

## 2023-11-14 MED ORDER — SUGAMMADEX SODIUM 200 MG/2ML IV SOLN
INTRAVENOUS | Status: DC | PRN
  Administered 2023-11-14: 200 mg via INTRAVENOUS

## 2023-11-14 MED ORDER — PHENYLEPHRINE HCL (PRESSORS) 10 MG/ML IV SOLN
INTRAVENOUS | Status: AC
  Filled 2023-11-14: qty 1

## 2023-11-14 MED ORDER — BUPIVACAINE HCL (PF) 0.5 % IJ SOLN
INTRAMUSCULAR | Status: DC | PRN
  Administered 2023-11-14: 12 mL via PERINEURAL

## 2023-11-14 MED ORDER — BUPIVACAINE LIPOSOME 1.3 % IJ SUSP
INTRAMUSCULAR | Status: DC | PRN
  Administered 2023-11-14: 10 mL via PERINEURAL

## 2023-11-14 MED ORDER — SCOPOLAMINE 1 MG/3DAYS TD PT72
1.0000 | MEDICATED_PATCH | TRANSDERMAL | Status: DC
  Administered 2023-11-14: 1 mg via TRANSDERMAL
  Filled 2023-11-14: qty 1

## 2023-11-14 MED ORDER — TRANEXAMIC ACID-NACL 1000-0.7 MG/100ML-% IV SOLN
1000.0000 mg | INTRAVENOUS | Status: AC
  Administered 2023-11-14: 1000 mg via INTRAVENOUS
  Filled 2023-11-14: qty 100

## 2023-11-14 MED ORDER — FENTANYL CITRATE (PF) 100 MCG/2ML IJ SOLN
50.0000 ug | Freq: Once | INTRAMUSCULAR | Status: DC
  Filled 2023-11-14: qty 1

## 2023-11-14 MED ORDER — PHENYLEPHRINE HCL-NACL 20-0.9 MG/250ML-% IV SOLN
INTRAVENOUS | Status: DC | PRN
Start: 2023-11-14 — End: 2023-11-14
  Administered 2023-11-14: 50 ug/min via INTRAVENOUS

## 2023-11-14 MED ORDER — FENTANYL CITRATE (PF) 250 MCG/5ML IJ SOLN
INTRAMUSCULAR | Status: DC | PRN
  Administered 2023-11-14: 100 ug via INTRAVENOUS

## 2023-11-14 MED ORDER — CHLORHEXIDINE GLUCONATE 0.12 % MT SOLN
15.0000 mL | Freq: Once | OROMUCOSAL | Status: AC
  Administered 2023-11-14: 15 mL via OROMUCOSAL
  Filled 2023-11-14: qty 15

## 2023-11-14 MED ORDER — ONDANSETRON HCL 4 MG/2ML IJ SOLN
INTRAMUSCULAR | Status: AC
  Filled 2023-11-14: qty 2

## 2023-11-14 MED ORDER — PHENYLEPHRINE HCL (PRESSORS) 10 MG/ML IV SOLN
INTRAVENOUS | Status: DC | PRN
  Administered 2023-11-14 (×2): 160 ug via INTRAVENOUS

## 2023-11-14 MED ORDER — OXYCODONE HCL 5 MG PO TABS
5.0000 mg | ORAL_TABLET | ORAL | 0 refills | Status: AC | PRN
  Filled 2023-11-14: qty 30, 5d supply, fill #0

## 2023-11-14 MED ORDER — OXYCODONE HCL 5 MG PO TABS: 5.0000 mg | ORAL_TABLET | Freq: Once | ORAL | Status: DC | PRN

## 2023-11-14 MED ORDER — CELECOXIB 100 MG PO CAPS
100.0000 mg | ORAL_CAPSULE | Freq: Two times a day (BID) | ORAL | 0 refills | Status: DC
  Filled 2023-11-14: qty 60, 30d supply, fill #0

## 2023-11-14 MED ORDER — LIDOCAINE 2% (20 MG/ML) 5 ML SYRINGE
INTRAMUSCULAR | Status: DC | PRN
  Administered 2023-11-14: 60 mg via INTRAVENOUS

## 2023-11-14 SURGICAL SUPPLY — 56 items
ANCHOR FBRTK 2.6 SUTURETAP 1.3 (Anchor) IMPLANT
ANCHOR SUT 1.8 FIBERTAK SB KL (Anchor) IMPLANT
ANCHOR SWIVELOCK BIO 4.75X19.1 (Anchor) IMPLANT
BAG COUNTER SPONGE SURGICOUNT (BAG) ×3 IMPLANT
BENZOIN TINCTURE PRP APPL 2/3 (GAUZE/BANDAGES/DRESSINGS) ×3 IMPLANT
BLADE SURG 11 STRL SS (BLADE) IMPLANT
BLADE SURG 15 STRL LF DISP TIS (BLADE) IMPLANT
CLSR STERI-STRIP ANTIMIC 1/2X4 (GAUZE/BANDAGES/DRESSINGS) IMPLANT
COOLER ICEMAN CLASSIC (MISCELLANEOUS) IMPLANT
COVER SURGICAL LIGHT HANDLE (MISCELLANEOUS) ×3 IMPLANT
DRAPE INCISE IOBAN 66X45 STRL (DRAPES) ×3 IMPLANT
DRAPE SURG ORHT 6 SPLT 77X108 (DRAPES) ×6 IMPLANT
DRAPE U-SHAPE 47X51 STRL (DRAPES) ×3 IMPLANT
DRSG ADAPTIC 3X8 NADH LF (GAUZE/BANDAGES/DRESSINGS) IMPLANT
DRSG TEGADERM 4X4.75 (GAUZE/BANDAGES/DRESSINGS) IMPLANT
DURAPREP 26ML APPLICATOR (WOUND CARE) ×3 IMPLANT
ELECT CAUTERY BLADE 6.4 (BLADE) ×3 IMPLANT
ELECTRODE REM PT RTRN 9FT ADLT (ELECTROSURGICAL) ×3 IMPLANT
GAUZE PAD ABD 8X10 STRL (GAUZE/BANDAGES/DRESSINGS) ×3 IMPLANT
GAUZE SPONGE 4X4 12PLY STRL (GAUZE/BANDAGES/DRESSINGS) ×3 IMPLANT
GAUZE XEROFORM 1X8 LF (GAUZE/BANDAGES/DRESSINGS) ×3 IMPLANT
GLOVE ECLIPSE 8.0 STRL XLNG CF (GLOVE) ×3 IMPLANT
GOWN STRL REUS W/ TWL LRG LVL3 (GOWN DISPOSABLE) ×6 IMPLANT
KIT BASIN OR (CUSTOM PROCEDURE TRAY) ×3 IMPLANT
KIT TURNOVER KIT B (KITS) ×3 IMPLANT
MANIFOLD NEPTUNE II (INSTRUMENTS) ×3 IMPLANT
NDL 1/2 CIR CATGUT .05X1.09 (NEEDLE) IMPLANT
NDL HD SCORPION MEGA LOADER (NEEDLE) IMPLANT
NDL HYPO 25GX1X1/2 BEV (NEEDLE) ×3 IMPLANT
NDL SPNL 18GX3.5 QUINCKE PK (NEEDLE) IMPLANT
NEEDLE 1/2 CIR CATGUT .05X1.09 (NEEDLE) IMPLANT
NEEDLE HYPO 25GX1X1/2 BEV (NEEDLE) ×2 IMPLANT
NEEDLE SPNL 18GX3.5 QUINCKE PK (NEEDLE) IMPLANT
PACK SHOULDER (CUSTOM PROCEDURE TRAY) ×3 IMPLANT
PAD ARMBOARD POSITIONER FOAM (MISCELLANEOUS) ×6 IMPLANT
PAD COLD SHLDR WRAP-ON (PAD) IMPLANT
PASSER SUT SWANSON 36MM LOOP (INSTRUMENTS) IMPLANT
SLING ARM FOAM STRAP LRG (SOFTGOODS) IMPLANT
SOLN 0.9% NACL 1000 ML (IV SOLUTION) ×2 IMPLANT
SOLN 0.9% NACL POUR BTL 1000ML (IV SOLUTION) ×3 IMPLANT
SOLN STERILE WATER 1000 ML (IV SOLUTION) ×2 IMPLANT
SOLN STERILE WATER BTL 1000 ML (IV SOLUTION) ×3 IMPLANT
SPIKE FLUID TRANSFER (MISCELLANEOUS) IMPLANT
SPONGE T-LAP 4X18 ~~LOC~~+RFID (SPONGE) ×6 IMPLANT
SUT MNCRL AB 3-0 PS2 18 (SUTURE) IMPLANT
SUT VIC AB 2-0 CT2 27 (SUTURE) ×3 IMPLANT
SUT VICRYL 0 UR6 27IN ABS (SUTURE) IMPLANT
SUTURE 0 FIBERLP 38 BLU TPR ND (SUTURE) IMPLANT
SUTURE FIBERWR #2 38 T-5 BLUE (SUTURE) ×9 IMPLANT
SYR CONTROL 10ML LL (SYRINGE) ×3 IMPLANT
SYSTEM FBRTK BUTTON 2.6 (Anchor) IMPLANT
TOWEL GREEN STERILE (TOWEL DISPOSABLE) ×3 IMPLANT
TOWEL GREEN STERILE FF (TOWEL DISPOSABLE) ×3 IMPLANT
TRAY FOLEY MTR SLVR 16FR STAT (SET/KITS/TRAYS/PACK) IMPLANT
TUBING ARTHROSCOPY IRRIG 16FT (MISCELLANEOUS) IMPLANT
WAND ABLATOR APOLLO I90 (BUR) IMPLANT

## 2023-11-14 NOTE — Op Note (Unsigned)
 NAME: Mary Burton, Mary Burton MEDICAL RECORD NO: [id number] ACCOUNT NO: [id number] DATE OF BIRTH: 1960-02-25 FACILITY: [facility] LOCATION: [facility] PHYSICIAN: Cordella RAMAN. Addie, MD  Operative Report   DATE OF PROCEDURE: 11/14/2023  PREOPERATIVE DIAGNOSES: 1.  Left shoulder rotator cuff tear. 2.  Biceps tendinitis.  POSTOPERATIVE DIAGNOSES: 1.  Left shoulder rotator cuff tear. 2.  Biceps tendinitis.  PROCEDURES PERFORMED:  Left shoulder arthroscopy with debridement of the superior labrum and mild articular cartilage arthritis with subsequent mini-open biceps tenodesis and mini-open rotator cuff tear repair using a 2 x 2 construct of a 2 x 2 cm tear.  SURGEON:  Cordella RAMAN. Addie, MD  ASSISTANT:  Herlene Calix, PA.  INDICATIONS:  The patient is a 63 year old patient with left shoulder pain refractory to nonoperative management.  She presents for operative management, after explanation of the risks and benefits of the procedure.  DESCRIPTION OF PROCEDURE:  The patient was brought to the operating room where general endotracheal anesthesia was induced.  Preoperative antibiotics were administered. Timeout was called. The patient was placed in the beach chair position with the head  in a neutral position. The shoulder, arm, and hand were then prescrubbed with alcohol and Betadine and allowed to air dry, prepped with DuraPrep solution and draped in a sterile manner.  Ioban was then used to seal the operative field and cover the  axilla.  The patient's preoperative range of motion was about 60/100/165.  After calling timeout, a posterior portal was created.  An anterior portal was created under direct visualization.  Diagnostic arthroscopy was performed.  The patient did have a rotator cuff tear involving the supraspinatus with a delaminated component.   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  A type  2 SLAP tear was also present.  Biceps tendinitis was present.  An ArthroCare wand was introduced  through the anterior portal, and the superior labrum was debrided from the 10 o'clock to 2 o'clock position and the biceps tendon was released.  Chondral debridement was also performed on any loose chondral flaps around the glenoid or humeral head.  Instruments were removed. Portals were closed using 3-0 nylon.  Ioban was then used to cover the entire operative field.  An incision was made off the anterolateral margin of the acromion. Skin and subcutaneous tissue were sharply divided.  A stay suture was placed at the raphe between the anterior and medial deltoids measuring a distance of 4 cm from the anterolateral  margin of the acromion. The raphe was then divided. Bursectomy was completed. The transverse humeral ligament was opened, and the biceps tendon was tenodesed into the bicipital groove under appropriate tension after placing a FiberLoop suture through the  tendon.  Used 2 suture anchors, which were knotless.  Oversewn with 0 Vicryl x 3.  Next, attention was directed towards the rotator cuff tear. A 2 x 2 tear was present, which was about 90% thickness. This was then completed with a #15 blade, and the landing zone on the tuberosity was prepared.  We then placed four 0 Vicryl grasping  sutures through the leading edge of the tendon and then placed 2 Arthrex SutureTape anchors at the junction of the articular surface and the greater tuberosity. Using a Scorpion, those suture tapes were placed and were passed x 8 from posterior to  anterior.  The rotator cuff tear was then reduced to its attachment site, and  the suture limbs were tied and crossed.  We then matched up the anterior 4 suture limbs with the anterior 2 FiberWires and the same thing for the posterior suture limbs.  They  were then placed into a SwiveLock suture with the arm in abduction. A watertight repair was achieved.  The same range of  motion was present.  Thorough irrigation was then performed. Instruments were removed. An acromioplasty was performed prior to  removing the instruments.  After irrigation, the deltopectoral interval was closed using #1 Vicryl suture followed by 0 Vicryl suture, 2-0 Vicryl suture, and 3-0 Monocryl with Steri-Strips and impervious dressings were applied.  The patient was  transferred to the recovery room in stable condition.    MUK D: 11/14/2023 4:09:59 pm T: 11/14/2023 11:49:00 pm  JOB: [id number]/ [id number]

## 2023-11-14 NOTE — Progress Notes (Signed)
 Image of discoloration seen by [name] per OR nurse, OK to proceed with preop.

## 2023-11-14 NOTE — Brief Op Note (Signed)
   11/14/2023  3:54 PM  PATIENT:  Dickey PARAS Soller  63 y.o. female  PRE-OPERATIVE DIAGNOSIS:  left shoulder rotator cuff tear, biceps tendinitis  POST-OPERATIVE DIAGNOSIS:  left shoulder rotator cuff tear, biceps tendinitis  PROCEDURE:  Procedure(s): ARTHROSCOPY, SHOULDER WITH DEBRIDEMENT REPAIR, ROTATOR CUFF, OPEN TENODESIS, BICEPS  SURGEON:  Surgeon(s): Addie, Cordella Hamilton, MD  ASSISTANT: magnant pa  ANESTHESIA:   general  EBL: 50 ml    Total I/O In: 1000 [I.V.:1000] Out: -   BLOOD ADMINISTERED: none  DRAINS: none   LOCAL MEDICATIONS USED:  none  SPECIMEN:  No Specimen  COUNTS:  YES  TOURNIQUET:  * No tourniquets in log *  DICTATION: .Other Dictation: Dictation Number [id number]  PLAN OF CARE: Discharge to home after PACU  PATIENT DISPOSITION:  PACU - hemodynamically stable

## 2023-11-14 NOTE — Anesthesia Preprocedure Evaluation (Addendum)
 Anesthesia Evaluation  Patient identified by MRN, date of birth, ID band Patient awake    Reviewed: Allergy & Precautions, NPO status , Patient's Chart, lab work & pertinent test results  Airway Mallampati: II  TM Distance: >3 FB Neck ROM: Full    Dental  (+) Teeth Intact, Dental Advisory Given   Pulmonary neg pulmonary ROS   breath sounds clear to auscultation       Cardiovascular negative cardio ROS  Rhythm:Regular Rate:Normal     Neuro/Psych negative neurological ROS  negative psych ROS   GI/Hepatic negative GI ROS, Neg liver ROS,,,  Endo/Other  negative endocrine ROS    Renal/GU negative Renal ROS     Musculoskeletal  (+) Arthritis ,    Abdominal   Peds  Hematology negative hematology ROS (+)   Anesthesia Other Findings   Reproductive/Obstetrics                              Anesthesia Physical Anesthesia Plan  ASA: 2  Anesthesia Plan: General   Post-op Pain Management: Regional block*   Induction: Intravenous  PONV Risk Score and Plan: 4 or greater and Ondansetron, Dexamethasone , Midazolam and Scopolamine patch - Pre-op  Airway Management Planned: Oral ETT  Additional Equipment: None  Intra-op Plan:   Post-operative Plan: Extubation in OR  Informed Consent: I have reviewed the patients History and Physical, chart, labs and discussed the procedure including the risks, benefits and alternatives for the proposed anesthesia with the patient or authorized representative who has indicated his/her understanding and acceptance.     Dental advisory given  Plan Discussed with: CRNA  Anesthesia Plan Comments:          Anesthesia Quick Evaluation

## 2023-11-14 NOTE — H&P (Addendum)
 Mary Burton is an 63 y.o. female.   Chief Complaint: Left shoulder pain HPI: Mary Burton is a 63 y.o. female who presents  reporting right hand pain and left shoulder pain.  Here to review left shoulder MRI scan.  Reports continued left shoulder pain with severe pain in the anterolateral aspect of the shoulder.  She has difficulty lifting her arm due to pain.  Pain wakes her up every night when she tries to sleep.  Denies any significant mechanical symptoms in the shoulder.   MRI scan shows rotator cuff pathology consisting of the delaminating tear involving the supraspinatus.  It does look like near the biceps groove there is a full-thickness component with extravasation of fluid into the subacromial space.  Nothing is change on this H&P since it was filed 6 hours ago.  Patient has a rash on the left arm which is not in the operative field and does not appear infectious.  Past Medical History:  Diagnosis Date   Allergy    Arthritis    Environmental allergies    Gout    r hand   Pre-diabetes     Past Surgical History:  Procedure Laterality Date   COLONOSCOPY  10/12/2011   [facility]   OVARIAN CYST REMOVAL     POLYPECTOMY     TUBAL LIGATION  1985    Family History  Problem Relation Age of Onset   Colon cancer Mother        unsure age   Hypertension Father    Early death Sister    High blood pressure Sister    Colon polyps Neg Hx    Esophageal cancer Neg Hx    Rectal cancer Neg Hx    Stomach cancer Neg Hx    Social History:  reports that she has never smoked. She has never used smokeless tobacco. She reports that she does not drink alcohol and does not use drugs.  Allergies: No Known Allergies  No medications prior to admission.    No results found for this or any previous visit (from the past 48 hours). No results found.  Review of Systems  Musculoskeletal:  Positive for arthralgias.  All other systems reviewed and are negative.   Last menstrual period  05/22/2011. Physical Exam Vitals reviewed.  HENT:     Head: Normocephalic.     Nose: Nose normal.     Mouth/Throat:     Mouth: Mucous membranes are moist.  Eyes:     Pupils: Pupils are equal, round, and reactive to light.  Cardiovascular:     Rate and Rhythm: Normal rate.     Pulses: Normal pulses.  Pulmonary:     Effort: Pulmonary effort is normal.  Abdominal:     General: Abdomen is flat.  Musculoskeletal:     Cervical back: Normal range of motion.  Skin:    General: Skin is warm.     Capillary Refill: Capillary refill takes less than 2 seconds.  Neurological:     General: No focal deficit present.     Mental Status: She is alert.  Psychiatric:        Mood and Affect: Mood normal.   Ortho exam demonstrates functional deltoid on the left.  No discrete AC joint tenderness.  She does have external rotation weakness at 4 out of 5 on the left compared to 5 out of 5 on the right.  Subscap strength is intact.  Positive bicipital groove tenderness on the left with positive O'Brien's testing.  Little bit of crepitus with passive range of motion of that left shoulder compared to the right.  Motor or sensory function of the left hand is intact.  Painful range of motion is present which may be masking early adhesive capsulitis.  Assessment/Plan Impression is left shoulder pain with likely rotator cuff pathology accounting for her symptoms.  She has failed a long course of nonoperative and conservative treatment.  She was scheduled for surgery earlier this year but had a fall.  She has since recovered from that sacral fracture.  She also recently had 2 teeth extracted for noninfectious crown issues.  She is now 6 days out from that procedure and is doing well with no signs or symptoms of systemic illness.  KANDICE Glendia Hutchinson, MD 11/14/2023, 6:22 AM

## 2023-11-14 NOTE — Anesthesia Procedure Notes (Signed)
 Anesthesia Regional Block: Interscalene brachial plexus block   Pre-Anesthetic Checklist: , timeout performed,  Correct Patient, Correct Site, Correct Laterality,  Correct Procedure, Correct Position, site marked,  Risks and benefits discussed,  Surgical consent,  Pre-op evaluation,  At surgeon's request and post-op pain management  Laterality: Left  Prep: chloraprep       Needles:  Injection technique: Single-shot  Needle Type: Echogenic Stimulator Needle     Needle Length: 9cm  Needle Gauge: 21     Additional Needles:   Procedures:,,,, ultrasound used (permanent image in chart),,    Narrative:  Start time: 11/14/2023 12:10 PM End time: 11/14/2023 12:15 PM Injection made incrementally with aspirations every 5 mL.  Performed by: Personally  Anesthesiologist: Tilford Franky BIRCH, MD  Additional Notes: Discussed risks and benefits of the nerve block in detail, including but not limited vascular injury, permanent nerve damage and infection.   Patient tolerated the procedure well. Local anesthetic introduced in an incremental fashion under minimal resistance after negative aspirations. No paresthesias were elicited. After completion of the procedure, no acute issues were identified and patient continued to be monitored by RN.

## 2023-11-14 NOTE — Transfer of Care (Signed)
 Immediate Anesthesia Transfer of Care Note  Patient: Mary Burton  Procedure(s) Performed: ARTHROSCOPY, SHOULDER WITH DEBRIDEMENT (Left) REPAIR, ROTATOR CUFF, OPEN (Left: Shoulder) TENODESIS, BICEPS (Left)  Patient Location: PACU  Anesthesia Type:General and Regional  Level of Consciousness: drowsy  Airway & Oxygen Therapy: Patient Spontanous Breathing and Patient connected to face mask oxygen  Post-op Assessment: Report given to RN and Post -op Vital signs reviewed and stable  Post vital signs: Reviewed and stable  Last Vitals:  Vitals Value Taken Time  BP 134/75 11/14/23 16:16  Temp    Pulse 94 11/14/23 16:17  Resp 6 11/14/23 16:17  SpO2 100 % 11/14/23 16:17  Vitals shown include unfiled device data.  Last Pain:  Vitals:   11/14/23 1002  TempSrc: Oral         Complications: No notable events documented.

## 2023-11-14 NOTE — Progress Notes (Signed)
 Discoloration noted to patients left upper arm. Discoloration noted to inside of patient's left knee also. Pt states this rash showed up a week and a half or two weeks ago. It does not not itch and does not hurt. Area appears as flat, brown, blotchy areas. Pt states she has not changed soap, detergent, ect. No discoloration to right side, or any other area of patient's body. OR called to notify [name], he is currently in surgery an not available at this time, but will be updated when available.

## 2023-11-15 ENCOUNTER — Encounter (HOSPITAL_COMMUNITY): Payer: Self-pay | Admitting: Orthopedic Surgery

## 2023-11-15 ENCOUNTER — Other Ambulatory Visit (HOSPITAL_COMMUNITY): Payer: Self-pay

## 2023-11-15 ENCOUNTER — Telehealth: Payer: Self-pay | Admitting: Orthopedic Surgery

## 2023-11-15 ENCOUNTER — Other Ambulatory Visit: Payer: Self-pay | Admitting: Orthopedic Surgery

## 2023-11-15 MED ORDER — TEMAZEPAM 15 MG PO CAPS
15.0000 mg | ORAL_CAPSULE | Freq: Every evening | ORAL | 0 refills | Status: AC | PRN
  Filled 2023-11-15: qty 10, 10d supply, fill #0

## 2023-11-15 NOTE — Telephone Encounter (Signed)
 Patient called and ask if she could get something to get some rest. She isn't sleeping because of the pain. CB#[phone]

## 2023-11-15 NOTE — Telephone Encounter (Signed)
 Restoril sent thx

## 2023-11-15 NOTE — Anesthesia Postprocedure Evaluation (Signed)
 Anesthesia Post Note  Patient: Mary Burton  Procedure(s) Performed: ARTHROSCOPY, SHOULDER WITH DEBRIDEMENT (Left) REPAIR, ROTATOR CUFF, OPEN (Left: Shoulder) TENODESIS, BICEPS (Left)     Anesthesia Type: General Anesthetic complications: no   No notable events documented.  Last Vitals:  Vitals:   11/14/23 1715 11/14/23 1730  BP: 114/68 110/65  Pulse: 85 88  Resp: 15 18  Temp:  36.4 C  SpO2: 97% 95%    Last Pain:  Vitals:   11/14/23 1645  TempSrc:   PainSc: 0-No pain                 Norleen Pope

## 2023-11-16 NOTE — Telephone Encounter (Signed)
 I advised pt. She said she has also been having headaches since yesterday. I advised her to see pcp but she said she just changed her pcp so she isnt sure if she can get in. Please advise

## 2023-11-22 ENCOUNTER — Other Ambulatory Visit: Payer: Self-pay | Admitting: Orthopedic Surgery

## 2023-11-22 ENCOUNTER — Ambulatory Visit: Admitting: Orthopedic Surgery

## 2023-11-22 ENCOUNTER — Other Ambulatory Visit (HOSPITAL_COMMUNITY): Payer: Self-pay

## 2023-11-22 ENCOUNTER — Encounter: Payer: Self-pay | Admitting: Orthopedic Surgery

## 2023-11-22 DIAGNOSIS — Z9889 Other specified postprocedural states: Secondary | ICD-10-CM

## 2023-11-22 MED ORDER — OXYCODONE HCL 5 MG PO TABS
5.0000 mg | ORAL_TABLET | Freq: Three times a day (TID) | ORAL | 0 refills | Status: AC | PRN
  Filled 2023-11-22: qty 30, 10d supply, fill #0

## 2023-11-22 MED ORDER — CELECOXIB 100 MG PO CAPS
100.0000 mg | ORAL_CAPSULE | Freq: Two times a day (BID) | ORAL | 1 refills | Status: AC
  Filled 2023-11-22: qty 30, 15d supply, fill #0

## 2023-11-22 NOTE — Progress Notes (Signed)
 Post-Op Visit Note   Patient: Mary Burton           Date of Birth: Oct 20, 1960           MRN: [id number] Visit Date: 11/22/2023 PCP: Mary Boby CROME, NP-C   Assessment & Plan:  Chief Complaint:  Chief Complaint  Patient presents with   Left Shoulder - Routine Post Op   Visit Diagnoses:  1. S/P arthroscopy of left shoulder     Plan: Mary Burton is now 1 week out arthroscopy with 2 x 2 rotator cuff tear repair and biceps tenodesis.  She has been in a sling.  She is on Tylenol  for symptoms.  Oxycodone  did not agree with her.  On exam she has stiffer than normal shoulder.  Would like to discontinue the portal sutures.  Incision intact.  Start physical therapy here next week 3 times a week for 6 weeks for shoulder passive range of motion and active assisted range of motion.  Exercise sheets provided.  I think for her to get this shoulder moving she will have to spend about at least 1 hour a day if not more doing exercises which are passive range of motion exercises only.  Exercise sheets provided.  Refill Celebrex.  Follow-up in 4 weeks for clinical recheck.  Discontinue the sling on Monday.  Follow-Up Instructions: No follow-ups on file.   Orders:  Orders Placed This Encounter  Procedures   Ambulatory referral to Physical Therapy   Meds ordered this encounter  Medications   celecoxib (CELEBREX) 100 MG capsule    Sig: Take 1 capsule (100 mg total) by mouth 2 (two) times daily.    Dispense:  30 capsule    Refill:  1    Imaging: No results found.  PMFS History: Patient Active Problem List   Diagnosis Date Noted   Pain in right shoulder 10/05/2023   Idiopathic gout of multiple sites 10/05/2023   Right flank pain 08/25/2023   Pain in left shoulder 08/18/2022   Acute cystitis with hematuria 10/06/2021   Prediabetes 10/06/2021   Obesity (BMI 30.0-34.9) 10/06/2021   Impingement syndrome of right shoulder 07/02/2021   Bilateral foot pain 04/02/2014   Bilateral swelling of  feet 06/06/2011   Seasonal allergies 06/06/2011   Bilateral knee pain 06/06/2011   Past Medical History:  Diagnosis Date   Allergy    Arthritis    Environmental allergies    Gout    r hand   Pre-diabetes     Family History  Problem Relation Age of Onset   Colon cancer Mother        unsure age   Hypertension Father    Early death Sister    High blood pressure Sister    Colon polyps Neg Hx    Esophageal cancer Neg Hx    Rectal cancer Neg Hx    Stomach cancer Neg Hx     Past Surgical History:  Procedure Laterality Date   BICEPT TENODESIS Left 11/14/2023   Procedure: TENODESIS, BICEPS;  Surgeon: Addie Cordella Hamilton, MD;  Location: [facility] OR;  Service: Orthopedics;  Laterality: Left;   COLONOSCOPY  10/12/2011   Pyrtle   OVARIAN CYST REMOVAL     POLYPECTOMY     POSTERIOR LUMBAR FUSION 2 WITH HARDWARE REMOVAL Left 11/14/2023   Procedure: ARTHROSCOPY, SHOULDER WITH DEBRIDEMENT;  Surgeon: Addie Cordella Hamilton, MD;  Location: [facility] OR;  Service: Orthopedics;  Laterality: Left;   SHOULDER OPEN ROTATOR CUFF REPAIR Left 11/14/2023   Procedure:  REPAIR, ROTATOR CUFF, OPEN;  Surgeon: Addie Cordella Hamilton, MD;  Location: [facility] OR;  Service: Orthopedics;  Laterality: Left;   TUBAL LIGATION  1985   Social History   Occupational History   Not on file  Tobacco Use   Smoking status: Never   Smokeless tobacco: Never  Vaping Use   Vaping status: Never Used  Substance and Sexual Activity   Alcohol use: No   Drug use: No   Sexual activity: Yes    Birth control/protection: None

## 2023-11-22 NOTE — Telephone Encounter (Signed)
 Meds sent

## 2023-11-23 ENCOUNTER — Other Ambulatory Visit (HOSPITAL_COMMUNITY): Payer: Self-pay

## 2023-11-30 ENCOUNTER — Encounter: Payer: Self-pay | Admitting: Physician Assistant

## 2023-11-30 ENCOUNTER — Ambulatory Visit: Admitting: Physical Therapy

## 2023-11-30 ENCOUNTER — Ambulatory Visit: Payer: Self-pay

## 2023-11-30 ENCOUNTER — Encounter: Payer: Self-pay | Admitting: Physical Therapy

## 2023-11-30 ENCOUNTER — Ambulatory Visit: Admitting: Physician Assistant

## 2023-11-30 DIAGNOSIS — M79641 Pain in right hand: Secondary | ICD-10-CM

## 2023-11-30 DIAGNOSIS — R600 Localized edema: Secondary | ICD-10-CM

## 2023-11-30 DIAGNOSIS — M25562 Pain in left knee: Secondary | ICD-10-CM | POA: Diagnosis not present

## 2023-11-30 DIAGNOSIS — M170 Bilateral primary osteoarthritis of knee: Secondary | ICD-10-CM | POA: Diagnosis not present

## 2023-11-30 DIAGNOSIS — M6281 Muscle weakness (generalized): Secondary | ICD-10-CM

## 2023-11-30 DIAGNOSIS — M1711 Unilateral primary osteoarthritis, right knee: Secondary | ICD-10-CM

## 2023-11-30 DIAGNOSIS — M1712 Unilateral primary osteoarthritis, left knee: Secondary | ICD-10-CM

## 2023-11-30 DIAGNOSIS — M25561 Pain in right knee: Secondary | ICD-10-CM | POA: Diagnosis not present

## 2023-11-30 DIAGNOSIS — M25512 Pain in left shoulder: Secondary | ICD-10-CM | POA: Diagnosis not present

## 2023-11-30 DIAGNOSIS — M25612 Stiffness of left shoulder, not elsewhere classified: Secondary | ICD-10-CM

## 2023-11-30 DIAGNOSIS — R293 Abnormal posture: Secondary | ICD-10-CM | POA: Diagnosis not present

## 2023-11-30 MED ORDER — LIDOCAINE HCL (PF) 1 % IJ SOLN
5.0000 mL | INTRAMUSCULAR | Status: AC | PRN
  Administered 2023-11-30: 5 mL

## 2023-11-30 MED ORDER — METHYLPREDNISOLONE ACETATE 40 MG/ML IJ SUSP
40.0000 mg | INTRAMUSCULAR | Status: AC | PRN
  Administered 2023-11-30: 40 mg via INTRA_ARTICULAR

## 2023-11-30 NOTE — Progress Notes (Signed)
 Office Visit Note   Patient: Mary Burton           Date of Birth: Sep 27, 1960           MRN: [id number] Visit Date: 11/30/2023              Requested by: Lendia Boby CROME, NP-C [address] [place],  [place] [address] PCP: Lendia Boby CROME, NP-C  Chief Complaint  Patient presents with   Left Knee - Edema   Right Knee - Edema      HPI: 63 y/o female with B knee pain and edema.  Her legs get stiff and painful after sitting for a while.  She has had fluid on her knees in the past.  She states that anti inflammatory medication don't seem to help her much and she has taken tylenol .    She also points out she has right hand edema at times and pain with active flexion and extension.  Today she denies edema, but has stiffness in the joints.  Her hand feels better today.     She is s/p left shoulder arthroscopy and debridement for left rotator cuff tear by [name].    Assessment & Plan: Visit Diagnoses:  1. Acute pain of both knees   2. Arthritis of knee, left   3. Arthritis of knee, right   4. Pain in right hand     Plan: Bilateral knee injections 5:1 tolerated well over all.  I recommended Voltaren  gel for her hand pain, she does not like to take anti inflammatories.  She will take tylenol  for pain.  Activity as tolerates.    Follow-Up Instructions: Return if symptoms worsen or fail to improve.   Ortho Exam  Patient is alert, oriented, no adenopathy, well-dressed, normal affect, normal respiratory effort. Medial joint line tenderness B knees L > R.  No fluctuance  No fluid shift with palpation over the medial joint to the lateral.  No cellulitis/erythema.  No crepitus noted over the patella with active flexion and extension.    On exam of the right hand she has generalized pain with grip.  No point tenderness.  No edema, no muscle loss evidence of atrophy.  Palpable radial pulse, cap refill is brisk.  No bone joint prominence.     Imaging: Medial joint line spurring  and decreased joint space bilaterally.  Minimal subchondral cyst. Osteoarthritis B medial compartments.   Labs: Lab Results  Component Value Date   HGBA1C 6.4 09/29/2023   HGBA1C 6.0 10/06/2021   HGBA1C 5.8 02/28/2013   LABURIC 4.0 12/12/2022   LABURIC 4.3 04/02/2014     Lab Results  Component Value Date   ALBUMIN 4.0 10/06/2021   ALBUMIN 3.9 04/02/2014   ALBUMIN 4.0 12/11/2012    No results found for: MG No results found for: VD25OH  No results found for: PREALBUMIN    Latest Ref Rng & Units 11/08/2023    2:00 PM 09/26/2023    9:30 AM 10/06/2021    3:23 PM  CBC EXTENDED  WBC 4.0 - 10.5 K/uL 7.1  5.2  5.1   RBC 3.87 - 5.11 MIL/uL 4.15  4.24  4.22   Hemoglobin 12.0 - 15.0 g/dL 88.2  87.8  87.9   HCT 36.0 - 46.0 % 36.1  38.0  36.9   Platelets 150 - 400 K/uL 357  285  201.0   NEUT# 1.4 - 7.7 K/uL   2.9   Lymph# 0.7 - 4.0 K/uL  1.6      There is no height or weight on file to calculate BMI.  Orders:  Orders Placed This Encounter  Procedures   Large Joint Inj   XR Knee 1-2 Views Left   XR Knee 1-2 Views Right   No orders of the defined types were placed in this encounter.    Procedures: Large Joint Inj: bilateral knee on 11/30/2023 11:28 AM Indications: pain and diagnostic evaluation Details: 22 G 1.5 in needle  Arthrogram: No  Medications (Right): 5 mL lidocaine  (PF) 1 %; 40 mg methylPREDNISolone  acetate 40 MG/ML Medications (Left): 5 mL lidocaine  (PF) 1 %; 40 mg methylPREDNISolone  acetate 40 MG/ML Outcome: tolerated well, no immediate complications Procedure, treatment alternatives, risks and benefits explained, specific risks discussed. Consent was given by the patient. Immediately prior to procedure a time out was called to verify the correct patient, procedure, equipment, support staff and site/side marked as required. Patient was prepped and draped in the usual sterile fashion.      Clinical Data: No additional findings.  ROS:  All other  systems negative, except as noted in the HPI. Review of Systems  Objective: Vital Signs: LMP 05/22/2011   Specialty Comments:  No specialty comments available.  PMFS History: Patient Active Problem List   Diagnosis Date Noted   Pain in right shoulder 10/05/2023   Idiopathic gout of multiple sites 10/05/2023   Right flank pain 08/25/2023   Pain in left shoulder 08/18/2022   Acute cystitis with hematuria 10/06/2021   Prediabetes 10/06/2021   Obesity (BMI 30.0-34.9) 10/06/2021   Impingement syndrome of right shoulder 07/02/2021   Bilateral foot pain 04/02/2014   Bilateral swelling of feet 06/06/2011   Seasonal allergies 06/06/2011   Bilateral knee pain 06/06/2011   Past Medical History:  Diagnosis Date   Allergy    Arthritis    Environmental allergies    Gout    r hand   Pre-diabetes     Family History  Problem Relation Age of Onset   Colon cancer Mother        unsure age   Hypertension Father    Early death Sister    High blood pressure Sister    Colon polyps Neg Hx    Esophageal cancer Neg Hx    Rectal cancer Neg Hx    Stomach cancer Neg Hx     Past Surgical History:  Procedure Laterality Date   BICEPT TENODESIS Left 11/14/2023   Procedure: TENODESIS, BICEPS;  Surgeon: Addie Cordella Hamilton, MD;  Location: [facility] OR;  Service: Orthopedics;  Laterality: Left;   COLONOSCOPY  10/12/2011   Pyrtle   OVARIAN CYST REMOVAL     POLYPECTOMY     POSTERIOR LUMBAR FUSION 2 WITH HARDWARE REMOVAL Left 11/14/2023   Procedure: ARTHROSCOPY, SHOULDER WITH DEBRIDEMENT;  Surgeon: Addie Cordella Hamilton, MD;  Location: [facility] OR;  Service: Orthopedics;  Laterality: Left;   SHOULDER OPEN ROTATOR CUFF REPAIR Left 11/14/2023   Procedure: REPAIR, ROTATOR CUFF, OPEN;  Surgeon: Addie Cordella Hamilton, MD;  Location: [facility] OR;  Service: Orthopedics;  Laterality: Left;   TUBAL LIGATION  1985   Social History   Occupational History   Not on file  Tobacco Use   Smoking status: Never   Smokeless tobacco:  Never  Vaping Use   Vaping status: Never Used  Substance and Sexual Activity   Alcohol use: No   Drug use: No   Sexual activity: Yes    Birth control/protection: None

## 2023-11-30 NOTE — Therapy (Signed)
 OUTPATIENT PHYSICAL THERAPY EVALUATION   Patient Name: Mary Burton MRN: [id number] DOB:1960/10/22, 63 y.o., female Today's Date: 11/30/2023  END OF SESSION:  PT End of Session - 11/30/23 1141     Visit Number 1    Number of Visits 16    Date for Recertification  01/25/24    Authorization Type [facility] [employer] $25 copay    PT Start Time 1137    PT Stop Time 1215    PT Time Calculation (min) 38 min    Activity Tolerance Patient tolerated treatment well    Behavior During Therapy [facility] for tasks assessed/performed          Past Medical History:  Diagnosis Date   Allergy    Arthritis    Environmental allergies    Gout    r hand   Pre-diabetes    Past Surgical History:  Procedure Laterality Date   BICEPT TENODESIS Left 11/14/2023   Procedure: TENODESIS, BICEPS;  Surgeon: Addie Cordella Hamilton, MD;  Location: [facility] OR;  Service: Orthopedics;  Laterality: Left;   COLONOSCOPY  10/12/2011   Pyrtle   OVARIAN CYST REMOVAL     POLYPECTOMY     POSTERIOR LUMBAR FUSION 2 WITH HARDWARE REMOVAL Left 11/14/2023   Procedure: ARTHROSCOPY, SHOULDER WITH DEBRIDEMENT;  Surgeon: Addie Cordella Hamilton, MD;  Location: [facility] OR;  Service: Orthopedics;  Laterality: Left;   SHOULDER OPEN ROTATOR CUFF REPAIR Left 11/14/2023   Procedure: REPAIR, ROTATOR CUFF, OPEN;  Surgeon: Addie Cordella Hamilton, MD;  Location: [facility] OR;  Service: Orthopedics;  Laterality: Left;   TUBAL LIGATION  1985   Patient Active Problem List   Diagnosis Date Noted   Pain in right shoulder 10/05/2023   Idiopathic gout of multiple sites 10/05/2023   Right flank pain 08/25/2023   Pain in left shoulder 08/18/2022   Acute cystitis with hematuria 10/06/2021   Prediabetes 10/06/2021   Obesity (BMI 30.0-34.9) 10/06/2021   Impingement syndrome of right shoulder 07/02/2021   Bilateral foot pain 04/02/2014   Bilateral swelling of feet 06/06/2011   Seasonal allergies 06/06/2011   Bilateral knee pain 06/06/2011    PCP: Lendia Boby CROME,  NP-C  REFERRING PROVIDER: Addie Cordella Hamilton, MD  REFERRING DIAG: [phone] (ICD-10-CM) - S/P arthroscopy of left shoulder  Rationale for Evaluation and Treatment: Rehabilitation  THERAPY DIAG:  Acute pain of left shoulder - Plan: PT plan of care cert/re-cert  Stiffness of left shoulder, not elsewhere classified - Plan: PT plan of care cert/re-cert  Muscle weakness (generalized) - Plan: PT plan of care cert/re-cert  Abnormal posture - Plan: PT plan of care cert/re-cert  Localized edema - Plan: PT plan of care cert/re-cert  ONSET DATE: DOS: 11/14/23   SUBJECTIVE:  SUBJECTIVE STATEMENT: Pt is s/p Lt shoulder scope with Lt shoulder RTC repair, biceps tenodesis on 11/14/23.  PERTINENT HISTORY:  OA, gout, obesity  PAIN:  Are you having pain? Yes: NPRS scale: 5 currently, up to 10, at best 2-3/10 Pain location: Lt shoulder Pain description: throbbing, sore Aggravating factors: worse in AM, working ROM Relieving factors: ice  PRECAUTIONS:  Shoulder and Other: PROM/AAROM only   RED FLAGS: None   WEIGHT BEARING RESTRICTIONS:  No  FALLS:  Has patient fallen in last 6 months? 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  PATIENT SURVEYS:  Patient-Specific Activity Scoring Scheme  0 represents "unable to perform." 10 represents "able to perform at prior level. 0 1 2 3 4 5 6 7 8 9  10 (Date and Score)   Activity Eval     1. Lifting arm in the air 1    2.  Reaching behind back 0    3. Hygiene/Grooming 4   Score 1.67    Total score = sum of the activity scores/number of activities Minimum detectable change (90%CI) for average score = 2 points Minimum detectable change (90%CI) for single activity score = 3 points    COGNITIVE STATUS: Within functional limits for tasks assessed   SENSATION: WFL  POSTURE:  rounded shoulders and forward head  HAND DOMINANCE:  Right  GAIT: 11/30/23 Comments: antalgic gait but largely independent; just had injection   PALPATION: 11/30/23 deferred today  EDEMA: 11/30/23 swelling present in Lt shoulder; did not formally measure   UPPER EXTREMITY ROM:  ROM Right eval Left eval  Shoulder flexion  P: 95  Shoulder abduction  P: 95  Shoulder internal rotation  P: 70 (in 60 deg abd; limited by abdomen)  Shoulder external rotation  P: 25   (Blank rows = not tested)   UPPER EXTREMITY MMT:  11/30/22: Not formally tested due to post-op precautions  MMT Right eval Left eval  Shoulder flexion    Shoulder extension    Shoulder abduction    Shoulder adduction    Shoulder extension    Shoulder internal rotation    Shoulder external rotation    Middle trapezius    Lower trapezius    Elbow flexion    Elbow extension    Wrist flexion    Wrist extension    Wrist ulnar deviation    Wrist radial deviation    Wrist pronation    Wrist supination    Grip strength     (Blank rows = not tested)    TREATMENT:  DATE:  11/30/23 TherEx See HEP - demonstrated with trial reps performed PRN, mod/max cues for comprehension    Self Care Educated on PT POC and clinical findings     PATIENT EDUCATION:  Education details: HEP Person educated: Patient Education method: Programmer, multimedia, Facilities manager, and Handouts Education comprehension: verbalized understanding, returned  demonstration, and needs further education  HOME EXERCISE PROGRAM: Access Code: [email or web address] URL: [email or web address] Date: 11/30/2023 Prepared by: Corean Ku  Exercises - Supine Shoulder Flexion with Dowel  - 3 x daily - 7 x weekly - 1 sets - 10 reps - 3-5 sec hold - Supine Shoulder Press with Dowel  - 3 x daily - 7 x weekly - 1 sets - 10 reps - 3-5 sec hold - Supine Shoulder Abduction AAROM with Dowel  - 3 x daily - 7 x weekly - 3 sets - 10 reps - Supine Shoulder External Rotation in 45 Degrees Abduction AAROM with Dowel  - 3 x daily - 7 x weekly - 3 sets - 10 reps - Seated Shoulder Flexion AAROM with Pulley Behind  - 3 x daily - 7 x weekly - 1 sets - 1 reps - 2-3 min - Seated Shoulder Scaption AAROM with Pulley at Side  - 3 x daily - 7 x weekly - 2-3 min   ASSESSMENT:  CLINICAL IMPRESSION: Patient is a 63 y.o. female who was seen today for physical therapy evaluation and treatment for Lt shoulder RTC repair and biceps tenodesis. She demonstrates decreased strength and ROM, as well as postural abnormalities and expected post op pain and swelling affecting functional mobility.  She will benefit from PT to address deficits listed.     OBJECTIVE IMPAIRMENTS: decreased ROM, decreased strength, hypomobility, increased edema, increased fascial restrictions, increased muscle spasms, impaired flexibility, impaired UE functional use, postural dysfunction, and pain.   ACTIVITY LIMITATIONS: carrying, lifting, sleeping, bed mobility, bathing, toileting, dressing, reach over head, and hygiene/grooming  PARTICIPATION LIMITATIONS: meal prep, cleaning, laundry, driving, shopping, community activity, and occupation  PERSONAL FACTORS: Age, Behavior pattern, Past/current experiences, Time since onset of injury/illness/exacerbation, and 3+ comorbidities: OA, gout, obesity are also affecting patient's functional outcome.   REHAB POTENTIAL: Good  CLINICAL DECISION MAKING:  Evolving/moderate complexity  EVALUATION COMPLEXITY: Moderate   GOALS: Goals reviewed with patient? Yes  SHORT TERM GOALS: Target date: 12/28/2023  Independent with initial HEP Goal status: INITIAL  2.  Lt shoulder PROM improved by 15 deg all limited directions for improved function Goal status: INITIAL   LONG TERM GOALS: Target date: 01/25/2024  Independent with final HEP Goal status: INITIAL  2.  PSFS score improved by 3 points Goal status: INITIAL  3.  Lt shoulder AROM improved to [facility] for improved function and mobility Goal status: INIITAL  4.  Report pain < 3/10 with reaching activities for improved function Goal status: INITIAL  5.  Demonstrate at least 4/5 Lt shoulder strength in order to return to work. Goal status: INITIAL  6.  Demonstrate work simulated activities in preparation for return to work. Goal status: INITIAL     PLAN:  PT FREQUENCY: 1-2x/week (anticipate 2x/wk x 4 weeks, then depending on progress 1-2x/wk x 4 weeks)  PT DURATION: 8 weeks  PLANNED INTERVENTIONS: 97164- PT Re-evaluation, 97750- Physical Performance Testing, 97110-Therapeutic exercises, 97530- Therapeutic activity, 97112- Neuromuscular re-education, 97535- Self Care, [id number]- Manual therapy, [phone]- Aquatic Therapy, [id number]- Electrical stimulation (unattended), 97016- Vasopneumatic device, [id number]- Ionotophoresis 4mg /ml Dexamethasone , 79439 (1-2 muscles), 20561 (3+ muscles)- Dry Needling, Patient/Family education, Taping, Joint mobilization, Joint  manipulation, Cryotherapy, and Moist heat.  PLAN FOR NEXT SESSION: Review HEP, manual for ROM, AA/PROM only at this time  AA/PROM only  NEXT MD VISIT: 12/20/23   Corean JULIANNA Ku, PT, DPT 11/30/23 12:30 PM

## 2023-12-02 DIAGNOSIS — M75122 Complete rotator cuff tear or rupture of left shoulder, not specified as traumatic: Secondary | ICD-10-CM

## 2023-12-02 DIAGNOSIS — M7522 Bicipital tendinitis, left shoulder: Secondary | ICD-10-CM

## 2023-12-02 DIAGNOSIS — S43432A Superior glenoid labrum lesion of left shoulder, initial encounter: Secondary | ICD-10-CM

## 2023-12-02 DIAGNOSIS — M65912 Unspecified synovitis and tenosynovitis, left shoulder: Secondary | ICD-10-CM

## 2023-12-03 ENCOUNTER — Other Ambulatory Visit (HOSPITAL_COMMUNITY): Payer: Self-pay

## 2023-12-05 ENCOUNTER — Ambulatory Visit: Admitting: Physical Therapy

## 2023-12-05 ENCOUNTER — Encounter: Payer: Self-pay | Admitting: Physical Therapy

## 2023-12-05 DIAGNOSIS — M6281 Muscle weakness (generalized): Secondary | ICD-10-CM | POA: Diagnosis not present

## 2023-12-05 DIAGNOSIS — M25512 Pain in left shoulder: Secondary | ICD-10-CM

## 2023-12-05 DIAGNOSIS — M25612 Stiffness of left shoulder, not elsewhere classified: Secondary | ICD-10-CM

## 2023-12-05 DIAGNOSIS — R293 Abnormal posture: Secondary | ICD-10-CM

## 2023-12-05 DIAGNOSIS — R600 Localized edema: Secondary | ICD-10-CM

## 2023-12-05 NOTE — Therapy (Signed)
 OUTPATIENT PHYSICAL THERAPY TREATMENT   Patient Name: Mary Burton MRN: [id number] DOB:05/01/1960, 63 y.o., female Today's Date: 12/05/2023  END OF SESSION:  PT End of Session - 12/05/23 1513     Visit Number 2    Number of Visits 16    Date for Recertification  01/25/24    Authorization Type [facility] [employer] $25 copay    PT Start Time 1515    PT Stop Time 1610    PT Time Calculation (min) 55 min    Activity Tolerance Patient tolerated treatment well    Behavior During Therapy [facility] for tasks assessed/performed           Past Medical History:  Diagnosis Date   Allergy    Arthritis    Environmental allergies    Gout    r hand   Pre-diabetes    Past Surgical History:  Procedure Laterality Date   BICEPT TENODESIS Left 11/14/2023   Procedure: TENODESIS, BICEPS;  Surgeon: Addie Cordella Hamilton, MD;  Location: [facility] OR;  Service: Orthopedics;  Laterality: Left;   COLONOSCOPY  10/12/2011   Pyrtle   OVARIAN CYST REMOVAL     POLYPECTOMY     POSTERIOR LUMBAR FUSION 2 WITH HARDWARE REMOVAL Left 11/14/2023   Procedure: ARTHROSCOPY, SHOULDER WITH DEBRIDEMENT;  Surgeon: Addie Cordella Hamilton, MD;  Location: [facility] OR;  Service: Orthopedics;  Laterality: Left;   SHOULDER OPEN ROTATOR CUFF REPAIR Left 11/14/2023   Procedure: REPAIR, ROTATOR CUFF, OPEN;  Surgeon: Addie Cordella Hamilton, MD;  Location: [facility] OR;  Service: Orthopedics;  Laterality: Left;   TUBAL LIGATION  1985   Patient Active Problem List   Diagnosis Date Noted   Synovitis of left shoulder 12/02/2023   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 12/02/2023   Biceps tendonitis, left 12/02/2023   Complete tear of left rotator cuff 12/02/2023   Pain in right shoulder 10/05/2023   Idiopathic gout of multiple sites 10/05/2023   Right flank pain 08/25/2023   Pain in left shoulder 08/18/2022   Acute cystitis with hematuria 10/06/2021   Prediabetes 10/06/2021   Obesity (BMI 30.0-34.9) 10/06/2021    Impingement syndrome of right shoulder 07/02/2021   Bilateral foot pain 04/02/2014   Bilateral swelling of feet 06/06/2011   Seasonal allergies 06/06/2011   Bilateral knee pain 06/06/2011    PCP: Lendia Boby CROME, NP-C  REFERRING PROVIDER: Addie Cordella Hamilton, MD  REFERRING DIAG: [phone] (ICD-10-CM) - S/P arthroscopy of left shoulder  Rationale for Evaluation and Treatment: Rehabilitation  THERAPY DIAG:  Acute pain of left shoulder  Stiffness of left shoulder, not elsewhere classified  Muscle weakness (generalized)  Abnormal posture  Localized edema  ONSET DATE: DOS: 11/14/23   SUBJECTIVE:  SUBJECTIVE STATEMENT: She has been doing her exercises.    PERTINENT HISTORY:  Lt shoulder scope with Lt shoulder RTC repair / biceps tenodesis on 11/14/23, OA, gout, obesity  PAIN:  Are you having pain?  Yes: NPRS scale: 3/10 currently, since last PT 2/10 - 6/10 Pain location: Lt shoulder Pain description: throbbing, sore Aggravating factors: worse in AM, working ROM Relieving factors: ice  PRECAUTIONS:  Shoulder and Other: PROM/AAROM only   RED FLAGS: None   WEIGHT BEARING RESTRICTIONS:  No  FALLS:  Has patient fallen in last 6 months? 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  PATIENT SURVEYS:  Patient-Specific Activity Scoring Scheme  0 represents "unable to perform." 10 represents "able to perform at prior level. 0 1 2 3 4 5 6 7 8 9  10 (Date and  Score)   Activity Eval     1. Lifting arm in the air 1    2. Reaching behind back 0    3. Hygiene/Grooming 4   Score 1.67    Total score = sum of the activity scores/number of activities Minimum detectable change (90%CI) for average score = 2 points Minimum detectable change (90%CI) for single activity score = 3 points    COGNITIVE STATUS: Within functional limits for tasks assessed   SENSATION: WFL  POSTURE:  rounded shoulders and forward head  HAND DOMINANCE:  Right  GAIT: 11/30/23 Comments: antalgic gait but largely independent; just had injection   PALPATION: 11/30/23 deferred today  EDEMA: 11/30/23 swelling present in Lt shoulder; did not formally measure   UPPER EXTREMITY ROM:  ROM Right eval Left eval  Shoulder flexion  P: 95  Shoulder abduction  P: 95  Shoulder internal rotation  P: 70 (in 60 deg abd; limited by abdomen)  Shoulder external rotation  P: 25   (Blank rows = not tested)   UPPER EXTREMITY MMT:  11/30/22: Not formally tested due to post-op precautions  MMT Right eval Left eval  Shoulder flexion    Shoulder extension    Shoulder abduction    Shoulder adduction    Shoulder extension    Shoulder internal rotation    Shoulder external rotation    Middle trapezius    Lower trapezius    Elbow flexion    Elbow extension    Wrist flexion    Wrist extension    Wrist ulnar deviation    Wrist radial deviation    Wrist pronation    Wrist supination    Grip strength     (Blank rows = not tested)    TREATMENT:  DATE:  12/05/2023 Therapeutic Exercise: Pulley 2 min each flexion & scaption with PT cues on technique.   Pendulum flex /ext, abd/add and circles CW & CCW 10 reps each  Supine cervical retraction 5 sec hold 10 reps Supine scapula retraction 5 sec hold 10 reps AAROM Supine chest press dowel with  Serratus punch at end 10 reps AAROM Supine dowel flexion with deep breath end range stretch 10 reps AAROM Supine dowel abduction with deep breath end range stretch 10 reps  Manual Therapy PROM supine flexion & abduction with gentle overpressure.    Self-care PT demo & verbal cues on positioning with arm supported in sitting, supine & right side lying. Pt verbalized understanding.    Vaso left shoulder 34* medium compression 10 minutes with elbow supported in sitting.  TREATMENT:                                                                                                                              DATE:  11/30/23 TherEx See HEP - demonstrated with trial reps performed PRN, mod/max cues for comprehension    Self Care Educated on PT POC and clinical findings    HOME EXERCISE PROGRAM: Access Code: [email or web address] URL: [email or web address] Date: 11/30/2023 Prepared by: Corean Ku  Exercises - Supine Shoulder Flexion with Dowel  - 3 x daily - 7 x weekly - 1 sets - 10 reps - 3-5 sec hold - Supine Shoulder Press with Dowel  - 3 x daily - 7 x weekly - 1 sets - 10 reps - 3-5 sec hold - Supine Shoulder Abduction AAROM with Dowel  - 3 x daily - 7 x weekly - 3 sets - 10 reps - Supine Shoulder External Rotation in 45 Degrees Abduction AAROM with Dowel  - 3 x daily - 7 x weekly - 3 sets - 10 reps - Seated Shoulder Flexion AAROM with Pulley Behind  - 3 x daily - 7 x weekly - 1 sets - 1 reps - 2-3 min - Seated Shoulder Scaption AAROM with Pulley at Side  - 3 x daily - 7 x weekly - 2-3 min   ASSESSMENT:  CLINICAL IMPRESSION: Patient has a better understanding of HEP including cervical retraction and scapular retraction.  Patient continues to have pain limiting range.  Patient continues to benefit from skilled PT to improve function of left shoulder.  OBJECTIVE IMPAIRMENTS: decreased ROM, decreased strength, hypomobility, increased edema, increased fascial restrictions,  increased muscle spasms, impaired flexibility, impaired UE functional use, postural dysfunction, and pain.   ACTIVITY LIMITATIONS: carrying, lifting, sleeping, bed mobility, bathing, toileting, dressing, reach over head, and hygiene/grooming  PARTICIPATION LIMITATIONS: meal prep, cleaning, laundry, driving, shopping, community activity, and occupation  PERSONAL FACTORS: Age, Behavior pattern, Past/current experiences, Time since onset of injury/illness/exacerbation, and 3+ comorbidities: OA, gout, obesity are also affecting patient's functional outcome.   REHAB POTENTIAL: Good  CLINICAL DECISION MAKING: Evolving/moderate complexity  EVALUATION COMPLEXITY: Moderate   GOALS: Goals reviewed  with patient? Yes  SHORT TERM GOALS: Target date: 12/28/2023  Independent with initial HEP Goal status: Ongoing   12/05/2023  2.  Lt shoulder PROM improved by 15 deg all limited directions for improved function Goal status: Ongoing   12/05/2023   LONG TERM GOALS: Target date: 01/25/2024  Independent with final HEP Goal status: Ongoing   12/05/2023  2.  PSFS score improved by 3 points Goal status: Ongoing   12/05/2023  3.  Lt shoulder AROM improved to [facility] for improved function and mobility Goal status: Ongoing   12/05/2023  4.  Report pain < 3/10 with reaching activities for improved function Goal status:  Ongoing   12/05/2023  5.  Demonstrate at least 4/5 Lt shoulder strength in order to return to work. Goal status: Ongoing   12/05/2023  6.  Demonstrate work simulated activities in preparation for return to work. Goal status: Ongoing   12/05/2023   PLAN:  PT FREQUENCY: 1-2x/week (anticipate 2x/wk x 4 weeks, then depending on progress 1-2x/wk x 4 weeks)  PT DURATION: 8 weeks  PLANNED INTERVENTIONS: 97164- PT Re-evaluation, 97750- Physical Performance Testing, 97110-Therapeutic exercises, 97530- Therapeutic activity, 97112- Neuromuscular re-education, 97535- Self Care, 02859-  Manual therapy, [phone]- Aquatic Therapy, G0283- Electrical stimulation (unattended), 97016- Vasopneumatic device, [id number]- Ionotophoresis 4mg /ml Dexamethasone , 79439 (1-2 muscles), 20561 (3+ muscles)- Dry Needling, Patient/Family education, Taping, Joint mobilization, Joint manipulation, Cryotherapy, and Moist heat.  PLAN FOR NEXT SESSION:   Update HEP as appropriate per protocol, manual for ROM, AA/PROM only at this time  AA/PROM only  NEXT MD VISIT: 12/20/23   Grayce Spatz, PT, DPT 12/05/2023, 4:24 PM

## 2023-12-07 ENCOUNTER — Ambulatory Visit: Admitting: Physical Therapy

## 2023-12-07 ENCOUNTER — Encounter: Payer: Self-pay | Admitting: Physical Therapy

## 2023-12-07 DIAGNOSIS — R600 Localized edema: Secondary | ICD-10-CM | POA: Diagnosis not present

## 2023-12-07 DIAGNOSIS — R293 Abnormal posture: Secondary | ICD-10-CM | POA: Diagnosis not present

## 2023-12-07 DIAGNOSIS — M25512 Pain in left shoulder: Secondary | ICD-10-CM | POA: Diagnosis not present

## 2023-12-07 DIAGNOSIS — M6281 Muscle weakness (generalized): Secondary | ICD-10-CM

## 2023-12-07 DIAGNOSIS — M25612 Stiffness of left shoulder, not elsewhere classified: Secondary | ICD-10-CM | POA: Diagnosis not present

## 2023-12-07 NOTE — Therapy (Signed)
 OUTPATIENT PHYSICAL THERAPY TREATMENT   Patient Name: Mary Burton MRN: [id number] DOB:August 06, 1960, 63 y.o., female Today's Date: 12/07/2023  END OF SESSION:  PT End of Session - 12/07/23 1140     Visit Number 3    Number of Visits 16    Date for Recertification  01/25/24    Authorization Type [facility] [employer] $25 copay    PT Start Time 1138    PT Stop Time 1220    PT Time Calculation (min) 42 min    Activity Tolerance Patient tolerated treatment well    Behavior During Therapy [facility] for tasks assessed/performed            Past Medical History:  Diagnosis Date   Allergy    Arthritis    Environmental allergies    Gout    r hand   Pre-diabetes    Past Surgical History:  Procedure Laterality Date   BICEPT TENODESIS Left 11/14/2023   Procedure: TENODESIS, BICEPS;  Surgeon: Addie Cordella Hamilton, MD;  Location: [facility] OR;  Service: Orthopedics;  Laterality: Left;   COLONOSCOPY  10/12/2011   Pyrtle   OVARIAN CYST REMOVAL     POLYPECTOMY     POSTERIOR LUMBAR FUSION 2 WITH HARDWARE REMOVAL Left 11/14/2023   Procedure: ARTHROSCOPY, SHOULDER WITH DEBRIDEMENT;  Surgeon: Addie Cordella Hamilton, MD;  Location: [facility] OR;  Service: Orthopedics;  Laterality: Left;   SHOULDER OPEN ROTATOR CUFF REPAIR Left 11/14/2023   Procedure: REPAIR, ROTATOR CUFF, OPEN;  Surgeon: Addie Cordella Hamilton, MD;  Location: [facility] OR;  Service: Orthopedics;  Laterality: Left;   TUBAL LIGATION  1985   Patient Active Problem List   Diagnosis Date Noted   Synovitis of left shoulder 12/02/2023   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 12/02/2023   Biceps tendonitis, left 12/02/2023   Complete tear of left rotator cuff 12/02/2023   Pain in right shoulder 10/05/2023   Idiopathic gout of multiple sites 10/05/2023   Right flank pain 08/25/2023   Pain in left shoulder 08/18/2022   Acute cystitis with hematuria 10/06/2021   Prediabetes 10/06/2021   Obesity (BMI 30.0-34.9) 10/06/2021    Impingement syndrome of right shoulder 07/02/2021   Bilateral foot pain 04/02/2014   Bilateral swelling of feet 06/06/2011   Seasonal allergies 06/06/2011   Bilateral knee pain 06/06/2011    PCP: Lendia Boby CROME, NP-C  REFERRING PROVIDER: Addie Cordella Hamilton, MD  REFERRING DIAG: [phone] (ICD-10-CM) - S/P arthroscopy of left shoulder  Rationale for Evaluation and Treatment: Rehabilitation  THERAPY DIAG:  Acute pain of left shoulder  Stiffness of left shoulder, not elsewhere classified  Muscle weakness (generalized)  Abnormal posture  Localized edema  ONSET DATE: DOS: 11/14/23   SUBJECTIVE:  SUBJECTIVE STATEMENT: Working on her exercises at home  PERTINENT HISTORY:  Lt shoulder scope with Lt shoulder RTC repair / biceps tenodesis on 11/14/23, OA, gout, obesity  PAIN:  Are you having pain?  Yes: NPRS scale: 0/10 currently, since last PT 0-3/10 Pain location: Lt shoulder Pain description: throbbing, sore Aggravating factors: worse in AM, working ROM Relieving factors: ice  PRECAUTIONS:  Shoulder and Other: PROM/AAROM only   RED FLAGS: None   WEIGHT BEARING RESTRICTIONS:  No  FALLS:  Has patient fallen in last 6 months? 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  PATIENT SURVEYS:  Patient-Specific Activity Scoring Scheme  0 represents "unable to perform." 10 represents "able to perform at prior level. 0 1 2 3 4 5 6 7 8 9  10 (Date and  Score)   Activity Eval     1. Lifting arm in the air 1    2. Reaching behind back 0    3. Hygiene/Grooming 4   Score 1.67    Total score = sum of the activity scores/number of activities Minimum detectable change (90%CI) for average score = 2 points Minimum detectable change (90%CI) for single activity score = 3 points    COGNITIVE STATUS: Within functional limits for tasks assessed   SENSATION: WFL  POSTURE:  rounded shoulders and forward head  HAND DOMINANCE:  Right  GAIT: 11/30/23 Comments: antalgic gait but largely independent; just had injection   PALPATION: 11/30/23 deferred today  EDEMA: 11/30/23 swelling present in Lt shoulder; did not formally measure   UPPER EXTREMITY ROM:  ROM Right eval Left eval Left 12/07/23  Shoulder flexion  P: 95 AA: 150 (Supine)  Shoulder abduction  P: 95 P: 148 (supine)  Shoulder internal rotation  P: 70 (in 60 deg abd; limited by abdomen)   Shoulder external rotation  P: 25 P: 68 (in 40 deg abduction, supine)   (Blank rows = not tested)   UPPER EXTREMITY MMT:  11/30/22: Not formally tested due to post-op precautions  MMT Right eval Left eval  Shoulder flexion    Shoulder extension    Shoulder abduction    Shoulder adduction    Shoulder extension    Shoulder internal rotation    Shoulder external rotation    Middle trapezius    Lower trapezius    Elbow flexion    Elbow extension    Wrist flexion    Wrist extension    Wrist ulnar deviation    Wrist radial deviation    Wrist pronation    Wrist supination    Grip strength     (Blank rows = not tested)    TREATMENT:  DATE:  12/07/23 TherAct Pulleys flexion and abduction x 3 min each direction AAROM Supine chest press dowel with Serratus punch at end 2x10 reps AAROM Supine dowel flexion with deep breath end range stretch 2x10  reps Seated chest press 2x10 with 1# bar - cues to decrease shrug Wall ladder flexion x 10 reps - cues to decrease shrug  TherEx ROM measurements - see above for details      12/05/2023 Therapeutic Exercise: Pulley 2 min each flexion & scaption with PT cues on technique.   Pendulum flex /ext, abd/add and circles CW & CCW 10 reps each  Supine cervical retraction 5 sec hold 10 reps Supine scapula retraction 5 sec hold 10 reps AAROM Supine chest press dowel with Serratus punch at end 10 reps AAROM Supine dowel flexion with deep breath end range stretch 10 reps AAROM Supine dowel abduction with deep breath end range stretch 10 reps  Manual Therapy PROM supine flexion & abduction with gentle overpressure.    Self-care PT demo & verbal cues on positioning with arm supported in sitting, supine & right side lying. Pt verbalized understanding.    Vaso left shoulder 34* medium compression 10 minutes with elbow supported in sitting.  TREATMENT:                                                                                                                              DATE:  11/30/23 TherEx See HEP - demonstrated with trial reps performed PRN, mod/max cues for comprehension    Self Care Educated on PT POC and clinical findings    HOME EXERCISE PROGRAM: Access Code: [email or web address] URL: [email or web address] Date: 11/30/2023 Prepared by: Corean Ku  Exercises - Supine Shoulder Flexion with Dowel  - 3 x daily - 7 x weekly - 1 sets - 10 reps - 3-5 sec hold - Supine Shoulder Press with Dowel  - 3 x daily - 7 x weekly - 1 sets - 10 reps - 3-5 sec hold - Supine Shoulder Abduction AAROM with Dowel  - 3 x daily - 7 x weekly - 3 sets - 10 reps - Supine Shoulder External Rotation in 45 Degrees Abduction AAROM with Dowel  - 3 x daily - 7 x weekly - 3 sets - 10 reps - Seated Shoulder Flexion AAROM with Pulley Behind  - 3 x daily - 7 x weekly - 1 sets - 1 reps - 2-3 min -  Seated Shoulder Scaption AAROM with Pulley at Side  - 3 x daily - 7 x weekly - 2-3 min   ASSESSMENT:  CLINICAL IMPRESSION: Pt tolerated session well today and initiated seated exercises today with some mild shrug noted.  Continue skilled PT at this time, good improvement in ROM noted today.   OBJECTIVE IMPAIRMENTS: decreased ROM, decreased strength, hypomobility, increased edema, increased fascial restrictions, increased muscle spasms, impaired flexibility, impaired UE functional use, postural dysfunction, and pain.   ACTIVITY LIMITATIONS: carrying, lifting, sleeping, bed mobility,  bathing, toileting, dressing, reach over head, and hygiene/grooming  PARTICIPATION LIMITATIONS: meal prep, cleaning, laundry, driving, shopping, community activity, and occupation  PERSONAL FACTORS: Age, Behavior pattern, Past/current experiences, Time since onset of injury/illness/exacerbation, and 3+ comorbidities: OA, gout, obesity are also affecting patient's functional outcome.   REHAB POTENTIAL: Good  CLINICAL DECISION MAKING: Evolving/moderate complexity  EVALUATION COMPLEXITY: Moderate   GOALS: Goals reviewed with patient? Yes  SHORT TERM GOALS: Target date: 12/28/2023  Independent with initial HEP Goal status: Ongoing   12/05/2023  2.  Lt shoulder PROM improved by 15 deg all limited directions for improved function Goal status: MET 12/07/23   LONG TERM GOALS: Target date: 01/25/2024  Independent with final HEP Goal status: Ongoing   12/05/2023  2.  PSFS score improved by 3 points Goal status: Ongoing   12/05/2023  3.  Lt shoulder AROM improved to [facility] for improved function and mobility Goal status: Ongoing   12/05/2023  4.  Report pain < 3/10 with reaching activities for improved function Goal status:  Ongoing   12/05/2023  5.  Demonstrate at least 4/5 Lt shoulder strength in order to return to work. Goal status: Ongoing   12/05/2023  6.  Demonstrate work simulated activities in  preparation for return to work. Goal status: Ongoing   12/05/2023   PLAN:  PT FREQUENCY: 1-2x/week (anticipate 2x/wk x 4 weeks, then depending on progress 1-2x/wk x 4 weeks)  PT DURATION: 8 weeks  PLANNED INTERVENTIONS: 97164- PT Re-evaluation, 97750- Physical Performance Testing, 97110-Therapeutic exercises, 97530- Therapeutic activity, 97112- Neuromuscular re-education, 97535- Self Care, 02859- Manual therapy, [phone]- Aquatic Therapy, G0283- Electrical stimulation (unattended), 97016- Vasopneumatic device, [id number]- Ionotophoresis 4mg /ml Dexamethasone , 79439 (1-2 muscles), 20561 (3+ muscles)- Dry Needling, Patient/Family education, Taping, Joint mobilization, Joint manipulation, Cryotherapy, and Moist heat.  PLAN FOR NEXT SESSION:    Update HEP as appropriate per protocol (progress to sitting/standing if not too sore), manual for ROM, AA/PROM only at this time  AA/PROM only  NEXT MD VISIT: 12/20/23   Corean JULIANNA Ku, PT, DPT 12/07/2023, 12:51 PM

## 2023-12-11 ENCOUNTER — Encounter: Payer: Self-pay | Admitting: Radiology

## 2023-12-12 ENCOUNTER — Encounter: Payer: Self-pay | Admitting: Physical Therapy

## 2023-12-12 ENCOUNTER — Ambulatory Visit: Admitting: Physical Therapy

## 2023-12-12 DIAGNOSIS — M25512 Pain in left shoulder: Secondary | ICD-10-CM | POA: Diagnosis not present

## 2023-12-12 DIAGNOSIS — M25612 Stiffness of left shoulder, not elsewhere classified: Secondary | ICD-10-CM

## 2023-12-12 DIAGNOSIS — M6281 Muscle weakness (generalized): Secondary | ICD-10-CM

## 2023-12-12 DIAGNOSIS — R293 Abnormal posture: Secondary | ICD-10-CM

## 2023-12-12 DIAGNOSIS — R600 Localized edema: Secondary | ICD-10-CM

## 2023-12-12 NOTE — Therapy (Signed)
 OUTPATIENT PHYSICAL THERAPY TREATMENT   Patient Name: Mary Burton MRN: [id number] DOB:[birth year], 63 y.o., female Today's Date: 12/12/2023  END OF SESSION:  PT End of Session - 12/12/23 1057     Visit Number 4    Number of Visits 16    Date for Recertification  01/25/24    Authorization Type [facility] [employer] $25 copay    PT Start Time 1058    PT Stop Time 1144    PT Time Calculation (min) 46 min    Activity Tolerance Patient tolerated treatment well    Behavior During Therapy [facility] for tasks assessed/performed             Past Medical History:  Diagnosis Date   Allergy    Arthritis    Environmental allergies    Gout    r hand   Pre-diabetes    Past Surgical History:  Procedure Laterality Date   BICEPT TENODESIS Left 11/14/2023   Procedure: TENODESIS, BICEPS;  Surgeon: Addie Cordella Hamilton, MD;  Location: [facility] OR;  Service: Orthopedics;  Laterality: Left;   COLONOSCOPY  10/12/2011   Pyrtle   OVARIAN CYST REMOVAL     POLYPECTOMY     POSTERIOR LUMBAR FUSION 2 WITH HARDWARE REMOVAL Left 11/14/2023   Procedure: ARTHROSCOPY, SHOULDER WITH DEBRIDEMENT;  Surgeon: Addie Cordella Hamilton, MD;  Location: [facility] OR;  Service: Orthopedics;  Laterality: Left;   SHOULDER OPEN ROTATOR CUFF REPAIR Left 11/14/2023   Procedure: REPAIR, ROTATOR CUFF, OPEN;  Surgeon: Addie Cordella Hamilton, MD;  Location: [facility] OR;  Service: Orthopedics;  Laterality: Left;   TUBAL LIGATION  1985   Patient Active Problem List   Diagnosis Date Noted   Synovitis of left shoulder 12/02/2023   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 12/02/2023   Biceps tendonitis, left 12/02/2023   Complete tear of left rotator cuff 12/02/2023   Pain in right shoulder 10/05/2023   Idiopathic gout of multiple sites 10/05/2023   Right flank pain 08/25/2023   Pain in left shoulder 08/18/2022   Acute cystitis with hematuria 10/06/2021   Prediabetes 10/06/2021   Obesity (BMI 30.0-34.9) 10/06/2021    Impingement syndrome of right shoulder 07/02/2021   Bilateral foot pain 04/02/2014   Bilateral swelling of feet 06/06/2011   Seasonal allergies 06/06/2011   Bilateral knee pain 06/06/2011    PCP: Lendia Boby CROME, NP-C  REFERRING PROVIDER: Addie Cordella Hamilton, MD  REFERRING DIAG: [phone] (ICD-10-CM) - S/P arthroscopy of left shoulder  Rationale for Evaluation and Treatment: Rehabilitation  THERAPY DIAG:  Acute pain of left shoulder  Stiffness of left shoulder, not elsewhere classified  Muscle weakness (generalized)  Abnormal posture  Localized edema  ONSET DATE: DOS: 11/14/23   SUBJECTIVE:  SUBJECTIVE STATEMENT: She has been doing her exercises.  She has been sleeping on her back in bed.    PERTINENT HISTORY:  Lt shoulder scope with Lt shoulder RTC repair / biceps tenodesis on 11/14/23, OA, gout, obesity  PAIN:  Are you having pain?  Yes: NPRS scale:   0/10 currently, since last PT 0-3/10 Pain location: Lt shoulder Pain description: throbbing, sore Aggravating factors: worse in AM, working ROM Relieving factors: ice  PRECAUTIONS:  Shoulder and Other: PROM/AAROM only   RED FLAGS: None   WEIGHT BEARING RESTRICTIONS:  No  FALLS:  Has patient fallen in last 6 months? 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  PATIENT SURVEYS:  Patient-Specific Activity Scoring Scheme  0 represents "unable to perform." 10 represents "able to perform at prior  level. 0 1 2 3 4 5 6 7 8 9  10 (Date and Score)   Activity Eval     1. Lifting arm in the air 1    2. Reaching behind back 0    3. Hygiene/Grooming 4   Score 1.67    Total score = sum of the activity scores/number of activities Minimum detectable change (90%CI) for average score = 2 points Minimum detectable change (90%CI) for single activity score = 3 points    COGNITIVE STATUS: Within functional limits for tasks assessed   SENSATION: WFL  POSTURE:  rounded shoulders and forward head  HAND DOMINANCE:  Right  GAIT: 11/30/23 Comments: antalgic gait but largely independent; just had injection   PALPATION: 11/30/23 deferred today  EDEMA: 11/30/23 swelling present in Lt shoulder; did not formally measure   UPPER EXTREMITY ROM:  ROM Right eval Left eval Left 12/07/23  Shoulder flexion  P: 95 AA: 150 (Supine)  Shoulder abduction  P: 95 P: 148 (supine)  Shoulder internal rotation  P: 70 (in 60 deg abd; limited by abdomen)   Shoulder external rotation  P: 25 P: 68 (in 40 deg abduction, supine)   (Blank rows = not tested)   UPPER EXTREMITY MMT:  11/30/22: Not formally tested due to post-op precautions  MMT Right eval Left eval  Shoulder flexion    Shoulder extension    Shoulder abduction    Shoulder adduction    Shoulder extension    Shoulder internal rotation    Shoulder external rotation    Middle trapezius    Lower trapezius    Elbow flexion    Elbow extension    Wrist flexion    Wrist extension    Wrist ulnar deviation    Wrist radial deviation    Wrist pronation    Wrist supination    Grip strength     (Blank rows = not tested)    TREATMENT:  DATE:  12/12/23 Therapeutic Activities Pulleys flexion and abduction x 3 min each direction LUE flexion 10 reps 2 sets and scaption 10 reps 1 set AAROM concentric and AROM  eccentric counter to shoulder ht shelf 10 reps, then counter to shoulder ht shelf to eye level shelf.  PT tactile & verbal cues on scapula set prior to moving LUE Scapula retraction green theraband 10 reps with PT tactile cues.    Therapeutic Exercise:  Isometric with humerus in line with trunk: 3 sec hold 10 reps ea flexion, adduction, abduction, extension PT updated HEP with HO for flexion & scaption to shelves and isometrics above.  Pt verbaized understanding.   Self-care: PT demo & verbal cues on ice massage.  Pt verbalized and return demo understanding.      TREATMENT:                                                                                                                              DATE:  12/07/23 TherAct Pulleys flexion and abduction x 3 min each direction AAROM Supine chest press dowel with Serratus punch at end 2x10 reps AAROM Supine dowel flexion with deep breath end range stretch 2x10 reps Seated chest press 2x10 with 1# bar - cues to decrease shrug Wall ladder flexion x 10 reps - cues to decrease shrug  TherEx ROM measurements - see above for details      12/05/2023 Therapeutic Exercise: Pulley 2 min each flexion & scaption with PT cues on technique.   Pendulum flex /ext, abd/add and circles CW & CCW 10 reps each  Supine cervical retraction 5 sec hold 10 reps Supine scapula retraction 5 sec hold 10 reps AAROM Supine chest press dowel with Serratus punch at end 10 reps AAROM Supine dowel flexion with deep breath end range stretch 10 reps AAROM Supine dowel abduction with deep breath end range stretch 10 reps  Manual Therapy PROM supine flexion & abduction with gentle overpressure.    Self-care PT demo & verbal cues on positioning with arm supported in sitting, supine & right side lying. Pt verbalized understanding.    Vaso left shoulder 34* medium compression 10 minutes with elbow supported in sitting.     HOME EXERCISE PROGRAM: Access Code:  [email or web address] URL: [email or web address] Date: 12/12/2023 Prepared by: Grayce Spatz  Exercises - Supine Chin Tuck  - 3 x daily - 7 x weekly - 1 sets - 10 reps - 5 seconds hold - Supine Scapular Retraction  - 3 x daily - 7 x weekly - 1 sets - 10 reps - 5 seconds hold - Supine Shoulder Press with Dowel  - 3 x daily - 7 x weekly - 1 sets - 10 reps - 3-5 sec hold - Supine Shoulder Flexion with Dowel  - 3 x daily - 7 x weekly - 1 sets - 10 reps - 3-5 sec hold - Supine Shoulder Abduction AAROM with Dowel  - 3 x daily - 7  x weekly - 3 sets - 10 reps - Supine Shoulder External Rotation in 45 Degrees Abduction AAROM with Dowel  - 3 x daily - 7 x weekly - 3 sets - 10 reps - Seated Shoulder Flexion AAROM with Pulley Behind  - 3 x daily - 7 x weekly - 1 sets - 1 reps - 2-3 min - Seated Shoulder Scaption AAROM with Pulley at Side  - 3 x daily - 7 x weekly - 2-3 min - AAROM/AROM Shoulder Flexion at Cabinet  - 1-2 x daily - 7 x weekly - 1-2 sets - 10 reps - 5 seconds hold - AAROM/AROM Shoulder Scaption at Cabinet  - 1-2 x daily - 5 x weekly - 1-2 sets - 10 reps - 5 seconds hold - Isometric Shoulder Flexion at Wall  - 1-2 x daily - 7 x weekly - 2 sets - 10 reps - 5 seconds hold - Isometric Shoulder Extension at Wall  - 1-2 x daily - 7 x weekly - 2 sets - 10 reps - 5 seconds hold - Isometric Shoulder Abduction at Wall  - 1-2 x daily - 7 x weekly - 2 sets - 10 reps - 5 seconds hold - Isometric Shoulder Adduction  - 1-2 x daily - 7 x weekly - 2 sets - 10 reps - 5 seconds hold   ASSESSMENT:  CLINICAL IMPRESSION: PT progressed shoulder activities to include AA to shelf and AROM eccentric.  With PT cues on setting scapula she was able to perform shoulder motions without popping or pain.  PT instructed in ice massage which appears will help control pain a little better.  Pt continues to benefit from skilled PT.   OBJECTIVE IMPAIRMENTS: decreased ROM, decreased strength, hypomobility, increased edema,  increased fascial restrictions, increased muscle spasms, impaired flexibility, impaired UE functional use, postural dysfunction, and pain.   ACTIVITY LIMITATIONS: carrying, lifting, sleeping, bed mobility, bathing, toileting, dressing, reach over head, and hygiene/grooming  PARTICIPATION LIMITATIONS: meal prep, cleaning, laundry, driving, shopping, community activity, and occupation  PERSONAL FACTORS: Age, Behavior pattern, Past/current experiences, Time since onset of injury/illness/exacerbation, and 3+ comorbidities: OA, gout, obesity are also affecting patient's functional outcome.   REHAB POTENTIAL: Good  CLINICAL DECISION MAKING: Evolving/moderate complexity  EVALUATION COMPLEXITY: Moderate   GOALS: Goals reviewed with patient? Yes  SHORT TERM GOALS: Target date: 12/28/2023  Independent with initial HEP Goal status: Ongoing     12/12/2023  2.  Lt shoulder PROM improved by 15 deg all limited directions for improved function Goal status: MET 12/07/23   LONG TERM GOALS: Target date: 01/25/2024  Independent with final HEP Goal status: Ongoing      12/12/2023  2.  PSFS score improved by 3 points Goal status: Ongoing      12/12/2023  3.  Lt shoulder AROM improved to [facility] for improved function and mobility Goal status: Ongoing     12/12/2023  4.  Report pain < 3/10 with reaching activities for improved function Goal status:  Ongoing     12/12/2023  5.  Demonstrate at least 4/5 Lt shoulder strength in order to return to work. Goal status: Ongoing     12/12/2023  6.  Demonstrate work simulated activities in preparation for return to work. Goal status: Ongoing   12/12/2023   PLAN:  PT FREQUENCY: 1-2x/week (anticipate 2x/wk x 4 weeks, then depending on progress 1-2x/wk x 4 weeks)  PT DURATION: 8 weeks  PLANNED INTERVENTIONS: 97164- PT Re-evaluation, 97750- Physical Performance Testing, 97110-Therapeutic exercises, 97530-  Therapeutic activity, [id number]- Neuromuscular re-education,  [phone]- Self Care, 02859- Manual therapy, [phone]- Aquatic Therapy, [phone]- Electrical stimulation (unattended), 97016- Vasopneumatic device, [id number]- Ionotophoresis 4mg /ml Dexamethasone , 20560 (1-2 muscles), 20561 (3+ muscles)- Dry Needling, Patient/Family education, Taping, Joint mobilization, Joint manipulation, Cryotherapy, and Moist heat.  PLAN FOR NEXT SESSION:  check updated HEP, progress to sitting/standing if not too sore, manual for ROM, AA/PROM only at this time  AA/PROM only  NEXT MD VISIT: 12/20/23   Grayce Spatz, PT, DPT 12/12/2023, 11:52 AM

## 2023-12-14 ENCOUNTER — Encounter: Payer: Self-pay | Admitting: Physical Therapy

## 2023-12-14 ENCOUNTER — Ambulatory Visit: Admitting: Physical Therapy

## 2023-12-14 DIAGNOSIS — M25512 Pain in left shoulder: Secondary | ICD-10-CM

## 2023-12-14 DIAGNOSIS — M25612 Stiffness of left shoulder, not elsewhere classified: Secondary | ICD-10-CM | POA: Diagnosis not present

## 2023-12-14 DIAGNOSIS — M6281 Muscle weakness (generalized): Secondary | ICD-10-CM

## 2023-12-14 DIAGNOSIS — R293 Abnormal posture: Secondary | ICD-10-CM | POA: Diagnosis not present

## 2023-12-14 DIAGNOSIS — R600 Localized edema: Secondary | ICD-10-CM | POA: Diagnosis not present

## 2023-12-14 NOTE — Therapy (Signed)
 OUTPATIENT PHYSICAL THERAPY TREATMENT   Patient Name: Mary Burton MRN: [id number] DOB:30-Jun-1960, 63 y.o., female Today's Date: 12/14/2023  END OF SESSION:  PT End of Session - 12/14/23 1143     Visit Number 5    Number of Visits 16    Date for Recertification  01/25/24    Authorization Type [facility] [employer] $25 copay    PT Start Time 1143    PT Stop Time 1224    PT Time Calculation (min) 41 min    Activity Tolerance Patient tolerated treatment well    Behavior During Therapy [facility] for tasks assessed/performed              Past Medical History:  Diagnosis Date   Allergy    Arthritis    Environmental allergies    Gout    r hand   Pre-diabetes    Past Surgical History:  Procedure Laterality Date   BICEPT TENODESIS Left 11/14/2023   Procedure: TENODESIS, BICEPS;  Surgeon: Addie Cordella Hamilton, MD;  Location: [facility] OR;  Service: Orthopedics;  Laterality: Left;   COLONOSCOPY  10/12/2011   Pyrtle   OVARIAN CYST REMOVAL     POLYPECTOMY     POSTERIOR LUMBAR FUSION 2 WITH HARDWARE REMOVAL Left 11/14/2023   Procedure: ARTHROSCOPY, SHOULDER WITH DEBRIDEMENT;  Surgeon: Addie Cordella Hamilton, MD;  Location: [facility] OR;  Service: Orthopedics;  Laterality: Left;   SHOULDER OPEN ROTATOR CUFF REPAIR Left 11/14/2023   Procedure: REPAIR, ROTATOR CUFF, OPEN;  Surgeon: Addie Cordella Hamilton, MD;  Location: [facility] OR;  Service: Orthopedics;  Laterality: Left;   TUBAL LIGATION  1985   Patient Active Problem List   Diagnosis Date Noted   Synovitis of left shoulder 12/02/2023   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 12/02/2023   Biceps tendonitis, left 12/02/2023   Complete tear of left rotator cuff 12/02/2023   Pain in right shoulder 10/05/2023   Idiopathic gout of multiple sites 10/05/2023   Right flank pain 08/25/2023   Pain in left shoulder 08/18/2022   Acute cystitis with hematuria 10/06/2021   Prediabetes 10/06/2021   Obesity (BMI 30.0-34.9) 10/06/2021    Impingement syndrome of right shoulder 07/02/2021   Bilateral foot pain 04/02/2014   Bilateral swelling of feet 06/06/2011   Seasonal allergies 06/06/2011   Bilateral knee pain 06/06/2011    PCP: Lendia Boby CROME, NP-C  REFERRING PROVIDER: Addie Cordella Hamilton, MD  REFERRING DIAG: [phone] (ICD-10-CM) - S/P arthroscopy of left shoulder  Rationale for Evaluation and Treatment: Rehabilitation  THERAPY DIAG:  Acute pain of left shoulder  Stiffness of left shoulder, not elsewhere classified  Muscle weakness (generalized)  Abnormal posture  Localized edema  ONSET DATE: DOS: 11/14/23   SUBJECTIVE:  SUBJECTIVE STATEMENT: Her arm was very sore after the last session.  The ice massage helps better than ice machine.  PERTINENT HISTORY:  Lt shoulder scope with Lt shoulder RTC repair / biceps tenodesis on 11/14/23, OA, gout, obesity  PAIN:  Are you having pain?  Yes: NPRS scale:  3-4/10 currently, since last PT up to 5-6/10 Pain location: Lt shoulder Pain description: throbbing, sore Aggravating factors: worse in AM, working ROM Relieving factors: ice  PRECAUTIONS:  Shoulder and Other: PROM/AAROM only   RED FLAGS: None   WEIGHT BEARING RESTRICTIONS:  No  FALLS:  Has patient fallen in last 6 months? 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  PATIENT SURVEYS:  Patient-Specific Activity Scoring Scheme  0 represents "unable to perform." 10 represents  "able to perform at prior level. 0 1 2 3 4 5 6 7 8 9  10 (Date and Score)   Activity Eval     1. Lifting arm in the air 1    2. Reaching behind back 0    3. Hygiene/Grooming 4   Score 1.67    Total score = sum of the activity scores/number of activities Minimum detectable change (90%CI) for average score = 2 points Minimum detectable change (90%CI) for single activity score = 3 points    COGNITIVE STATUS: Within functional limits for tasks assessed   SENSATION: WFL  POSTURE:  rounded shoulders and forward head  HAND DOMINANCE:  Right  GAIT: 11/30/23 Comments: antalgic gait but largely independent; just had injection   PALPATION: 11/30/23 deferred today  EDEMA: 11/30/23 swelling present in Lt shoulder; did not formally measure   UPPER EXTREMITY ROM:  ROM Right eval Left eval Left 12/07/23  Shoulder flexion  P: 95 AA: 150 (Supine)  Shoulder abduction  P: 95 P: 148 (supine)  Shoulder internal rotation  P: 70 (in 60 deg abd; limited by abdomen)   Shoulder external rotation  P: 25 P: 68 (in 40 deg abduction, supine)   (Blank rows = not tested)   UPPER EXTREMITY MMT:  11/30/22: Not formally tested due to post-op precautions  MMT Right eval Left eval  Shoulder flexion    Shoulder extension    Shoulder abduction    Shoulder adduction    Shoulder extension    Shoulder internal rotation    Shoulder external rotation    Middle trapezius    Lower trapezius    Elbow flexion    Elbow extension    Wrist flexion    Wrist extension    Wrist ulnar deviation    Wrist radial deviation    Wrist pronation    Wrist supination    Grip strength     (Blank rows = not tested)    TREATMENT:  DATE:  12/14/23 Therapeutic Activities Pulleys flexion and abduction x 3 min each direction LUE flexion 10 reps 2 sets and scaption 10 reps 1 set  AAROM concentric and AROM eccentric counter to shoulder ht shelf 10 reps.  PT tactile & verbal cues on scapula set prior to moving LUE Scapula retraction green theraband 10 reps 2 sets with PT tactile cues.   LUE IR & ER isometric reactionary yellow theraband 5 reps each with PT tactile & verbal cues on technique including stopping when can not hold neutral position.  PT demo & verbal cues on positioning to limit biceps contraction over long period while holding phone.  Pt verbalized understanding.    Therapeutic Exercise:  Isometric with humerus in line with trunk: 3 sec hold 10 reps ea flexion, adduction, abduction, extension Supine manual light resistance concentric & eccentric biceps curls 10 reps 2 sets Supine triceps press 3# 10 reps 2 sets  Manual Therapy  PROM with PT tactile cues for end range stretch LUE abduction & flexion     TREATMENT:                                                                                                                              DATE:  12/12/23 Therapeutic Activities Pulleys flexion and abduction x 3 min each direction LUE flexion 10 reps 2 sets and scaption 10 reps 1 set AAROM concentric and AROM eccentric counter to shoulder ht shelf 10 reps, then counter to shoulder ht shelf to eye level shelf.  PT tactile & verbal cues on scapula set prior to moving LUE Scapula retraction green theraband 10 reps with PT tactile cues.    Therapeutic Exercise:  Isometric with humerus in line with trunk: 3 sec hold 10 reps ea flexion, adduction, abduction, extension PT updated HEP with HO for flexion & scaption to shelves and isometrics above.  Pt verbaized understanding.   Self-care: PT demo & verbal cues on ice massage.  Pt verbalized and return demo understanding.      TREATMENT:                                                                                                                              DATE:  12/07/23 TherAct Pulleys flexion and  abduction x 3 min each direction AAROM Supine chest press dowel with Serratus punch at end 2x10 reps AAROM Supine dowel flexion with deep breath  end range stretch 2x10 reps Seated chest press 2x10 with 1# bar - cues to decrease shrug Wall ladder flexion x 10 reps - cues to decrease shrug  TherEx ROM measurements - see above for details      12/05/2023 Therapeutic Exercise: Pulley 2 min each flexion & scaption with PT cues on technique.   Pendulum flex /ext, abd/add and circles CW & CCW 10 reps each  Supine cervical retraction 5 sec hold 10 reps Supine scapula retraction 5 sec hold 10 reps AAROM Supine chest press dowel with Serratus punch at end 10 reps AAROM Supine dowel flexion with deep breath end range stretch 10 reps AAROM Supine dowel abduction with deep breath end range stretch 10 reps  Manual Therapy PROM supine flexion & abduction with gentle overpressure.    Self-care PT demo & verbal cues on positioning with arm supported in sitting, supine & right side lying. Pt verbalized understanding.    Vaso left shoulder 34* medium compression 10 minutes with elbow supported in sitting.     HOME EXERCISE PROGRAM: Access Code: [email or web address] URL: [email or web address] Date: 12/12/2023 Prepared by: Grayce Spatz  Exercises - Supine Chin Tuck  - 3 x daily - 7 x weekly - 1 sets - 10 reps - 5 seconds hold - Supine Scapular Retraction  - 3 x daily - 7 x weekly - 1 sets - 10 reps - 5 seconds hold - Supine Shoulder Press with Dowel  - 3 x daily - 7 x weekly - 1 sets - 10 reps - 3-5 sec hold - Supine Shoulder Flexion with Dowel  - 3 x daily - 7 x weekly - 1 sets - 10 reps - 3-5 sec hold - Supine Shoulder Abduction AAROM with Dowel  - 3 x daily - 7 x weekly - 3 sets - 10 reps - Supine Shoulder External Rotation in 45 Degrees Abduction AAROM with Dowel  - 3 x daily - 7 x weekly - 3 sets - 10 reps - Seated Shoulder Flexion AAROM with Pulley Behind  - 3 x daily - 7 x weekly  - 1 sets - 1 reps - 2-3 min - Seated Shoulder Scaption AAROM with Pulley at Side  - 3 x daily - 7 x weekly - 2-3 min - AAROM/AROM Shoulder Flexion at Cabinet  - 1-2 x daily - 7 x weekly - 1-2 sets - 10 reps - 5 seconds hold - AAROM/AROM Shoulder Scaption at Cabinet  - 1-2 x daily - 5 x weekly - 1-2 sets - 10 reps - 5 seconds hold - Isometric Shoulder Flexion at Wall  - 1-2 x daily - 7 x weekly - 2 sets - 10 reps - 5 seconds hold - Isometric Shoulder Extension at Wall  - 1-2 x daily - 7 x weekly - 2 sets - 10 reps - 5 seconds hold - Isometric Shoulder Abduction at Wall  - 1-2 x daily - 7 x weekly - 2 sets - 10 reps - 5 seconds hold - Isometric Shoulder Adduction  - 1-2 x daily - 7 x weekly - 2 sets - 10 reps - 5 seconds hold   ASSESSMENT:  CLINICAL IMPRESSION: Patient tolerated light activation of shoulder muscles better today than last session. Patient appears on target for this time frame in recovery.   She  Pt continues to benefit from skilled PT.   OBJECTIVE IMPAIRMENTS: decreased ROM, decreased strength, hypomobility, increased edema, increased fascial restrictions, increased muscle spasms, impaired flexibility, impaired UE functional use, postural dysfunction,  and pain.   ACTIVITY LIMITATIONS: carrying, lifting, sleeping, bed mobility, bathing, toileting, dressing, reach over head, and hygiene/grooming  PARTICIPATION LIMITATIONS: meal prep, cleaning, laundry, driving, shopping, community activity, and occupation  PERSONAL FACTORS: Age, Behavior pattern, Past/current experiences, Time since onset of injury/illness/exacerbation, and 3+ comorbidities: OA, gout, obesity are also affecting patient's functional outcome.   REHAB POTENTIAL: Good  CLINICAL DECISION MAKING: Evolving/moderate complexity  EVALUATION COMPLEXITY: Moderate   GOALS: Goals reviewed with patient? Yes  SHORT TERM GOALS: Target date: 12/28/2023  Independent with initial HEP Goal status: Ongoing      12/12/2023  2.  Lt shoulder PROM improved by 15 deg all limited directions for improved function Goal status: MET 12/07/23   LONG TERM GOALS: Target date: 01/25/2024  Independent with final HEP Goal status: Ongoing      12/12/2023  2.  PSFS score improved by 3 points Goal status: Ongoing      12/12/2023  3.  Lt shoulder AROM improved to [facility] for improved function and mobility Goal status: Ongoing     12/12/2023  4.  Report pain < 3/10 with reaching activities for improved function Goal status:  Ongoing     12/12/2023  5.  Demonstrate at least 4/5 Lt shoulder strength in order to return to work. Goal status: Ongoing     12/12/2023  6.  Demonstrate work simulated activities in preparation for return to work. Goal status: Ongoing   12/12/2023   PLAN:  PT FREQUENCY: 1-2x/week (anticipate 2x/wk x 4 weeks, then depending on progress 1-2x/wk x 4 weeks)  PT DURATION: 8 weeks  PLANNED INTERVENTIONS: 97164- PT Re-evaluation, 97750- Physical Performance Testing, 97110-Therapeutic exercises, 97530- Therapeutic activity, 97112- Neuromuscular re-education, 97535- Self Care, 02859- Manual therapy, [phone]- Aquatic Therapy, G0283- Electrical stimulation (unattended), 97016- Vasopneumatic device, [id number]- Ionotophoresis 4mg /ml Dexamethasone , 79439 (1-2 muscles), 20561 (3+ muscles)- Dry Needling, Patient/Family education, Taping, Joint mobilization, Joint manipulation, Cryotherapy, and Moist heat.  PLAN FOR NEXT SESSION: send progress note to MD prior to office visit.  progress to sitting/standing if not too sore, manual for ROM, AA/PROM only at this time  NEXT MD VISIT: 12/20/23   Grayce Spatz, PT, DPT 12/14/2023, 12:43 PM

## 2023-12-18 ENCOUNTER — Telehealth: Payer: Self-pay | Admitting: Orthopedic Surgery

## 2023-12-18 DIAGNOSIS — M25551 Pain in right hip: Secondary | ICD-10-CM

## 2023-12-18 NOTE — Telephone Encounter (Signed)
 Patient called. She would like to know what she can take for her pain otc?

## 2023-12-18 NOTE — Telephone Encounter (Signed)
 Ok for 6 mo hc placard Also needs mri sacrum with h/o fx and continued pain thx pls order

## 2023-12-18 NOTE — Addendum Note (Signed)
 Addended by: VANDERBILT LIONEL CROME on: 12/18/2023 04:27 PM   Modules accepted: Orders

## 2023-12-18 NOTE — Telephone Encounter (Signed)
 Lauren FYI-I called and talked to the pt. She stated she wants to wait to schedule the MRI until she talks to Dr Addie on Wednesday. I told her about the handicap and she said she will just pick that up at the appt as well

## 2023-12-18 NOTE — Telephone Encounter (Signed)
 I would say combination of Tylenol , Celebrex, Robaxin  would be reasonable

## 2023-12-19 ENCOUNTER — Encounter: Admitting: Physical Therapy

## 2023-12-20 ENCOUNTER — Telehealth: Payer: Self-pay

## 2023-12-20 ENCOUNTER — Encounter: Admitting: Physical Therapy

## 2023-12-20 ENCOUNTER — Encounter: Payer: Self-pay | Admitting: Orthopedic Surgery

## 2023-12-20 ENCOUNTER — Ambulatory Visit: Admitting: Orthopedic Surgery

## 2023-12-20 ENCOUNTER — Telehealth: Payer: Self-pay | Admitting: Orthopedic Surgery

## 2023-12-20 DIAGNOSIS — M25461 Effusion, right knee: Secondary | ICD-10-CM

## 2023-12-20 NOTE — Progress Notes (Unsigned)
 sy

## 2023-12-20 NOTE — Telephone Encounter (Signed)
 Dr Addie asking to change current MRI sacrum to MRI lumbar spine.  Patient seen today and based off of her current symptoms he would like to make this change.

## 2023-12-20 NOTE — Telephone Encounter (Signed)
 Patient is requesting a note for her part-time job. A RTW note on 01/12/24 for part time job only. 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 Please let me know when note is ready. Thank you!

## 2023-12-21 ENCOUNTER — Encounter: Admitting: Physical Therapy

## 2023-12-21 ENCOUNTER — Encounter: Payer: Self-pay | Admitting: Orthopedic Surgery

## 2023-12-21 ENCOUNTER — Other Ambulatory Visit: Payer: Self-pay | Admitting: Orthopedic Surgery

## 2023-12-21 DIAGNOSIS — M25551 Pain in right hip: Secondary | ICD-10-CM

## 2023-12-21 DIAGNOSIS — M5416 Radiculopathy, lumbar region: Secondary | ICD-10-CM

## 2023-12-21 NOTE — Telephone Encounter (Signed)
 Order changed.

## 2023-12-21 NOTE — Telephone Encounter (Signed)
 Yes  thx

## 2023-12-21 NOTE — Telephone Encounter (Signed)
 Currently working on this.  Patient is already scheduled for MRI sacrum, so speaking with Dewane Sor to make sure appt stays when I change order.

## 2023-12-21 NOTE — Telephone Encounter (Signed)
 Note in chart

## 2023-12-21 NOTE — Progress Notes (Unsigned)
 Office Visit Note   Patient: Mary Burton           Date of Birth: December 27, 1960           MRN: [id number] Visit Date: 12/20/2023 Requested by: Lendia Boby CROME, NP-C [address] [place],  [place] [address] PCP: Lendia Boby CROME, NP-C  Subjective: Chief Complaint  Patient presents with   Right Knee - Pain   Other    11/14/23 (5w 1d) Arthroscopy, Shoulder With Debridement - Left  Repair, Rotator Cuff, Open - Left  Tenodesis, Biceps - Left       HPI: Mary Burton is a 63 y.o. female who presents to the office reporting for follow-up of left shoulder surgery.  Overall that left shoulder is doing well now about a month out from rotator cuff tear repair and biceps tenodesis.  She has forward flexion abduction above 90 and no crepitus with passive range of motion.  She also reports significant right knee pain as well as whole leg pain.  Left knee doing well.  She has not really had any sleep in 5 days because of her knees and right leg.  Denies any groin pain.  Ibuprofen has helped.  She does have a history of having a nondisplaced sacral fracture several months ago prior to her rotator cuff surgery..                ROS: All systems reviewed are negative as they relate to the chief complaint within the history of present illness.  Patient denies fevers or chills.  Assessment & Plan: Visit Diagnoses:  1. Effusion, right knee     Plan: Impression is patient is doing well with her left shoulder surgery.  I do want her to be careful with any lifting particularly grandchildren out in front of her.  The right knee looks like it may have gout.  Patient has a history of gout.  Sheet provided which discusses foods to avoid.  Knee aspirated and the fluid is sent for Gram stain culture and crystal analysis.  Toradol  injected.  Continue with ibuprofen in case this is gout.  Follow-up in early December.  Patient did have MRI scan scheduled for her sacrum but I think it would be a better  option for her to get that of her back as she is having some right sided radiculopathy in addition to this right knee pain.  Follow-Up Instructions: No follow-ups on file.   Orders:  Orders Placed This Encounter  Procedures   Anaerobic and Aerobic Culture   Synovial Fluid Analysis, Complete   Synovial fluid, crystal   No orders of the defined types were placed in this encounter.     Procedures: Large Joint Inj: R knee on 12/20/2023 6:05 AM Indications: diagnostic evaluation, joint swelling and pain Details: 18 G 1.5 in needle, superolateral approach  Arthrogram: No  Medications: 5 mL lidocaine  1 %; 4 mL bupivacaine  0.25 % Outcome: tolerated well, no immediate complications Procedure, treatment alternatives, risks and benefits explained, specific risks discussed. Consent was given by the patient. Immediately prior to procedure a time out was called to verify the correct patient, procedure, equipment, support staff and site/side marked as required. Patient was prepped and draped in the usual sterile fashion.    Toradol  injected.  Aspiration sent for crystals aerobic anaerobic culture and cell count   Clinical Data: No additional findings.  Objective: Vital Signs: LMP 05/22/2011   Physical Exam:  Constitutional: Patient appears well-developed HEENT:  Head: Normocephalic Eyes:EOM are normal Neck: Normal range of motion Cardiovascular: Normal rate Pulmonary/chest: Effort normal Neurologic: Patient is alert Skin: Skin is warm Psychiatric: Patient has normal mood and affect  Ortho Exam: Right knee examination demonstrates mild effusion.  Lacks about 5 degrees of full extension but can flex past 90.  Collateral crucial ligaments are stable.  No proximal lymphadenopathy.  Left shoulder demonstrates improved range of motion and strength compared to prior visit.  No crepitus is present.  No groin pain with internal/external rotation of the right or left leg.  Equivocal nerve  root tension testing on the right compared to the left.  Reflexes symmetric 0 to 1+ out of 4 bilateral patella and Achilles.  Patient has good ankle dorsiflexion plantarflexion quad and hamstring strength but does report pain radiating from the anterior superior iliac crest down into the ankle.  No definite paresthesias L1-S1 bilaterally.  Specialty Comments:  No specialty comments available.  Imaging: No results found.   PMFS History: Patient Active Problem List   Diagnosis Date Noted   Synovitis of left shoulder 12/02/2023   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 12/02/2023   Biceps tendonitis, left 12/02/2023   Complete tear of left rotator cuff 12/02/2023   Pain in right shoulder 10/05/2023   Idiopathic gout of multiple sites 10/05/2023   Right flank pain 08/25/2023   Pain in left shoulder 08/18/2022   Acute cystitis with hematuria 10/06/2021   Prediabetes 10/06/2021   Obesity (BMI 30.0-34.9) 10/06/2021   Impingement syndrome of right shoulder 07/02/2021   Bilateral foot pain 04/02/2014   Bilateral swelling of feet 06/06/2011   Seasonal allergies 06/06/2011   Bilateral knee pain 06/06/2011   Past Medical History:  Diagnosis Date   Allergy    Arthritis    Environmental allergies    Gout    r hand   Pre-diabetes     Family History  Problem Relation Age of Onset   Colon cancer Mother        unsure age   Hypertension Father    Early death Sister    High blood pressure Sister    Colon polyps Neg Hx    Esophageal cancer Neg Hx    Rectal cancer Neg Hx    Stomach cancer Neg Hx     Past Surgical History:  Procedure Laterality Date   BICEPT TENODESIS Left 11/14/2023   Procedure: TENODESIS, BICEPS;  Surgeon: Addie Cordella Hamilton, MD;  Location: [facility] OR;  Service: Orthopedics;  Laterality: Left;   COLONOSCOPY  10/12/2011   Pyrtle   OVARIAN CYST REMOVAL     POLYPECTOMY     POSTERIOR LUMBAR FUSION 2 WITH HARDWARE REMOVAL Left  11/14/2023   Procedure: ARTHROSCOPY, SHOULDER WITH DEBRIDEMENT;  Surgeon: Addie Cordella Hamilton, MD;  Location: [facility] OR;  Service: Orthopedics;  Laterality: Left;   SHOULDER OPEN ROTATOR CUFF REPAIR Left 11/14/2023   Procedure: REPAIR, ROTATOR CUFF, OPEN;  Surgeon: Addie Cordella Hamilton, MD;  Location: [facility] OR;  Service: Orthopedics;  Laterality: Left;   TUBAL LIGATION  1985   Social History   Occupational History   Not on file  Tobacco Use   Smoking status: Never   Smokeless tobacco: Never  Vaping Use   Vaping status: Never Used  Substance and Sexual Activity   Alcohol use: No   Drug use: No   Sexual activity: Yes    Birth control/protection: None

## 2023-12-22 ENCOUNTER — Encounter: Admitting: Physical Therapy

## 2023-12-22 MED ORDER — BUPIVACAINE HCL 0.25 % IJ SOLN
4.0000 mL | INTRAMUSCULAR | Status: AC | PRN
  Administered 2023-12-20: 4 mL via INTRA_ARTICULAR

## 2023-12-22 MED ORDER — LIDOCAINE HCL 1 % IJ SOLN
5.0000 mL | INTRAMUSCULAR | Status: AC | PRN
  Administered 2023-12-20: 5 mL

## 2023-12-25 ENCOUNTER — Telehealth: Payer: Self-pay | Admitting: Orthopedic Surgery

## 2023-12-25 NOTE — Telephone Encounter (Signed)
 Imaging studies as ordered.  I really think this is likely coming from her back.

## 2023-12-25 NOTE — Telephone Encounter (Signed)
 Pt called saying that she sill can't walk and that she can't do her therapy and that she is still going to have her MRI on Wed. She also wants to know if they are going to do the MRI on her leg as well. She also said that she needs to get her handicap placard on Wed as well. Her daughter is picking that up as well after the MRI. Pt call back number is [phone]

## 2023-12-26 ENCOUNTER — Encounter: Admitting: Physical Therapy

## 2023-12-26 LAB — ANAEROBIC AND AEROBIC CULTURE
AER RESULT:: NO GROWTH
MICRO NUMBER:: 17225803
MICRO NUMBER:: 17225804
SPECIMEN QUALITY:: ADEQUATE
SPECIMEN QUALITY:: ADEQUATE

## 2023-12-26 LAB — SYNOVIAL FLUID ANALYSIS, COMPLETE
Basophils, %: 0 %
Eosinophils-Synovial: 0 % (ref 0–2)
Lymphocytes-Synovial Fld: 4 % (ref 0–74)
Monocyte/Macrophage: 4 % (ref 0–69)
Neutrophil, Synovial: 92 % — ABNORMAL HIGH (ref 0–24)
Synoviocytes, %: 0 % (ref 0–15)
WBC, Synovial: 31110 {cells}/uL — ABNORMAL HIGH (ref <150)

## 2023-12-27 ENCOUNTER — Telehealth: Payer: Self-pay | Admitting: Orthopedic Surgery

## 2023-12-27 ENCOUNTER — Ambulatory Visit
Admission: RE | Admit: 2023-12-27 | Discharge: 2023-12-27 | Disposition: A | Source: Ambulatory Visit | Attending: Orthopedic Surgery | Admitting: Orthopedic Surgery

## 2023-12-27 DIAGNOSIS — M4727 Other spondylosis with radiculopathy, lumbosacral region: Secondary | ICD-10-CM | POA: Diagnosis not present

## 2023-12-27 DIAGNOSIS — M5116 Intervertebral disc disorders with radiculopathy, lumbar region: Secondary | ICD-10-CM | POA: Diagnosis not present

## 2023-12-27 DIAGNOSIS — M4316 Spondylolisthesis, lumbar region: Secondary | ICD-10-CM | POA: Diagnosis not present

## 2023-12-27 DIAGNOSIS — M5416 Radiculopathy, lumbar region: Secondary | ICD-10-CM

## 2023-12-27 DIAGNOSIS — M25551 Pain in right hip: Secondary | ICD-10-CM

## 2023-12-27 NOTE — Telephone Encounter (Signed)
 IC patient,lmvm advised that I did refax the matrix forms per her request.

## 2023-12-27 NOTE — Telephone Encounter (Signed)
 Her aspirate had 32,000 white cells.  Needs to come in and get it aspirated again.  Cultures no growth but still unclear why she has that many white cells in her knee.

## 2023-12-27 NOTE — Therapy (Incomplete)
 OUTPATIENT PHYSICAL THERAPY TREATMENT   Patient Name: Mary Burton MRN: [id number] DOB:07-30-1960, 63 y.o., female Today's Date: 12/27/2023  END OF SESSION:        Past Medical History:  Diagnosis Date   Allergy    Arthritis    Environmental allergies    Gout    r hand   Pre-diabetes    Past Surgical History:  Procedure Laterality Date   BICEPT TENODESIS Left 11/14/2023   Procedure: TENODESIS, BICEPS;  Surgeon: Addie Cordella Hamilton, MD;  Location: [facility] OR;  Service: Orthopedics;  Laterality: Left;   COLONOSCOPY  10/12/2011   Pyrtle   OVARIAN CYST REMOVAL     POLYPECTOMY     POSTERIOR LUMBAR FUSION 2 WITH HARDWARE REMOVAL Left 11/14/2023   Procedure: ARTHROSCOPY, SHOULDER WITH DEBRIDEMENT;  Surgeon: Addie Cordella Hamilton, MD;  Location: [facility] OR;  Service: Orthopedics;  Laterality: Left;   SHOULDER OPEN ROTATOR CUFF REPAIR Left 11/14/2023   Procedure: REPAIR, ROTATOR CUFF, OPEN;  Surgeon: Addie Cordella Hamilton, MD;  Location: [facility] OR;  Service: Orthopedics;  Laterality: Left;   TUBAL LIGATION  1985   Patient Active Problem List   Diagnosis Date Noted   Synovitis of left shoulder 12/02/2023   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 12/02/2023   Biceps tendonitis, left 12/02/2023   Complete tear of left rotator cuff 12/02/2023   Pain in right shoulder 10/05/2023   Idiopathic gout of multiple sites 10/05/2023   Right flank pain 08/25/2023   Pain in left shoulder 08/18/2022   Acute cystitis with hematuria 10/06/2021   Prediabetes 10/06/2021   Obesity (BMI 30.0-34.9) 10/06/2021   Impingement syndrome of right shoulder 07/02/2021   Bilateral foot pain 04/02/2014   Bilateral swelling of feet 06/06/2011   Seasonal allergies 06/06/2011   Bilateral knee pain 06/06/2011    PCP: Lendia Boby CROME, NP-C  REFERRING PROVIDER: Addie Cordella Hamilton, MD  REFERRING DIAG: [phone] (ICD-10-CM) - S/P arthroscopy of left shoulder  Rationale for  Evaluation and Treatment: Rehabilitation  THERAPY DIAG:  No diagnosis found.  ONSET DATE: DOS: 11/14/23   SUBJECTIVE:                                                                                                                                                                                           SUBJECTIVE STATEMENT: ***Her arm was very sore after the last session.  The ice massage helps better than ice machine.  PERTINENT HISTORY:  Lt shoulder scope with Lt shoulder RTC repair / biceps tenodesis on 11/14/23, OA, gout, obesity  PAIN:  ***Are you having pain?  Yes: NPRS scale:  3-4/10  currently, since last PT up to 5-6/10 Pain location: Lt shoulder Pain description: throbbing, sore Aggravating factors: worse in AM, working ROM Relieving factors: ice  PRECAUTIONS:  Shoulder and Other: PROM/AAROM only   RED FLAGS: None   WEIGHT BEARING RESTRICTIONS:  No  FALLS:  Has patient fallen in last 6 months? 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  PATIENT SURVEYS:  Patient-Specific Activity Scoring Scheme  0 represents "unable to perform." 10 represents "able to perform at prior level. 0 1 2 3 4 5 6 7 8 9  10 (Date and Score)   Activity Eval     1. Lifting arm in the air 1    2. Reaching behind back 0    3. Hygiene/Grooming 4   Score 1.67    Total score = sum of the activity scores/number of activities Minimum detectable change (90%CI) for average score = 2 points Minimum detectable change (90%CI) for single activity score = 3 points    COGNITIVE STATUS: Within functional limits for tasks  assessed   SENSATION: WFL  POSTURE:  rounded shoulders and forward head  HAND DOMINANCE:  Right  GAIT: 11/30/23 Comments: antalgic gait but largely independent; just had injection   PALPATION: 11/30/23 deferred today  EDEMA: 11/30/23 swelling present in Lt shoulder; did not formally measure   UPPER EXTREMITY ROM:  ROM Right eval Left eval Left 12/07/23  Shoulder flexion  P: 95 AA: 150 (Supine)  Shoulder abduction  P: 95 P: 148 (supine)  Shoulder internal rotation  P: 70 (in 60 deg abd; limited by abdomen)   Shoulder external rotation  P: 25 P: 68 (in 40 deg abduction, supine)   (Blank rows = not tested)   UPPER EXTREMITY MMT:  11/30/22: Not formally tested due to post-op precautions  MMT Right eval Left eval  Shoulder flexion    Shoulder extension    Shoulder abduction    Shoulder adduction    Shoulder extension    Shoulder internal rotation    Shoulder external rotation    Middle trapezius    Lower trapezius    Elbow flexion    Elbow extension    Wrist flexion    Wrist extension    Wrist ulnar deviation    Wrist radial deviation    Wrist pronation    Wrist supination    Grip strength     (Blank rows = not tested)    TREATMENT:                                                                                                                              DATE:  12/28/23*** L Shoulder     12/14/23 Therapeutic Activities Pulleys flexion and abduction x 3 min each direction LUE flexion 10 reps 2 sets and scaption 10 reps 1 set AAROM concentric and AROM eccentric counter to shoulder ht shelf 10 reps.  PT tactile & verbal cues on scapula set prior to moving LUE Scapula retraction green theraband 10 reps 2 sets with PT tactile cues.   LUE IR & ER isometric reactionary yellow theraband 5 reps each with PT tactile & verbal cues on technique including stopping when can not hold neutral position.  PT demo & verbal cues on positioning to limit biceps  contraction over long period while holding phone.  Pt verbalized understanding.    Therapeutic Exercise:  Isometric with humerus in line with trunk: 3 sec hold 10 reps ea flexion, adduction, abduction, extension Supine manual light resistance concentric & eccentric biceps curls 10 reps 2 sets Supine triceps press 3# 10 reps 2 sets  Manual Therapy  PROM with PT tactile cues for end range stretch LUE abduction & flexion     TREATMENT:                                                                                                                              DATE:  12/12/23 Therapeutic Activities Pulleys flexion and abduction x 3 min each direction LUE flexion 10 reps 2 sets and scaption 10 reps 1 set AAROM concentric and AROM eccentric counter to shoulder ht shelf 10 reps, then counter to shoulder ht shelf to eye level shelf.  PT tactile & verbal cues on scapula set prior to moving LUE Scapula retraction green theraband 10 reps with PT tactile cues.    Therapeutic Exercise:  Isometric with humerus in line with trunk: 3 sec hold 10 reps ea flexion, adduction, abduction, extension PT updated HEP with HO for flexion & scaption to shelves and isometrics above.  Pt verbaized understanding.   Self-care: PT demo & verbal cues on ice massage.  Pt verbalized and return demo understanding.      TREATMENT:                                                                                                                              DATE:  12/07/23 TherAct Pulleys flexion and abduction x 3 min each direction AAROM Supine chest press dowel with Serratus punch at end 2x10 reps AAROM Supine dowel  flexion with deep breath end range stretch 2x10 reps Seated chest press 2x10 with 1# bar - cues to decrease shrug Wall ladder flexion x 10 reps - cues to decrease shrug  TherEx ROM measurements - see above for details      12/05/2023 Therapeutic Exercise: Pulley 2 min each flexion & scaption with  PT cues on technique.   Pendulum flex /ext, abd/add and circles CW & CCW 10 reps each  Supine cervical retraction 5 sec hold 10 reps Supine scapula retraction 5 sec hold 10 reps AAROM Supine chest press dowel with Serratus punch at end 10 reps AAROM Supine dowel flexion with deep breath end range stretch 10 reps AAROM Supine dowel abduction with deep breath end range stretch 10 reps  Manual Therapy PROM supine flexion & abduction with gentle overpressure.    Self-care PT demo & verbal cues on positioning with arm supported in sitting, supine & right side lying. Pt verbalized understanding.    Vaso left shoulder 34* medium compression 10 minutes with elbow supported in sitting.     HOME EXERCISE PROGRAM: Access Code: [email or web address] URL: [email or web address] Date: 12/12/2023 Prepared by: Grayce Spatz  Exercises - Supine Chin Tuck  - 3 x daily - 7 x weekly - 1 sets - 10 reps - 5 seconds hold - Supine Scapular Retraction  - 3 x daily - 7 x weekly - 1 sets - 10 reps - 5 seconds hold - Supine Shoulder Press with Dowel  - 3 x daily - 7 x weekly - 1 sets - 10 reps - 3-5 sec hold - Supine Shoulder Flexion with Dowel  - 3 x daily - 7 x weekly - 1 sets - 10 reps - 3-5 sec hold - Supine Shoulder Abduction AAROM with Dowel  - 3 x daily - 7 x weekly - 3 sets - 10 reps - Supine Shoulder External Rotation in 45 Degrees Abduction AAROM with Dowel  - 3 x daily - 7 x weekly - 3 sets - 10 reps - Seated Shoulder Flexion AAROM with Pulley Behind  - 3 x daily - 7 x weekly - 1 sets - 1 reps - 2-3 min - Seated Shoulder Scaption AAROM with Pulley at Side  - 3 x daily - 7 x weekly - 2-3 min - AAROM/AROM Shoulder Flexion at Cabinet  - 1-2 x daily - 7 x weekly - 1-2 sets - 10 reps - 5 seconds hold - AAROM/AROM Shoulder Scaption at Cabinet  - 1-2 x daily - 5 x weekly - 1-2 sets - 10 reps - 5 seconds hold - Isometric Shoulder Flexion at Wall  - 1-2 x daily - 7 x weekly - 2 sets - 10 reps - 5 seconds  hold - Isometric Shoulder Extension at Wall  - 1-2 x daily - 7 x weekly - 2 sets - 10 reps - 5 seconds hold - Isometric Shoulder Abduction at Wall  - 1-2 x daily - 7 x weekly - 2 sets - 10 reps - 5 seconds hold - Isometric Shoulder Adduction  - 1-2 x daily - 7 x weekly - 2 sets - 10 reps - 5 seconds hold   ASSESSMENT:  CLINICAL IMPRESSION: ***Patient tolerated light activation of shoulder muscles better today than last session. Patient appears on target for this time frame in recovery.   She  Pt continues to benefit from skilled PT.   OBJECTIVE IMPAIRMENTS: decreased ROM, decreased strength, hypomobility, increased edema, increased fascial restrictions, increased muscle spasms, impaired flexibility, impaired  UE functional use, postural dysfunction, and pain.   ACTIVITY LIMITATIONS: carrying, lifting, sleeping, bed mobility, bathing, toileting, dressing, reach over head, and hygiene/grooming  PARTICIPATION LIMITATIONS: meal prep, cleaning, laundry, driving, shopping, community activity, and occupation  PERSONAL FACTORS: Age, Behavior pattern, Past/current experiences, Time since onset of injury/illness/exacerbation, and 3+ comorbidities: OA, gout, obesity are also affecting patient's functional outcome.   REHAB POTENTIAL: Good  CLINICAL DECISION MAKING: Evolving/moderate complexity  EVALUATION COMPLEXITY: Moderate   GOALS: Goals reviewed with patient? Yes  SHORT TERM GOALS: Target date: 12/28/2023  Independent with initial HEP Goal status: Ongoing     12/12/2023  2.  Lt shoulder PROM improved by 15 deg all limited directions for improved function Goal status: MET 12/07/23   LONG TERM GOALS: Target date: 01/25/2024  Independent with final HEP Goal status: Ongoing      12/12/2023  2.  PSFS score improved by 3 points Goal status: Ongoing      12/12/2023  3.  Lt shoulder AROM improved to [facility] for improved function and mobility Goal status: Ongoing     12/12/2023  4.  Report  pain < 3/10 with reaching activities for improved function Goal status:  Ongoing     12/12/2023  5.  Demonstrate at least 4/5 Lt shoulder strength in order to return to work. Goal status: Ongoing     12/12/2023  6.  Demonstrate work simulated activities in preparation for return to work. Goal status: Ongoing   12/12/2023   PLAN:  PT FREQUENCY: 1-2x/week (anticipate 2x/wk x 4 weeks, then depending on progress 1-2x/wk x 4 weeks)  PT DURATION: 8 weeks  PLANNED INTERVENTIONS: 97164- PT Re-evaluation, 97750- Physical Performance Testing, 97110-Therapeutic exercises, 97530- Therapeutic activity, 97112- Neuromuscular re-education, 97535- Self Care, 02859- Manual therapy, [phone]- Aquatic Therapy, G0283- Electrical stimulation (unattended), 97016- Vasopneumatic device, [id number]- Ionotophoresis 4mg /ml Dexamethasone , 79439 (1-2 muscles), 20561 (3+ muscles)- Dry Needling, Patient/Family education, Taping, Joint mobilization, Joint manipulation, Cryotherapy, and Moist heat.  PLAN FOR NEXT SESSION: ***send progress note to MD prior to office visit.  progress to sitting/standing if not too sore, manual for ROM, AA/PROM only at this time  NEXT MD VISIT: 12/20/23   Burnard CHRISTELLA Meth, PT, DPT 12/27/2023, 8:32 AM

## 2023-12-27 NOTE — Telephone Encounter (Signed)
 Pt called saying that she is still having swelling left knee to the point she can't hardly walk. She did go get the MRI this morning. She says she can't do her therapy because of the swelling. Call back number is [phone]

## 2023-12-27 NOTE — Telephone Encounter (Signed)
 Received vm from patient stating [employer] did not receive the forms faxed 12/20/23. I refaxed [phone]

## 2023-12-28 ENCOUNTER — Encounter

## 2023-12-28 ENCOUNTER — Ambulatory Visit: Admitting: Orthopedic Surgery

## 2023-12-28 ENCOUNTER — Other Ambulatory Visit (HOSPITAL_COMMUNITY): Payer: Self-pay

## 2023-12-28 DIAGNOSIS — M25461 Effusion, right knee: Secondary | ICD-10-CM | POA: Diagnosis not present

## 2023-12-28 DIAGNOSIS — M5416 Radiculopathy, lumbar region: Secondary | ICD-10-CM

## 2023-12-28 DIAGNOSIS — M25462 Effusion, left knee: Secondary | ICD-10-CM | POA: Diagnosis not present

## 2023-12-28 MED ORDER — PREDNISONE 5 MG (21) PO TBPK
ORAL_TABLET | ORAL | 0 refills | Status: AC
  Filled 2023-12-28: qty 21, 6d supply, fill #0

## 2023-12-28 NOTE — Telephone Encounter (Signed)
 Coming this afternoon 145pm

## 2023-12-29 ENCOUNTER — Encounter: Payer: Self-pay | Admitting: Orthopedic Surgery

## 2023-12-29 MED ORDER — LIDOCAINE HCL 1 % IJ SOLN
5.0000 mL | INTRAMUSCULAR | Status: AC | PRN
  Administered 2023-12-28: 5 mL

## 2023-12-29 NOTE — Progress Notes (Signed)
 Office Visit Note   Patient: Mary Burton           Date of Birth: 1961/01/28           MRN: [id number] Visit Date: 12/28/2023 Requested by: Lendia Boby CROME, NP-C [address] [place],  [place] [address] PCP: Lendia Boby CROME, NP-C  Subjective: Chief Complaint  Patient presents with   Other    Follow up right knee for repeat aspiration    HPI: Mary Burton is a 63 y.o. female who presents to the office reporting continued difficulty ambulating because of bilateral knee pain.  She also has not really been able to do her exercises for the left shoulder as much because of her difficulty getting around.  Since she was last seen she has had an MRI scan.  She also had her right knee aspirated earlier which had a white count of 31,000 but no organisms and no crystals.  Cultures negative growth to date.  She denies any fevers or chills..                ROS: All systems reviewed are negative as they relate to the chief complaint within the history of present illness.  Patient denies fevers or chills.  Assessment & Plan: Visit Diagnoses:  1. Effusion, right knee   2. Radiculopathy, lumbar region   3. Effusion, left knee     Plan: Impression is spinal stenosis in the lumbar spine based on review of MRI scan.  This is occurring at L3-4 and definitely has more foraminal stenosis on the right than the left.  That does correlate with her symptoms.  She also has recurrent effusions of both knees.  Both knees were aspirated today.  Cloudy fluid obtained.  Sent for Gram stain cell count aerobic and anaerobic culture as well as crystal analysis.  Would like to try her on Medrol  Dosepak for 6 days.  CBC DIF sed rate C-reactive protein uric acid anti-CCP antibody rheumatoid factor and ANA are drawn.  Will refer to Dr. Eldonna for lumbar spine ESI.  Refer to [name] for evaluation next available appointment.  I think she is potentially heading for surgery based on the amount of disability she  is having from this somewhat focal area of spinal stenosis.  Reviewed to continue her with shoulder therapy as well but also add in lumbar spine therapy.  Her shoulder range of motion has diminished compared to previous visits. She needs to refocus her efforts on getting the shoulder range of motion restored as well.  She will follow-up with us  in about 4 weeks.  Once those studies come in I will call her with the results.  She will be out of work at least 8 more weeks until we sort out the cause of her joint swelling as well as the results of intervention for her spinal stenosis  Follow-Up Instructions: No follow-ups on file.   Orders:  Orders Placed This Encounter  Procedures   Anaerobic and Aerobic Culture   Anaerobic and Aerobic Culture   CBC with Differential   Sed Rate (ESR)   C-reactive protein   Uric acid   Cyclic citrul peptide antibody, IgG   Rheumatoid Factor   Antinuclear Antib (ANA)   Synovial fluid, crystal   Synovial Fluid Analysis, Complete   Synovial fluid, crystal   Synovial Fluid Analysis, Complete   Ambulatory referral to Orthopedic Surgery   Ambulatory referral to Physical Medicine Rehab   Meds ordered this  encounter  Medications   predniSONE  (STERAPRED UNI-PAK 21 TAB) 5 MG (21) TBPK tablet    Sig: Take dosepak as directed    Dispense:  21 tablet    Refill:  0      Procedures: Large Joint Inj: bilateral knee on 12/28/2023 7:37 AM Indications: diagnostic evaluation, joint swelling and pain Details: 18 G 1.5 in needle, superolateral approach  Arthrogram: No  Medications (Right): 5 mL lidocaine  1 % Aspirate (Right): 25 mL cloudy; sent for lab analysis Medications (Left): 5 mL lidocaine  1 % Aspirate (Left): 25 mL cloudy Outcome: tolerated well, no immediate complications Procedure, treatment alternatives, risks and benefits explained, specific risks discussed. Consent was given by the patient. Immediately prior to procedure a time out was called to verify  the correct patient, procedure, equipment, support staff and site/side marked as required. Patient was prepped and draped in the usual sterile fashion.       Clinical Data: No additional findings.  Objective: Vital Signs: LMP 05/22/2011   Physical Exam:  Constitutional: Patient appears well-developed HEENT:  Head: Normocephalic Eyes:EOM are normal Neck: Normal range of motion Cardiovascular: Normal rate Pulmonary/chest: Effort normal Neurologic: Patient is alert Skin: Skin is warm Psychiatric: Patient has normal mood and affect  Ortho Exam: Ortho exam demonstrates mild effusion in both knees.  Patient has about 5 degree flexion contractures in both knees.  Ankle dorsiflexion intact.  5 out of 5 quad and hamstring strength present with no groin pain with internal/external rotation of the leg.  No lymphadenopathy present proximally.  No definite paresthesias L1-S1 bilaterally.  Flexion is to about 100 degrees bilaterally and is mildly painful.  Specialty Comments:  No specialty comments available.  Imaging: No results found.   PMFS History: Patient Active Problem List   Diagnosis Date Noted   Synovitis of left shoulder 12/02/2023   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 12/02/2023   Biceps tendonitis, left 12/02/2023   Complete tear of left rotator cuff 12/02/2023   Pain in right shoulder 10/05/2023   Idiopathic gout of multiple sites 10/05/2023   Right flank pain 08/25/2023   Pain in left shoulder 08/18/2022   Acute cystitis with hematuria 10/06/2021   Prediabetes 10/06/2021   Obesity (BMI 30.0-34.9) 10/06/2021   Impingement syndrome of right shoulder 07/02/2021   Bilateral foot pain 04/02/2014   Bilateral swelling of feet 06/06/2011   Seasonal allergies 06/06/2011   Bilateral knee pain 06/06/2011   Past Medical History:  Diagnosis Date   Allergy    Arthritis    Environmental allergies    Gout    r hand   Pre-diabetes      Family History  Problem Relation Age of Onset   Colon cancer Mother        unsure age   Hypertension Father    Early death Sister    High blood pressure Sister    Colon polyps Neg Hx    Esophageal cancer Neg Hx    Rectal cancer Neg Hx    Stomach cancer Neg Hx     Past Surgical History:  Procedure Laterality Date   BICEPT TENODESIS Left 11/14/2023   Procedure: TENODESIS, BICEPS;  Surgeon: Addie Cordella Hamilton, MD;  Location: [facility] OR;  Service: Orthopedics;  Laterality: Left;   COLONOSCOPY  10/12/2011   Pyrtle   OVARIAN CYST REMOVAL     POLYPECTOMY     POSTERIOR LUMBAR FUSION 2 WITH HARDWARE REMOVAL Left 11/14/2023   Procedure: ARTHROSCOPY, SHOULDER WITH DEBRIDEMENT;  Surgeon: Addie Cordella Hamilton, MD;  Location: [facility] OR;  Service: Orthopedics;  Laterality: Left;   SHOULDER OPEN ROTATOR CUFF REPAIR Left 11/14/2023   Procedure: REPAIR, ROTATOR CUFF, OPEN;  Surgeon: Addie Cordella Hamilton, MD;  Location: [facility] OR;  Service: Orthopedics;  Laterality: Left;   TUBAL LIGATION  1985   Social History   Occupational History   Not on file  Tobacco Use   Smoking status: Never   Smokeless tobacco: Never  Vaping Use   Vaping status: Never Used  Substance and Sexual Activity   Alcohol use: No   Drug use: No   Sexual activity: Yes    Birth control/protection: None

## 2023-12-31 LAB — CBC WITH DIFFERENTIAL/PLATELET
Absolute Lymphocytes: 1365 {cells}/uL (ref 850–3900)
Absolute Monocytes: 501 {cells}/uL (ref 200–950)
Basophils Absolute: 27 {cells}/uL (ref 0–200)
Basophils Relative: 0.3 %
Eosinophils Absolute: 109 {cells}/uL (ref 15–500)
Eosinophils Relative: 1.2 %
HCT: 34.3 % — ABNORMAL LOW (ref 35.0–45.0)
Hemoglobin: 11 g/dL — ABNORMAL LOW (ref 11.7–15.5)
MCH: 27.6 pg (ref 27.0–33.0)
MCHC: 32.1 g/dL (ref 32.0–36.0)
MCV: 86.2 fL (ref 80.0–100.0)
MPV: 10.5 fL (ref 7.5–12.5)
Monocytes Relative: 5.5 %
Neutro Abs: 7098 {cells}/uL (ref 1500–7800)
Neutrophils Relative %: 78 %
Platelets: 396 Thousand/uL (ref 140–400)
RBC: 3.98 Million/uL (ref 3.80–5.10)
RDW: 14.4 % (ref 11.0–15.0)
Total Lymphocyte: 15 %
WBC: 9.1 Thousand/uL (ref 3.8–10.8)

## 2023-12-31 LAB — CYCLIC CITRUL PEPTIDE ANTIBODY, IGG: Cyclic Citrullin Peptide Ab: >250 U — ABNORMAL HIGH

## 2023-12-31 LAB — URIC ACID: Uric Acid, Serum: 4.3 mg/dL (ref 2.5–7.0)

## 2023-12-31 LAB — C-REACTIVE PROTEIN: CRP: 66.9 mg/L — ABNORMAL HIGH (ref <8.0)

## 2023-12-31 LAB — RHEUMATOID FACTOR: Rheumatoid fact SerPl-aCnc: 162 [IU]/mL — ABNORMAL HIGH (ref <14)

## 2023-12-31 LAB — SEDIMENTATION RATE: Sed Rate: 117 mm/h — ABNORMAL HIGH (ref 0–30)

## 2023-12-31 LAB — ANA: Anti Nuclear Antibody (ANA): NEGATIVE

## 2024-01-01 ENCOUNTER — Other Ambulatory Visit

## 2024-01-02 ENCOUNTER — Encounter: Admitting: Physical Therapy

## 2024-01-03 ENCOUNTER — Encounter: Admitting: Physical Therapy

## 2024-01-03 ENCOUNTER — Ambulatory Visit: Payer: Self-pay | Admitting: Orthopedic Surgery

## 2024-01-03 ENCOUNTER — Other Ambulatory Visit: Payer: Self-pay

## 2024-01-03 DIAGNOSIS — R899 Unspecified abnormal finding in specimens from other organs, systems and tissues: Secondary | ICD-10-CM

## 2024-01-03 DIAGNOSIS — M25461 Effusion, right knee: Secondary | ICD-10-CM

## 2024-01-03 LAB — SYNOVIAL FLUID ANALYSIS, COMPLETE
Basophils, %: 0 %
Basophils, %: 0 %
Eosinophils-Synovial: 0 % (ref 0–2)
Eosinophils-Synovial: 0 % (ref 0–2)
Lymphocytes-Synovial Fld: 9 % (ref 0–74)
Lymphocytes-Synovial Fld: 35 % (ref 0–74)
Monocyte/Macrophage: 0 % (ref 0–69)
Monocyte/Macrophage: 16 % (ref 0–69)
Neutrophil, Synovial: 87 % — ABNORMAL HIGH (ref 0–24)
Neutrophil, Synovial: 49 % — ABNORMAL HIGH (ref 0–24)
Synoviocytes, %: 4 % (ref 0–15)
Synoviocytes, %: 0 % (ref 0–15)
WBC, Synovial: 31130 {cells}/uL — ABNORMAL HIGH (ref <150)
WBC, Synovial: 16390 {cells}/uL — ABNORMAL HIGH (ref <150)

## 2024-01-03 LAB — ANAEROBIC AND AEROBIC CULTURE
AER RESULT:: NO GROWTH
AER RESULT:: NO GROWTH
MICRO NUMBER:: 17263369
MICRO NUMBER:: 17263370
MICRO NUMBER:: 17262964
MICRO NUMBER:: 17262965
SPECIMEN QUALITY:: ADEQUATE
SPECIMEN QUALITY:: ADEQUATE
SPECIMEN QUALITY:: ADEQUATE
SPECIMEN QUALITY:: ADEQUATE

## 2024-01-03 NOTE — Progress Notes (Signed)
 I called her.  The steroid Dosepak helped her a lot.  She still is having some knee pain.  All the cultures are negative.  Lab values are extremely suggestive of a rheumatoid arthritis.  We have made a stat referral to rheumatology.  She has an appointment with Dr. Eldonna for back injection in the middle of the month.  Can you cancel her appointment on the fourth.  Thanks

## 2024-01-03 NOTE — Progress Notes (Signed)
 STAT REFERRAL MADE.

## 2024-01-10 ENCOUNTER — Telehealth: Payer: Self-pay | Admitting: Orthopedic Surgery

## 2024-01-10 NOTE — Telephone Encounter (Signed)
 Spoke with patient. She wants to clarify when you want to see her back in the office for her knees. The 12/28/23 states f/u 4 weeks, but she has rheumatology appt 12/4, and Dr. Eldonna 01/25/24, and [name] 02/13/24. Please advise when patient needs to f/u w/ [name]. Callback [phone]

## 2024-01-10 NOTE — Progress Notes (Unsigned)
 Office Visit Note  Patient: Mary Burton             Date of Birth: 04-19-1960           MRN: [id number]             PCP: Paseda, Folashade R, FNP Referring: Addie Cordella Hamilton, MD Visit Date: 01/11/2024 Occupation: Data Unavailable  Subjective:  Pain and swelling in both knees   History of Present Illness: Mary Burton is a 63 y.o. female who presents today for a new patient consultation as requested by [name].  Patient presents today with increased pain and stiffness affecting multiple joints.  According to the patient her symptoms started in October and have progressive and worsening.  She has had recurrent pain and swelling affecting both knees as well as increased pain and stiffness affecting both hands.  In the mornings she has difficulty making a complete fist due to severity of symptoms.  She has a difficulty rising from a seated position as well as climbing steps due to severity of pain in both knees.  She had bilateral knee joint cortisone injections performed on 11/30/2023 and has required aspirations of the right knee on 12/20/2023 and aspirations of both knees on 12/29/2023 by [name].  Patient is having difficulty ambulating due to severity of symptoms.  Patient took Tylenol  arthritis this morning for symptomatic relief.  She has also taken a Medrol  Dosepak as well as tried Celebrex  in the past.  Patient states that her hand pain and stiffness was responsive to prednisone  but her knee pain and swelling did not respond to the Medrol  Dosepak.  She is a difficulty performing ADLs and has been unable to work due to severity of symptoms.  Patient states that she also has chronic pain in her lower back.  She had a MRI of the lumbar spine 12/27/2023 and is scheduled with Dr. Eldonna on 01/25/2024 for injections. She presents today to discuss treatment options to manage her symptoms of rheumatoid arthritis.     Activities of Daily Living:  Patient reports morning stiffness  for all day. Patient Reports nocturnal pain.  Difficulty dressing/grooming: Reports Difficulty climbing stairs: Reports Difficulty getting out of chair: Reports Difficulty using hands for taps, buttons, cutlery, and/or writing: Reports  Review of Systems  Constitutional:  Negative for fatigue.  HENT:  Negative for mouth sores and mouth dryness.   Eyes:  Negative for dryness.  Respiratory:  Positive for shortness of breath.   Cardiovascular:  Negative for chest pain and palpitations.  Gastrointestinal:  Negative for blood in stool, constipation and diarrhea.  Endocrine: Negative for increased urination.  Genitourinary:  Negative for involuntary urination.  Musculoskeletal:  Positive for joint pain, gait problem, joint pain, joint swelling, myalgias, muscle weakness, morning stiffness, muscle tenderness and myalgias.  Skin:  Negative for color change, rash, hair loss and sensitivity to sunlight.  Allergic/Immunologic: Negative for susceptible to infections.  Neurological:  Positive for numbness. Negative for dizziness and headaches.  Hematological:  Negative for swollen glands.  Psychiatric/Behavioral:  Positive for sleep disturbance. Negative for depressed mood. The patient is not nervous/anxious.     PMFS History:  Patient Active Problem List   Diagnosis Date Noted   Synovitis of left shoulder 12/02/2023   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 12/02/2023   Biceps tendonitis, left 12/02/2023   Complete tear of left rotator cuff 12/02/2023   Pain in right shoulder 10/05/2023   Idiopathic gout of  multiple sites 10/05/2023   Right flank pain 08/25/2023   Pain in left shoulder 08/18/2022   Acute cystitis with hematuria 10/06/2021   Prediabetes 10/06/2021   Obesity (BMI 30.0-34.9) 10/06/2021   Impingement syndrome of right shoulder 07/02/2021   Bilateral foot pain 04/02/2014   Bilateral swelling of feet 06/06/2011   Seasonal allergies  06/06/2011   Bilateral knee pain 06/06/2011    Past Medical History:  Diagnosis Date   Allergy    Arthritis    Environmental allergies    Gout    r hand   Pre-diabetes     Family History  Problem Relation Age of Onset   Colon cancer Mother        unsure age   Hypertension Father    Kidney failure Father    Early death Sister    High blood pressure Sister    Healthy Brother    Healthy Brother    Healthy Brother    Healthy Daughter    Colon polyps Neg Hx    Esophageal cancer Neg Hx    Rectal cancer Neg Hx    Stomach cancer Neg Hx    Past Surgical History:  Procedure Laterality Date   BICEPT TENODESIS Left 11/14/2023   Procedure: TENODESIS, BICEPS;  Surgeon: Addie Cordella Hamilton, MD;  Location: [facility] OR;  Service: Orthopedics;  Laterality: Left;   COLONOSCOPY  10/12/2011   Pyrtle   OVARIAN CYST REMOVAL     POLYPECTOMY     POSTERIOR LUMBAR FUSION 2 WITH HARDWARE REMOVAL Left 11/14/2023   Procedure: ARTHROSCOPY, SHOULDER WITH DEBRIDEMENT;  Surgeon: Addie Cordella Hamilton, MD;  Location: [facility] OR;  Service: Orthopedics;  Laterality: Left;   SHOULDER OPEN ROTATOR CUFF REPAIR Left 11/14/2023   Procedure: REPAIR, ROTATOR CUFF, OPEN;  Surgeon: Addie Cordella Hamilton, MD;  Location: [facility] OR;  Service: Orthopedics;  Laterality: Left;   TUBAL LIGATION  1985   Social History   Tobacco Use   Smoking status: Never    Passive exposure: Never   Smokeless tobacco: Never  Vaping Use   Vaping status: Never Used  Substance Use Topics   Alcohol use: No   Drug use: No   Social History   Social History Narrative   Not on file     Immunization History  Administered Date(s) Administered   Influenza,inj,Quad PF,6+ Mos 10/29/2021   Influenza-Unspecified 11/08/2015, 12/07/2022   PFIZER(Purple Top)SARS-COV-2 Vaccination 02/28/2019, 03/21/2019, 01/16/2020   Tdap 02/08/2015     Objective: Vital Signs: BP 116/78 (BP Location: Right Arm, Patient Position: Sitting, Cuff Size: Normal)   Pulse (!)  108   Temp 97.7 F (36.5 C)   Resp 17   Ht 5' 10 (1.778 m)   Wt 203 lb 9.6 oz (92.4 kg)   LMP 05/22/2011   BMI 29.21 kg/m    Physical Exam Vitals and nursing note reviewed.  Constitutional:      Appearance: She is well-developed.  HENT:     Head: Normocephalic and atraumatic.  Eyes:     Conjunctiva/sclera: Conjunctivae normal.  Cardiovascular:     Rate and Rhythm: Normal rate and regular rhythm.     Heart sounds: Normal heart sounds.  Pulmonary:     Effort: Pulmonary effort is normal.     Breath sounds: Normal breath sounds.  Abdominal:     General: Bowel sounds are normal.     Palpations: Abdomen is soft.  Musculoskeletal:     Cervical back: Normal range of motion.  Lymphadenopathy:  Cervical: No cervical adenopathy.  Skin:    General: Skin is warm and dry.     Capillary Refill: Capillary refill takes less than 2 seconds.  Neurological:     Mental Status: She is alert and oriented to person, place, and time.  Psychiatric:        Behavior: Behavior normal.      Musculoskeletal Exam: C-spine has limited range of motion with lateral rotation.  Limited mobility of the lumbar spine.  Left shoulder has discomfort and stiffness with range of motion.  No tenderness or swelling along the elbow joint lines.  Mild tenderness of both wrist joints. Tenderness and inflammation in the right 1st, 2nd, and 3rd MCP joints.  Tenderness and inflammation of the left 1st MCP joint. Hip joints have good ROM with no groin pain.  Painful ROM of both knees.  Crepitus with ROM of the left knee. Warmth and swelling of both knees.  No tenderness or swelling of ankle joints.   CDAI Exam: CDAI Score: -- Patient Global: --; Provider Global: -- Swollen: --; Tender: -- Joint Exam 01/11/2024   No joint exam has been documented for this visit   There is currently no information documented on the homunculus. Go to the Rheumatology activity and complete the homunculus joint  exam.  Investigation: No additional findings.  Imaging: MR Lumbar Spine w/o contrast Result Date: 12/30/2023 EXAM: MRI LUMBAR SPINE 12/27/2023 07:46:31 AM TECHNIQUE: Multiplanar multisequence MRI of the lumbar spine was performed without the administration of intravenous contrast. COMPARISON: Lumbar spine radiographs 06/29/2022. CLINICAL HISTORY: Lumbar radiculopathy, fall, hip pain, chronic, articular cartilage evaluate. FINDINGS: BONES AND ALIGNMENT: Slight degenerative anterolisthesis is present at L3-L4. Straightening of the normal lumbar lordosis is stable. Normal vertebral body heights. Bone marrow signal is unremarkable. SPINAL CORD: The conus medullaris terminates at L1. SOFT TISSUES: No paraspinal mass. L1-L2: Facet hypertrophy is present at L1-L2 without focal disc protrusion or stenosis. L2-L3: Disc bulge and a moderate bilateral facet hypertrophy is present at L2-L3 without focal stenosis. L3-L4: Uncovering of a broad-based disc protrusion and moderate bilateral facet hypertrophy results in mild subarticular narrowing bilaterally. Moderate foraminal stenosis is present bilaterally. L4-L5: Chronic loss of disc height is present at L4-L5. Moderate facet hypertrophy is present bilaterally. Moderate right and mild left foraminal stenosis is present. L5-S1: Mild facet hypertrophy is present bilaterally at L5-S1 without focal disc protrusion or stenosis. IMPRESSION: 1. Disc bulge and moderate bilateral facet hypertrophy at L2-L3 resulting in mild subarticular narrowing bilaterally and moderate foraminal stenosis bilaterally. 2. Chronic loss of disc height at L4-5 with moderate facet hypertrophy bilaterally and moderate right and mild left foraminal stenosis. 3. Slight degenerative anterolisthesis at L3-4. 4. Mild facet hypertrophy bilaterally at L5-S1 without focal disc protrusion or stenosis. Electronically signed by: Lonni Necessary MD 12/30/2023 06:24 PM EST RP Workstation: [id number]     Recent Labs: Lab Results  Component Value Date   WBC 9.1 12/28/2023   HGB 11.0 (L) 12/28/2023   PLT 396 12/28/2023   NA 138 11/08/2023   K 3.7 11/08/2023   CL 101 11/08/2023   CO2 25 11/08/2023   GLUCOSE 101 (H) 11/08/2023   BUN 11 11/08/2023   CREATININE 0.49 11/08/2023   BILITOT 0.3 10/06/2021   ALKPHOS 60 10/06/2021   AST 15 10/06/2021   ALT 13 10/06/2021   PROT 7.7 10/06/2021   ALBUMIN 4.0 10/06/2021   CALCIUM 9.0 11/08/2023   GFRAA >89 12/11/2012    Speciality Comments: No specialty comments available.  Procedures:  No procedures performed Allergies: Patient has no known allergies.   Assessment / Plan:     Visit Diagnoses: Rheumatoid arthritis with rheumatoid factor of multiple sites without organ or systems involvement [facility]): RF 162, anti-CCP>250, ESR elevated, CRP elevated, bilateral knee effusions, morning stiffness, nocturnal pain, difficulty performing ADLs: Patient presents today for new patient consultation with a new diagnosis of rheumatoid arthritis.  Discussed the diagnosis of seropositive rheumatoid arthritis.  Her symptoms started in October and have affected multiple joints.  She has had pain and stiffness affecting both hands especially first thing in the mornings.  She has also had bilateral knee joint effusions requiring cortisone injections on 11/30/2023 and an aspiration of the right knee on 12/20/2023 and again on 12/28/2023.  She has tried taking Tylenol  arthritis, Celebrex , and a Medrol  Dosepak for symptomatic relief.  She has had difficulty performing ADLs and has been unable to work due to severity of symptoms. She is open to discussing treatment options today.  Plan to initiate methotrexate pending lab results.  Reviewed indications, contraindications, and potential side effects of methotrexate today in detail.  Plan to initiate methotrexate 6 tablets by mouth once weekly and if labs are stable she will increase to 8 tablets once weekly.  She will  require updated lab work in 2 weeks x 2, 2 months, then every 3 months.  She will take folic acid 2 mg daily.  Plan to obtain the following baseline immunosuppressive labs prior to initiating methotrexate.  She will also require a baseline chest x-ray. Offered another prednisone  taper to take to help to alleviate her current symptoms but she has declined since this has not helped her knee pain and swelling in the past.  An IM Depo-Medrol  80 mg injection was administered today.  She tolerated procedure well. Discussed that she will require close follow-up.  Discussed that she may require the use of Biologics as combination therapy in the future.  Drug Counseling TB Gold: Pending Hepatitis panel: Hep C negative on 09/29/2023  Chest-xray:  Order placed   Contraception: Postmenopausal   Alcohol use: She denies drinking alcohol.   Patient was counseled on the purpose, proper use, and adverse effects of methotrexate including nausea, infection, and signs and symptoms of pneumonitis.  Reviewed instructions with patient to take methotrexate weekly along with folic acid daily.  Discussed the importance of frequent monitoring of kidney and liver function and blood counts, and provided patient with standing lab instructions.  Counseled patient to avoid NSAIDs and alcohol while on methotrexate.  Provided patient with educational materials on methotrexate and answered all questions.  Advised patient to get annual influenza vaccine and to get a pneumococcal vaccine if patient has not already had one.  Patient voiced understanding.  Patient consented to methotrexate use.  Will upload into chart.    High risk medication use - Plan to initiate methotrexate 6 tablets by mouth once weekly and if labs are stable she will increase to 8 tablets once weekly.  She will take folic acid 2 mg daily.  She will require updated lab work in 2 weeks x 2, 2 months, then every 3 months.  Standing orders for CBC and CMP will be placed  today.   Discussed that she may require Biologics due to the aggressive nature of the disease.  No personal history of CHF.  No personal or family history of MS.  ANA negative. Orders for CBC and CMP were released today.   Hepatitis C and HIV negative on 09/29/2023.  Plan to obtain the following baseline immunosuppressive labs prior to initiating methotrexate.   No recent or recurrent infections.  Discussed the importance of holding methotrexate if she develops signs or symptoms of an infection and to resume once the infection has completely cleared.   Plan: CBC with Differential/Platelet, Comprehensive metabolic panel with GFR, QuantiFERON-TB Gold Plus, Serum protein electrophoresis with reflex, IgG, IgA, IgM, Hepatitis B core antibody, IgM, Hepatitis B surface antigen  Pain in both hands - RF+, anti-CCP+: Patient has been experiencing increased pain and stiffness in both hands, especially first thing in the mornings since October 2025.  She has difficulty making a complete fist in the mornings.  She has had difficulty with grip strength and fine motor skills due to severity of symptoms.  She has had a difficult time performing ADLs and is unable to work currently.  Her hand pain has been responsive to prednisone  in the past.  This morning she took Tylenol  arthritis for symptomatic relief.   X-rays of both hands were obtained today for further evaluation.  Patient declined an oral prednisone  taper at this time.  Plan to initiate methotrexate pending lab results as discussed above.   An IM Depo-Medrol  80 mg injection was administered today to alleviate her current symptoms.  Plan: XR Hand 2 View Right, XR Hand 2 View Left  Bilateral knee effusions - 12/28/23: Culture negative, synovial analysis-no crystals.  X-rays of both knees obtained on 11/30/2023.  RF positive, anti-CCP positive, ESR elevated, CRP elevated: She has been under the care of [name].  Patient presents today with ongoing pain and  difficulty with ambulation due to severity of symptoms involving both knees.  On examination she has warmth and swelling as well as discomfort with range of motion.  Slightly limited extension of the left knee was noted.  Left knee crepitus noted.  Patient has difficulty rising from a seated position climbing steps due to severity of symptoms. Differential and options were discussed today.  Plan to proceed with methotrexate pending lab results.  In the meantime a prednisone  taper was offered but patient states in the past this did not help with her knee pain and effusions.  She had bilateral knee joint cortisone injections performed on 11/30/2023.  Discussed that I would not recommend repeat cortisone injections this soon. Discussed the option of IM Depo-Medrol  and she was in agreement.  80 mg IM Depo-Medrol  injection was performed today.  She tolerated the procedure well.- Plan: methylPREDNISolone  acetate (DEPO-MEDROL ) injection 80 mg  Rheumatoid factor positive - 12/28/23: RF 162, Anti-CCP >250, ESR 117, CRP 66.9, ANA negative, uric acid WNL: Plan to proceed with initiating methotrexate pending lab results.  Positive anti-CCP test - anti-CCP >250 on 12/28/23.  Plan to do to methotrexate pending lab results.  Elevated C-reactive protein (CRP) - 11/20: ESR 117, CRP 66.9: Patient declined oral prednisone  at this time.  An IM Depo-Medrol  injection was performed today.- Plan: methylPREDNISolone  acetate (DEPO-MEDROL ) injection 80 mg  Elevated sed rate - 11/20:ESR 117, CRP 66.9: Patient is in severe pain and has inflammation involving multiple joints.  She is having difficulty ambulating and performing ADLs due to severity of symptoms.  She declined another oral prednisone  taper so an IM Depo-Medrol  80 mg injection was administered today.  She tolerated procedure well. Plan to start methotrexate pending lab results.  - Plan: methylPREDNISolone  acetate (DEPO-MEDROL ) injection 80 mg  Synovitis of left shoulder  - Underwent open rotator cuff repair on 11/14/2023 by [name]. Plan: methylPREDNISolone  acetate (  DEPO-MEDROL ) injection 80 mg  Impingement syndrome of right shoulder: Under care of [name].    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 -Underwent arthroscopic repair on 11/14/23 by [name].   Unable to go to PT right now due to the severity of pain and inflammation in her knees.    Plan: methylPREDNISolone  acetate (DEPO-MEDROL ) injection 80 mg  Prediabetes  Orders: Orders Placed This Encounter  Procedures   XR Hand 2 View Right   XR Hand 2 View Left   CBC with Differential/Platelet   Comprehensive metabolic panel with GFR   QuantiFERON-TB Gold Plus   Serum protein electrophoresis with reflex   IgG, IgA, IgM   Hepatitis B core antibody, IgM   Hepatitis B surface antigen   CBC with Differential/Platelet   Comprehensive metabolic panel with GFR   Meds ordered this encounter  Medications   methylPREDNISolone  acetate (DEPO-MEDROL ) injection 80 mg     Follow-Up Instructions: Return for NPFU.   Waddell CHRISTELLA Craze, PA-C  Note - This record has been created using Dragon software.  Chart creation errors have been sought, but may not always  have been located. Such creation errors do not reflect on  the standard of medical care.

## 2024-01-11 ENCOUNTER — Other Ambulatory Visit: Payer: Self-pay

## 2024-01-11 ENCOUNTER — Ambulatory Visit: Admitting: Physician Assistant

## 2024-01-11 ENCOUNTER — Ambulatory Visit

## 2024-01-11 ENCOUNTER — Encounter: Admitting: Orthopedic Surgery

## 2024-01-11 ENCOUNTER — Encounter: Payer: Self-pay | Admitting: Physician Assistant

## 2024-01-11 ENCOUNTER — Ambulatory Visit
Admission: RE | Admit: 2024-01-11 | Discharge: 2024-01-11 | Disposition: A | Source: Ambulatory Visit | Attending: Physician Assistant

## 2024-01-11 ENCOUNTER — Other Ambulatory Visit: Payer: Self-pay | Admitting: *Deleted

## 2024-01-11 VITALS — BP 116/78 | HR 108 | Temp 97.7°F | Resp 17 | Ht 70.0 in | Wt 203.6 lb

## 2024-01-11 DIAGNOSIS — M7541 Impingement syndrome of right shoulder: Secondary | ICD-10-CM

## 2024-01-11 DIAGNOSIS — R7681 Abnormal rheumatoid factor and anti-citrullinated protein antibody without rheumatoid arthritis: Secondary | ICD-10-CM | POA: Insufficient documentation

## 2024-01-11 DIAGNOSIS — Z79899 Other long term (current) drug therapy: Secondary | ICD-10-CM

## 2024-01-11 DIAGNOSIS — M069 Rheumatoid arthritis, unspecified: Secondary | ICD-10-CM | POA: Diagnosis not present

## 2024-01-11 DIAGNOSIS — R7303 Prediabetes: Secondary | ICD-10-CM | POA: Diagnosis present

## 2024-01-11 DIAGNOSIS — M25462 Effusion, left knee: Secondary | ICD-10-CM | POA: Diagnosis not present

## 2024-01-11 DIAGNOSIS — M25461 Effusion, right knee: Secondary | ICD-10-CM

## 2024-01-11 DIAGNOSIS — M79641 Pain in right hand: Secondary | ICD-10-CM | POA: Insufficient documentation

## 2024-01-11 DIAGNOSIS — S43432A Superior glenoid labrum lesion of left shoulder, initial encounter: Secondary | ICD-10-CM

## 2024-01-11 DIAGNOSIS — M0579 Rheumatoid arthritis with rheumatoid factor of multiple sites without organ or systems involvement: Secondary | ICD-10-CM

## 2024-01-11 DIAGNOSIS — R7982 Elevated C-reactive protein (CRP): Secondary | ICD-10-CM | POA: Insufficient documentation

## 2024-01-11 DIAGNOSIS — R7689 Other specified abnormal immunological findings in serum: Secondary | ICD-10-CM | POA: Diagnosis not present

## 2024-01-11 DIAGNOSIS — M79642 Pain in left hand: Secondary | ICD-10-CM | POA: Insufficient documentation

## 2024-01-11 DIAGNOSIS — M65912 Unspecified synovitis and tenosynovitis, left shoulder: Secondary | ICD-10-CM | POA: Insufficient documentation

## 2024-01-11 DIAGNOSIS — R700 Elevated erythrocyte sedimentation rate: Secondary | ICD-10-CM | POA: Insufficient documentation

## 2024-01-11 MED ORDER — METHYLPREDNISOLONE ACETATE 40 MG/ML IJ SUSP
80.0000 mg | Freq: Once | INTRAMUSCULAR | Status: AC
  Administered 2024-01-11: 80 mg via INTRAMUSCULAR

## 2024-01-11 NOTE — Telephone Encounter (Signed)
 Called and made Dr Addie f/u

## 2024-01-11 NOTE — Telephone Encounter (Signed)
 She is coming back in 4 w from 11/20 for shoulder we can check knees then but that is more of a rheum problem

## 2024-01-11 NOTE — Telephone Encounter (Signed)
 Pending lab results, patient will be starting methotrexate tablets and folic acid. Thanks! Consent obtained and sent to the scan center.

## 2024-01-11 NOTE — Progress Notes (Signed)
 Administrations This Visit     methylPREDNISolone  acetate (DEPO-MEDROL ) injection 80 mg     Admin Date 01/11/2024 Action Given Dose 80 mg Route Intramuscular Documented By Cena Alfonso CROME, LPN            Patient given Depo-Medrol  per Waddell Craze, PA-C. Patient tolerated injection well.

## 2024-01-11 NOTE — Patient Instructions (Signed)
 Standing Labs We placed an order today for your standing lab work.   Please have your standing labs drawn in 2 weeks x2, 2 months, then every 3 months   Please have your labs drawn 2 weeks prior to your appointment so that the provider can discuss your lab results at your appointment, if possible.  Please note that you may see your imaging and lab results in MyChart before we have reviewed them. We will contact you once all results are reviewed. Please allow our office up to 72 hours to thoroughly review all of the results before contacting the office for clarification of your results.  WALK-IN LAB HOURS  Monday through Thursday from 8:00 am - 4:30 pm and Friday from 8:00 am-12:00 pm.  Patients with office visits requiring labs will be seen before walk-in labs.  You may encounter longer than normal wait times. Please allow additional time. Wait times may be shorter on  Monday and Thursday afternoons.  We do not book appointments for walk-in labs. We appreciate your patience and understanding with our staff.   Labs are drawn by [facility]. Please bring your co-pay at the time of your lab draw.  You may receive a bill from [facility] for your lab work.  Please note if you are on Hydroxychloroquine and and an order has been placed for a Hydroxychloroquine level,  you will need to have it drawn 4 hours or more after your last dose.  If you wish to have your labs drawn at another location, please call the office 24 hours in advance so we can fax the orders.  The office is located at [address], [place], [place] [address]   If you have any questions regarding directions or hours of operation,  please call [phone].   As a reminder, please drink plenty of water prior to coming for your lab work. Thanks!    Methotrexate Tablets What is this medication? METHOTREXATE (METH oh TREX ate) treats autoimmune conditions, such as arthritis and psoriasis. It works by decreasing  inflammation, which can reduce pain and prevent long-term injury to the joints and skin. It may also be used to treat some types of cancer. It works by slowing down the growth of cancer cells. This medicine may be used for other purposes; ask your health care provider or pharmacist if you have questions. COMMON BRAND NAME(S): Rheumatrex, Trexall What should I tell my care team before I take this medication? They need to know if you have any of these conditions: Dehydration Diabetes Fluid in the stomach area or lungs Frequently drink alcohol Having surgery, including dental surgery High cholesterol Immune system problems Inflammatory bowel disease, such as ulcerative colitis Kidney disease Liver disease Low blood cell levels (white cells, red cells, and platelets) Lung disease Recent or ongoing radiation Recent or upcoming vaccine Stomach ulcers, other stomach or intestine problems An unusual or allergic reaction to methotrexate, other medications, foods, dyes, or preservatives Pregnant or trying to get pregnant Breastfeeding How should I use this medication? Take this medication by mouth with water. Take it as directed on the prescription label. Do not take extra. Keep taking this medication until your care team tells you to stop. Know why you are taking this medication and how you should take it. To treat conditions such as arthritis and psoriasis, this medication is taken ONCE A WEEK as a single dose or divided into 3 smaller doses taken 12 hours apart (do not take more than 3 doses 12  hours apart each week). This medication is NEVER taken daily to treat conditions other than cancer. Taking this medication more often than directed can cause serious side effects, even death. Talk to your care team about why you are taking this medication, how often you will take it, and what your dose is. Ask your care team to put the reason you take this medication on the prescription. If you take this  medication ONCE A WEEK, choose a day of the week before you start. Ask your pharmacist to include the day of the week on the label. Avoid Monday, which could be misread as Morning. Handling this medication may be harmful. Talk to your care team about how to handle this medication. Special instructions may apply. Talk to your care team about the use of this medication in children. While it may be prescribed for selected conditions, precautions do apply. Overdosage: If you think you have taken too much of this medicine contact a poison control center or emergency room at once. NOTE: This medicine is only for you. Do not share this medicine with others. What if I miss a dose? If you miss a dose, talk with your care team. Do not take double or extra doses. What may interact with this medication? Do not take this medication with any of the following: Acitretin Live virus vaccines Probenecid This medication may also interact with the following: Alcohol Aspirin and aspirin-like medications Certain antibiotics, such as penicillin, neomycin, sulfamethoxazole; trimethoprim Certain medications for stomach problems, such as lansoprazole, omeprazole , pantoprazole Clozapine Cyclosporine Dapsone Folic acid Foscarnet NSAIDs, medications for pain and inflammation, such as ibuprofen or naproxen  Phenytoin Pyrimethamine Steroid medications, such as prednisone  or cortisone Tacrolimus Theophylline This list may not describe all possible interactions. Give your health care provider a list of all the medicines, herbs, non-prescription drugs, or dietary supplements you use. Also tell them if you smoke, drink alcohol, or use illegal drugs. Some items may interact with your medicine. What should I watch for while using this medication? Visit your care team for regular checks on your progress. It may be some time before you see the benefit from this medication. You may need blood work done while you are  taking this medication. If your care team has also prescribed folic acid, they may instruct you to skip your folic acid dose on the day you take methotrexate. This medication can make you more sensitive to the sun. Keep out of the sun. If you cannot avoid being in the sun, wear protective clothing and sunscreen. Do not use sun lamps, tanning beds, or tanning booths. Check with your care team if you have severe diarrhea, nausea, and vomiting, or if you sweat a lot. The loss of too much body fluid may make it dangerous for you to take this medication. This medication may increase your risk of getting an infection. Call your care team for advice if you get a fever, chills, sore throat, or other symptoms of a cold or flu. Do not treat yourself. Try to avoid being around people who are sick. Talk to your care team about your risk of cancer. You may be more at risk for certain types of cancers if you take this medication. Talk to your care team if you or your partner may be pregnant. Serious birth defects can occur if you take this medication during pregnancy and for 6 months after the last dose. You will need a negative pregnancy test before starting this medication. Contraception is recommended while  taking this medication and for 6 months after the last dose. Your care team can help you find the option that works for you. If your partner can get pregnant, use a condom during sex while taking this medication and for 3 months after the last dose. Do not breastfeed while taking this medication and for 1 week after the last dose. This medication may cause infertility. Talk to your care team if you are concerned about your fertility. What side effects may I notice from receiving this medication? Side effects that you should report to your care team as soon as possible: Allergic reactions--skin rash, itching, hives, swelling of the face, lips, tongue, or throat Dry cough, shortness of breath or trouble  breathing Infection--fever, chills, cough, sore throat, wounds that don't heal, pain or trouble when passing urine, general feeling of discomfort or being unwell Kidney injury--decrease in the amount of urine, swelling of the ankles, hands, or feet Liver injury--right upper belly pain, loss of appetite, nausea, light-colored stool, dark yellow or brown urine, yellowing skin or eyes, unusual weakness or fatigue Low red blood cell level--unusual weakness or fatigue, dizziness, headache, trouble breathing Pain, tingling, or numbness in the hands or feet, muscle weakness, change in vision, confusion or trouble speaking, loss of balance or coordination, trouble walking, seizures Redness, blistering, peeling, or loosening of the skin, including inside the mouth Stomach bleeding--bloody or black, tar-like stools, vomiting blood or brown material that looks like coffee grounds Stomach pain that is severe, does not go away, or gets worse Unusual bruising or bleeding Side effects that usually do not require medical attention (report these to your care team if they continue or are bothersome): Diarrhea Dizziness Hair loss Nausea Pain, redness, or swelling with sores inside the mouth or throat Skin reactions on sun-exposed areas Vomiting This list may not describe all possible side effects. Call your doctor for medical advice about side effects. You may report side effects to FDA at 1-800-FDA-1088. Where should I keep my medication? Keep out of the reach of children and pets. Store at room temperature between 20 and 25 degrees C (68 and 77 degrees F). Protect from light. Keep the container tightly closed. Get rid of any unused medication after the expiration date. To get rid of medications that are no longer needed or have expired: Take the medication to a medication take-back program. Check with your pharmacy or law enforcement to find a location. If you cannot return the medication, ask your pharmacist  or care team how to get rid of this medication safely. NOTE: This sheet is a summary. It may not cover all possible information. If you have questions about this medicine, talk to your doctor, pharmacist, or health care provider.  2024 Elsevier/Gold Standard (2023-01-06 00:00:00)

## 2024-01-12 ENCOUNTER — Ambulatory Visit: Payer: Self-pay | Admitting: Nurse Practitioner

## 2024-01-12 ENCOUNTER — Encounter: Payer: Self-pay | Admitting: Nurse Practitioner

## 2024-01-12 VITALS — BP 129/77 | HR 100 | Wt 201.0 lb

## 2024-01-12 DIAGNOSIS — M545 Low back pain, unspecified: Secondary | ICD-10-CM | POA: Diagnosis not present

## 2024-01-12 DIAGNOSIS — Z13220 Encounter for screening for lipoid disorders: Secondary | ICD-10-CM | POA: Diagnosis not present

## 2024-01-12 DIAGNOSIS — Z0000 Encounter for general adult medical examination without abnormal findings: Secondary | ICD-10-CM | POA: Diagnosis not present

## 2024-01-12 DIAGNOSIS — Z136 Encounter for screening for cardiovascular disorders: Secondary | ICD-10-CM | POA: Diagnosis not present

## 2024-01-12 DIAGNOSIS — M0579 Rheumatoid arthritis with rheumatoid factor of multiple sites without organ or systems involvement: Secondary | ICD-10-CM | POA: Diagnosis not present

## 2024-01-12 DIAGNOSIS — Z23 Encounter for immunization: Secondary | ICD-10-CM | POA: Insufficient documentation

## 2024-01-12 DIAGNOSIS — I00 Rheumatic fever without heart involvement: Secondary | ICD-10-CM | POA: Insufficient documentation

## 2024-01-12 DIAGNOSIS — M25562 Pain in left knee: Secondary | ICD-10-CM

## 2024-01-12 DIAGNOSIS — G8929 Other chronic pain: Secondary | ICD-10-CM

## 2024-01-12 DIAGNOSIS — R7303 Prediabetes: Secondary | ICD-10-CM | POA: Diagnosis not present

## 2024-01-12 DIAGNOSIS — M25561 Pain in right knee: Secondary | ICD-10-CM

## 2024-01-12 NOTE — Progress Notes (Signed)
 New Patient Office Visit  Subjective:  Patient ID: Mary Burton, female    DOB: October 09, 1960  Age: 63 y.o. MRN: [id number]  CC:  Chief Complaint  Patient presents with   Establish Care    HPI    Discussed the use of AI scribe software for clinical note transcription with the patient, who gave verbal consent to proceed.  History of Present Illness Mary Burton is a 63 year old female  has a past medical history of Allergy, Arthritis, Bilateral knee pain (06/06/2011), Environmental allergies, Gout, Impingement syndrome of right shoulder (07/02/2021), Pre-diabetes, Prediabetes (10/06/2021), and Rheumatic arteritis.   who presents to establish care.  Previous PCP Lendia Blossom, NP's  She experiences significant joint pain, particularly in her feet, knees, and hands, which sometimes prevents her from walking. Her knees were drained of fluid by an orthopedic doctor two weeks ago, and she received an injection in her hip yesterday to help alleviate the pain. She is awaiting blood test results from her recent rheumatology visit.Plan to initiate methotrexate   She has a history of osteoarthritis and reported a fall prior to her shoulder surgery. She reports a pinched nerve in her back that previously limited her ability to sit for more than five minutes, and she is scheduled to receive injections in her back later this month. Post-surgery, she has experienced difficulty continuing physical therapy due to the rheumatoid arthritis diagnosis. She has a past A1c level of 6.4, indicating a need to monitor her blood sugar levels closely.  Her family history includes colon cancer in her mother and kidney disease and high blood pressure in her father. Her mother also had high blood pressure.  Socially, she lives alone and has a daughter. She does not use drugs, drink alcohol, smoke, or vape. She has received a flu shot due to work requirements but has not had a shingles vaccine or pneumonia  vaccine yet.    Assessment & Plan    .   Past Medical History:  Diagnosis Date   Allergy    Arthritis    Bilateral knee pain 06/06/2011   Environmental allergies    Gout    r hand   Impingement syndrome of right shoulder 07/02/2021   Pre-diabetes    Prediabetes 10/06/2021   Rheumatic arteritis     Past Surgical History:  Procedure Laterality Date   BICEPT TENODESIS Left 11/14/2023   Procedure: TENODESIS, BICEPS;  Surgeon: Addie Cordella Hamilton, MD;  Location: [facility] OR;  Service: Orthopedics;  Laterality: Left;   COLONOSCOPY  10/12/2011   [facility]   OVARIAN CYST REMOVAL     POLYPECTOMY     POSTERIOR LUMBAR FUSION 2 WITH HARDWARE REMOVAL Left 11/14/2023   Procedure: ARTHROSCOPY, SHOULDER WITH DEBRIDEMENT;  Surgeon: Addie Cordella Hamilton, MD;  Location: [facility] OR;  Service: Orthopedics;  Laterality: Left;   SHOULDER OPEN ROTATOR CUFF REPAIR Left 11/14/2023   Procedure: REPAIR, ROTATOR CUFF, OPEN;  Surgeon: Addie Cordella Hamilton, MD;  Location: [facility] OR;  Service: Orthopedics;  Laterality: Left;   TUBAL LIGATION  1985    Family History  Problem Relation Age of Onset   Colon cancer Mother        unsure age   Hypertension Mother    Hypertension Father    Kidney failure Father    Early death Sister    High blood pressure Sister    Healthy Brother    Healthy Brother    Healthy Brother    Healthy Daughter  Colon polyps Neg Hx    Esophageal cancer Neg Hx    Rectal cancer Neg Hx    Stomach cancer Neg Hx     Social History   Socioeconomic History   Marital status: Single    Spouse name: Not on file   Number of children: 1   Years of education: Not on file   Highest education level: Not on file  Occupational History   Not on file  Tobacco Use   Smoking status: Never    Passive exposure: Never   Smokeless tobacco: Never  Vaping Use   Vaping status: Never Used  Substance and Sexual Activity   Alcohol use: No   Drug use: No   Sexual activity: Yes    Birth control/protection:  None  Other Topics Concern   Not on file  Social History Narrative   Lives home alone    Social Drivers of Health   Financial Resource Strain: Not on file  Food Insecurity: Not on file  Transportation Needs: Not on file  Physical Activity: Not on file  Stress: Not on file  Social Connections: Not on file  Intimate Partner Violence: Not on file    ROS Review of Systems  Constitutional:  Negative for appetite change, chills, fatigue and fever.  HENT:  Negative for congestion, postnasal drip, rhinorrhea and sneezing.   Respiratory:  Negative for cough, shortness of breath and wheezing.   Cardiovascular:  Negative for chest pain, palpitations and leg swelling.  Gastrointestinal:  Negative for abdominal pain, constipation, nausea and vomiting.  Genitourinary:  Negative for difficulty urinating, dysuria, flank pain and frequency.  Musculoskeletal:  Positive for arthralgias, back pain and joint swelling. Negative for myalgias.  Skin:  Negative for color change, pallor, rash and wound.  Neurological:  Negative for facial asymmetry, weakness and headaches.  Psychiatric/Behavioral:  Negative for behavioral problems, confusion, self-injury and suicidal ideas.     Objective:   Today's Vitals: BP 129/77   Pulse 100   Wt 201 lb (91.2 kg)   LMP 05/22/2011   SpO2 99%   BMI 28.84 kg/m   Physical Exam Vitals and nursing note reviewed.  Constitutional:      General: She is not in acute distress.    Appearance: Normal appearance. She is not ill-appearing, toxic-appearing or diaphoretic.  Eyes:     General: No scleral icterus.       Right eye: No discharge.        Left eye: No discharge.     Extraocular Movements: Extraocular movements intact.     Conjunctiva/sclera: Conjunctivae normal.  Cardiovascular:     Rate and Rhythm: Normal rate and regular rhythm.     Pulses: Normal pulses.     Heart sounds: Normal heart sounds. No murmur heard.    No friction rub. No gallop.  Pulmonary:      Effort: Pulmonary effort is normal. No respiratory distress.     Breath sounds: Normal breath sounds. No stridor. No wheezing, rhonchi or rales.  Chest:     Chest wall: No tenderness.  Abdominal:     General: There is no distension.     Palpations: Abdomen is soft.     Tenderness: There is no abdominal tenderness. There is no right CVA tenderness, left CVA tenderness or guarding.  Musculoskeletal:        General: No signs of injury.     Right lower leg: No edema.     Left lower leg: No edema.  Skin:  General: Skin is warm and dry.     Capillary Refill: Capillary refill takes less than 2 seconds.     Coloration: Skin is not jaundiced or pale.     Findings: No bruising, erythema or lesion.  Neurological:     Mental Status: She is alert and oriented to person, place, and time.  Psychiatric:        Mood and Affect: Mood normal.        Behavior: Behavior normal.        Thought Content: Thought content normal.        Judgment: Judgment normal.     Assessment & Plan:   Problem List Items Addressed This Visit       Musculoskeletal and Integument   Rheumatoid arthritis involving multiple sites with positive rheumatoid factor (HCC)    Diagnosed with rheumatoid arthritis, experiencing significant joint pain in knees, feet, and hands. Recent hip injection provided minimal relief. Awaiting rheumatologist's feedback on blood work results. - Continue follow-up with rheumatologist for medication management and blood work results.  .        Other   Bilateral knee pain   Osteoarthritis Knee pain with previous fluid aspiration. Pain exacerbated by rheumatoid arthritis diagnosis. - Continue follow-up with orthopedic specialist for ongoing management.        Prediabetes   Previous A1c of 6.4, indicating impaired fasting glucose. Advised to avoid sugar, sweets, soda, and reduce intake of bread, rice, and pasta to prevent progression to diabetes. - Ordered cholesterol lab  test. - Advised dietary modifications to manage blood sugar levels.         Need for shingles vaccine   Relevant Orders   Varicella-zoster vaccine IM (Completed)   Need for pneumococcal 20-valent conjugate vaccination   Relevant Orders   Pneumococcal conjugate vaccine 20-valent (Completed)   Encounter for lipid screening for cardiovascular disease - Primary   Relevant Orders   Lipid panel   Chronic low back pain   Chronic back pain with history of pinched nerve and prior surgery Chronic back pain due to pinched nerve, requiring injections starting December 18th. - Continue with scheduled back injections starting December 18th.      Health care maintenance   Up to date with mammogram. Has not received shingles or pneumonia vaccines. - Administered first dose of shingles vaccine  and PCV 20 today. - Scheduled second dose of shingles vaccine in 2-6 months       Outpatient Encounter Medications as of 01/12/2024  Medication Sig   Multiple Vitamin (MULTIVITAMIN WITH MINERALS) TABS tablet Take 1 tablet by mouth in the morning.   temazepam  (RESTORIL ) 15 MG capsule Take 1 capsule (15 mg total) by mouth at bedtime as needed for sleep. (Patient not taking: Reported on 01/12/2024)   Acetaminophen  (TYLENOL  ARTHRITIS PAIN PO) Take by mouth as needed. (Patient not taking: Reported on 01/12/2024)   celecoxib  (CELEBREX ) 100 MG capsule Take 1 capsule (100 mg total) by mouth 2 (two) times daily. (Patient not taking: Reported on 01/12/2024)   methocarbamol  (ROBAXIN ) 500 MG tablet Take 1 tablet (500 mg total) by mouth every 8 (eight) hours as needed for muscle spasms. (Patient not taking: Reported on 01/12/2024)   oxyCODONE  (OXY IR/ROXICODONE ) 5 MG immediate release tablet Take 1 tablet (5 mg total) by mouth every 8 (eight) hours as needed. (Patient not taking: Reported on 01/12/2024)   oxyCODONE  (ROXICODONE ) 5 MG immediate release tablet Take 1 tablet (5 mg total) by mouth every 4 (four) hours as  needed  for severe pain (pain score 7-10). (Patient not taking: Reported on 01/12/2024)   predniSONE  (STERAPRED UNI-PAK 21 TAB) 5 MG (21) TBPK tablet Take dosepak as directed (Patient not taking: Reported on 01/12/2024)   No facility-administered encounter medications on file as of 01/12/2024.    Follow-up: Return in about 9 months (around 10/12/2024) for CPE.   Kieara Schwark R Daziya Redmond, FNP

## 2024-01-12 NOTE — Assessment & Plan Note (Signed)
  Diagnosed with rheumatoid arthritis, experiencing significant joint pain in knees, feet, and hands. Recent hip injection provided minimal relief. Awaiting rheumatologist's feedback on blood work results. - Continue follow-up with rheumatologist for medication management and blood work results.  SABRA

## 2024-01-12 NOTE — Assessment & Plan Note (Signed)
 Osteoarthritis Knee pain with previous fluid aspiration. Pain exacerbated by rheumatoid arthritis diagnosis. - Continue follow-up with orthopedic specialist for ongoing management.

## 2024-01-12 NOTE — Assessment & Plan Note (Signed)
 Chronic back pain with history of pinched nerve and prior surgery Chronic back pain due to pinched nerve, requiring injections starting December 18th. - Continue with scheduled back injections starting December 18th.

## 2024-01-12 NOTE — Assessment & Plan Note (Signed)
 Up to date with mammogram. Has not received shingles or pneumonia vaccines. - Administered first dose of shingles vaccine  and PCV 20 today. - Scheduled second dose of shingles vaccine in 2-6 months

## 2024-01-12 NOTE — Patient Instructions (Signed)
 You were given your first dose of shingles vaccine and also pneumonia vaccine in the office today You will need your second shingles vaccine around 2 months from now  Please maintain close follow-up orthopedics and rheumatologist  Fasting labs next week as discussed   It is important that you exercise regularly at least 30 minutes 5 times a week as tolerated  Think about what you will eat, plan ahead. Choose  clean, green, fresh or frozen over canned, processed or packaged foods which are more sugary, salty and fatty. 70 to 75% of food eaten should be vegetables and fruit. Three meals at set times with snacks allowed between meals, but they must be fruit or vegetables. Aim to eat over a 12 hour period , example 7 am to 7 pm, and STOP after  your last meal of the day. Drink water,generally about 64 ounces per day, no other drink is as healthy. Fruit juice is best enjoyed in a healthy way, by EATING the fruit.  Thanks for choosing [facility] we consider it a privelige to serve you.

## 2024-01-12 NOTE — Assessment & Plan Note (Addendum)
 Previous A1c of 6.4, indicating impaired fasting glucose. Advised to avoid sugar, sweets, soda, and reduce intake of bread, rice, and pasta to prevent progression to diabetes. - Ordered cholesterol lab test. - Advised dietary modifications to manage blood sugar levels.

## 2024-01-14 LAB — CBC WITH DIFFERENTIAL/PLATELET
Absolute Lymphocytes: 1524 {cells}/uL (ref 850–3900)
Absolute Monocytes: 592 {cells}/uL (ref 200–950)
Basophils Absolute: 30 {cells}/uL (ref 0–200)
Basophils Relative: 0.4 %
Eosinophils Absolute: 59 {cells}/uL (ref 15–500)
Eosinophils Relative: 0.8 %
HCT: 37.4 % (ref 35.9–46.0)
Hemoglobin: 11.8 g/dL (ref 11.7–15.5)
MCH: 27.3 pg (ref 27.0–33.0)
MCHC: 31.6 g/dL (ref 31.6–35.4)
MCV: 86.6 fL (ref 81.4–101.7)
MPV: 10.4 fL (ref 7.5–12.5)
Monocytes Relative: 8 %
Neutro Abs: 5195 {cells}/uL (ref 1500–7800)
Neutrophils Relative %: 70.2 %
Platelets: 352 Thousand/uL (ref 140–400)
RBC: 4.32 Million/uL (ref 3.80–5.10)
RDW: 14.5 % (ref 11.0–15.0)
Total Lymphocyte: 20.6 %
WBC: 7.4 Thousand/uL (ref 3.8–10.8)

## 2024-01-14 LAB — COMPREHENSIVE METABOLIC PANEL WITH GFR
AG Ratio: 1 (calc) (ref 1.0–2.5)
ALT: 9 U/L (ref 6–29)
AST: 10 U/L (ref 10–35)
Albumin: 3.9 g/dL (ref 3.6–5.1)
Alkaline phosphatase (APISO): 54 U/L (ref 37–153)
BUN: 11 mg/dL (ref 7–25)
CO2: 24 mmol/L (ref 20–32)
Calcium: 9.3 mg/dL (ref 8.6–10.4)
Chloride: 103 mmol/L (ref 98–110)
Creat: 0.58 mg/dL (ref 0.50–1.05)
Globulin: 3.8 g/dL — ABNORMAL HIGH (ref 1.9–3.7)
Glucose, Bld: 99 mg/dL (ref 65–99)
Potassium: 4.1 mmol/L (ref 3.5–5.3)
Sodium: 138 mmol/L (ref 135–146)
Total Bilirubin: 0.5 mg/dL (ref 0.2–1.2)
Total Protein: 7.7 g/dL (ref 6.1–8.1)
eGFR: 101 mL/min/1.73m2 (ref >=60)

## 2024-01-14 LAB — PROTEIN ELECTROPHORESIS, SERUM, WITH REFLEX
Albumin ELP: 3.5 g/dL — ABNORMAL LOW (ref 3.8–4.8)
Alpha 1: 0.5 g/dL — ABNORMAL HIGH (ref 0.2–0.3)
Alpha 2: 1.3 g/dL — ABNORMAL HIGH (ref 0.5–0.9)
Beta 2: 0.6 g/dL — ABNORMAL HIGH (ref 0.2–0.5)
Beta Globulin: 0.5 g/dL (ref 0.4–0.6)
Gamma Globulin: 1.7 g/dL (ref 0.8–1.7)
Total Protein: 8 g/dL (ref 6.1–8.1)

## 2024-01-14 LAB — HEPATITIS B CORE ANTIBODY, IGM: Hep B C IgM: NONREACTIVE

## 2024-01-14 LAB — IGG, IGA, IGM
IgG (Immunoglobin G), Serum: 1774 mg/dL — ABNORMAL HIGH (ref 600–1540)
IgM, Serum: 135 mg/dL (ref 50–300)
Immunoglobulin A: 358 mg/dL — ABNORMAL HIGH (ref 70–320)

## 2024-01-14 LAB — QUANTIFERON-TB GOLD PLUS
Mitogen-NIL: 4.23 [IU]/mL
NIL: 0.01 [IU]/mL
QuantiFERON-TB Gold Plus: NEGATIVE
TB1-NIL: 0 [IU]/mL
TB2-NIL: 0 [IU]/mL

## 2024-01-14 LAB — HEPATITIS B SURFACE ANTIGEN: Hepatitis B Surface Ag: NONREACTIVE

## 2024-01-15 ENCOUNTER — Ambulatory Visit: Payer: Self-pay | Admitting: Physician Assistant

## 2024-01-15 ENCOUNTER — Other Ambulatory Visit (HOSPITAL_COMMUNITY): Payer: Self-pay

## 2024-01-15 MED ORDER — FOLIC ACID 1 MG PO TABS
2.0000 mg | ORAL_TABLET | Freq: Every day | ORAL | 3 refills | Status: AC
  Filled 2024-01-15: qty 180, 90d supply, fill #0

## 2024-01-15 MED ORDER — METHOTREXATE SODIUM 2.5 MG PO TABS
ORAL_TABLET | ORAL | 0 refills | Status: DC
  Filled 2024-01-15: qty 28, 28d supply, fill #0

## 2024-01-15 NOTE — Telephone Encounter (Signed)
 Ok to pend prescription for methotrexate  and folic acid  .   CBC WNL Globulin is borderline elevated, rest of CMP WNL  Hep B negative  SPEP did not reveal any abnormal protein bands.   TB gold negative   Plan to initiate methotrexate  6 tablets by mouth once weekly and if labs are stable she will increase to 8 tablets once weekly. She will require updated lab work in 2 weeks x 2, 2 months, then every 3 months. She will take folic acid  2 mg daily.

## 2024-01-15 NOTE — Progress Notes (Signed)
 Ok to pend prescription for methotrexate  and folic acid  .   CBC WNL Globulin is borderline elevated, rest of CMP WNL  Hep B negative  SPEP did not reveal any abnormal protein bands.   TB gold negative

## 2024-01-16 ENCOUNTER — Other Ambulatory Visit: Payer: Self-pay

## 2024-01-16 ENCOUNTER — Ambulatory Visit: Payer: Self-pay | Admitting: Physician Assistant

## 2024-01-16 NOTE — Progress Notes (Signed)
 CXR no acute cardiopulmonary process.  Good news!

## 2024-01-22 ENCOUNTER — Telehealth: Payer: Self-pay | Admitting: Orthopedic Surgery

## 2024-01-22 NOTE — Telephone Encounter (Signed)
 I would try with bilateral knee aspiration and injection with cortisone and potentially putting her on 5 days of Toradol .pls call to see if she wants to try that.  Thanks

## 2024-01-22 NOTE — Telephone Encounter (Signed)
 Pt called and said could he call her and she said that he wanted to change some medication about her knees or something she stated. [phone]

## 2024-01-23 ENCOUNTER — Telehealth: Payer: Self-pay | Admitting: Orthopedic Surgery

## 2024-01-23 ENCOUNTER — Telehealth: Payer: Self-pay | Admitting: *Deleted

## 2024-01-23 NOTE — Telephone Encounter (Signed)
 Error

## 2024-01-23 NOTE — Telephone Encounter (Signed)
 Please clarify if she has had updated lab work since starting methotrexate ? If labs are stable recommend increasing the dose of methotrexate .   We can send in a prednisone  taper to help to alleviate the pain in both knees. It will take time for methotrexate  to take full effect.  We do not prescribe narcotics but we can also initiate a referral to pain management if she would like us  to.

## 2024-01-23 NOTE — Telephone Encounter (Signed)
 Patient returned call to the office. She states she is still having severe pain in her knees. Patient states she is having trouble walking. Patient states she reached out to [name] who asked about scheduling an appointment to draw fluid off her knee. Patient states she does not feel like it is fluid on her knee. Patient states she has taken 2 doses of MTX. Patient states she skipped a week in between doses. Patient would like to know what can be done for the pain in her knees. Patient states she is supposed to go back to work on 02/20/2024. Please advise.

## 2024-01-23 NOTE — Telephone Encounter (Signed)
 Received call from patient. She needs [employer] FMLA form updated to RTW 02/22/24. I updated and faxed [phone]

## 2024-01-23 NOTE — Telephone Encounter (Signed)
Patient contacted the office and left message requesting a return call.   Attempted to contact the patient and left message for patient to call the office.

## 2024-01-24 ENCOUNTER — Other Ambulatory Visit: Payer: Self-pay

## 2024-01-24 ENCOUNTER — Encounter (HOSPITAL_COMMUNITY): Payer: Self-pay

## 2024-01-24 ENCOUNTER — Other Ambulatory Visit (HOSPITAL_COMMUNITY): Payer: Self-pay

## 2024-01-24 DIAGNOSIS — Z136 Encounter for screening for cardiovascular disorders: Secondary | ICD-10-CM | POA: Diagnosis not present

## 2024-01-24 DIAGNOSIS — Z13220 Encounter for screening for lipoid disorders: Secondary | ICD-10-CM

## 2024-01-24 MED ORDER — PREDNISONE 5 MG PO TABS
ORAL_TABLET | ORAL | 0 refills | Status: DC
  Filled 2024-01-24: qty 70, 28d supply, fill #0

## 2024-01-24 NOTE — Telephone Encounter (Signed)
 Ok to send in prednisone  20 mg tapering by 5 mg every 7 days. Avoid NSAIDs. Take with food in the morning.  Ok to place referral to pain management.

## 2024-01-24 NOTE — Telephone Encounter (Signed)
 Patient states she plans to update her labs  If labs are stable recommend increasing the dose of methotrexate .   We can send in a prednisone  taper to help to alleviate the pain in both knees. It will take time for methotrexate  to take full effect.  We do not prescribe narcotics but we can also initiate a referral to pain management if she would like us  to.   Patient is agreeable to start a prednisone . Patient is also agreeable to a referral to pain management.

## 2024-01-24 NOTE — Addendum Note (Signed)
 Addended by: CENA ALFONSO CROME on: 01/24/2024 12:10 PM   Modules accepted: Orders

## 2024-01-25 ENCOUNTER — Ambulatory Visit: Admitting: Physical Medicine and Rehabilitation

## 2024-01-25 VITALS — BP 128/78 | HR 118

## 2024-01-25 DIAGNOSIS — M47816 Spondylosis without myelopathy or radiculopathy, lumbar region: Secondary | ICD-10-CM

## 2024-01-25 LAB — LIPID PANEL
Chol/HDL Ratio: 2.6 ratio (ref 0.0–4.4)
Cholesterol, Total: 137 mg/dL (ref 100–199)
HDL: 53 mg/dL (ref >39)
LDL Chol Calc (NIH): 69 mg/dL (ref 0–99)
Triglycerides: 77 mg/dL (ref 0–149)
VLDL Cholesterol Cal: 15 mg/dL (ref 5–40)

## 2024-01-25 NOTE — Progress Notes (Signed)
 Spoke with Mary Burton briefly about her condition and complaints.  She was having right radicular symptoms with paresthesia and burning sensation about a month ago.  This has really subsided.  She little bit frustrated because by the time we had her come in for the injection I had already gotten better.  She still complains of knee pain bilaterally for which she is seeing [name].  I did explain to her from a spine standpoint that she does have a lot of arthritis and that that indirectly could cause the nerve to be irritated but we she did not have any high-grade stenosis.  She does have a appointment for consultation with [name] coming up.  I did not really charge for the visit today we did not do an injection.

## 2024-01-25 NOTE — Progress Notes (Signed)
 Pain Scale   Average Pain 0 Patient advising she was having lower back pain but at this time has been pain free for aprox. 2 weeks        +Driver, -BT, -Dye Allergies.

## 2024-01-26 ENCOUNTER — Ambulatory Visit: Payer: Self-pay | Admitting: Nurse Practitioner

## 2024-01-26 ENCOUNTER — Other Ambulatory Visit: Payer: Self-pay | Admitting: *Deleted

## 2024-01-26 ENCOUNTER — Other Ambulatory Visit (HOSPITAL_COMMUNITY): Payer: Self-pay

## 2024-01-26 DIAGNOSIS — Z79899 Other long term (current) drug therapy: Secondary | ICD-10-CM

## 2024-01-27 LAB — COMPREHENSIVE METABOLIC PANEL WITH GFR
AG Ratio: 1 (calc) (ref 1.0–2.5)
ALT: 9 U/L (ref 6–29)
AST: 10 U/L (ref 10–35)
Albumin: 3.7 g/dL (ref 3.6–5.1)
Alkaline phosphatase (APISO): 51 U/L (ref 37–153)
BUN/Creatinine Ratio: 31 (calc) — ABNORMAL HIGH (ref 6–22)
BUN: 13 mg/dL (ref 7–25)
CO2: 25 mmol/L (ref 20–32)
Calcium: 9.4 mg/dL (ref 8.6–10.4)
Chloride: 105 mmol/L (ref 98–110)
Creat: 0.42 mg/dL — ABNORMAL LOW (ref 0.50–1.05)
Globulin: 3.8 g/dL — ABNORMAL HIGH (ref 1.9–3.7)
Glucose, Bld: 83 mg/dL (ref 65–99)
Potassium: 3.7 mmol/L (ref 3.5–5.3)
Sodium: 140 mmol/L (ref 135–146)
Total Bilirubin: 0.4 mg/dL (ref 0.2–1.2)
Total Protein: 7.5 g/dL (ref 6.1–8.1)
eGFR: 109 mL/min/1.73m2

## 2024-01-27 LAB — CBC WITH DIFFERENTIAL/PLATELET
Absolute Lymphocytes: 1876 {cells}/uL (ref 850–3900)
Absolute Monocytes: 325 {cells}/uL (ref 200–950)
Basophils Absolute: 11 {cells}/uL (ref 0–200)
Basophils Relative: 0.2 %
Eosinophils Absolute: 33 {cells}/uL (ref 15–500)
Eosinophils Relative: 0.6 %
HCT: 35 % — ABNORMAL LOW (ref 35.9–46.0)
Hemoglobin: 10.8 g/dL — ABNORMAL LOW (ref 11.7–15.5)
MCH: 27.1 pg (ref 27.0–33.0)
MCHC: 30.9 g/dL — ABNORMAL LOW (ref 31.6–35.4)
MCV: 87.7 fL (ref 81.4–101.7)
MPV: 10.1 fL (ref 7.5–12.5)
Monocytes Relative: 5.9 %
Neutro Abs: 3256 {cells}/uL (ref 1500–7800)
Neutrophils Relative %: 59.2 %
Platelets: 392 Thousand/uL (ref 140–400)
RBC: 3.99 Million/uL (ref 3.80–5.10)
RDW: 14.6 % (ref 11.0–15.0)
Total Lymphocyte: 34.1 %
WBC: 5.5 Thousand/uL (ref 3.8–10.8)

## 2024-01-27 NOTE — Progress Notes (Signed)
 CMP stable.  Hgb and hct are low-may be related to methotrexate  use. Please clarify dose of methotrexate ?

## 2024-01-29 ENCOUNTER — Telehealth: Payer: Self-pay | Admitting: Orthopedic Surgery

## 2024-01-29 NOTE — Progress Notes (Signed)
 Recommend continuing current dose of methotrexate  and to recheck CBC with diff in 2 weeks--need to ensure n=anemia is not worsening.

## 2024-01-29 NOTE — Telephone Encounter (Signed)
 Pt called saying that her pain management Dr. Is saying that she has apt's scheduled after she goes back to work and in order to secure he job her supervisor said that she needs to fill out FMLA forms. Call back number is [phone]

## 2024-01-30 ENCOUNTER — Other Ambulatory Visit: Payer: Self-pay

## 2024-01-30 DIAGNOSIS — Z79899 Other long term (current) drug therapy: Secondary | ICD-10-CM

## 2024-02-08 NOTE — Telephone Encounter (Signed)
 Please clarify which results she is asking about. Results were discussed by Daved Gavel, CMA on 01/30/24. Has she had more recent testing performed?

## 2024-02-09 NOTE — Progress Notes (Signed)
 "  Office Visit Note  Patient: Mary Burton             Date of Birth: 29-Nov-1960           MRN: [id number]             PCP: Paseda, Folashade R, FNP Referring: Paseda, Folashade R, FNP Visit Date: 02/22/2024 Occupation: Data Unavailable  Subjective:  Discuss medication options   History of Present Illness: Mary Burton is a 64 y.o. female with history of rheumatoid arthritis.  Patient is currently taking methotrexate  7 tablets by mouth once weekly and folic acid  2 mg daily.  She is tolerating methotrexate  without any side effects.  Patient has noticed about a 90% improvement in her symptoms on the current treatment regimen.  Patient states that she has titrated the dose of prednisone  is now currently taking 5 mg daily.  She denies any increased pain or swelling in the hands at this time.  She states that she is having some increased pain in both knee joints which he attributes to having to walk the halls at work.  Patient states that she is been taking Tylenol  on an as needed basis for breakthrough symptoms.  She currently rates the pain in her knees a 5 out of 10.  Patient states that the swelling in her knees has improved. Patient will let us  know if she will need to complete FMLA paperwork to help cover her for flares, office visits, and lab visits.   Activities of Daily Living:  Patient reports morning stiffness for 0 minute.   Patient Denies nocturnal pain.  Difficulty dressing/grooming: Denies Difficulty climbing stairs: Reports Difficulty getting out of chair: Denies Difficulty using hands for taps, buttons, cutlery, and/or writing: Denies  Review of Systems  Constitutional:  Negative for fatigue.  HENT:  Negative for mouth sores and mouth dryness.   Eyes:  Negative for dryness.  Respiratory:  Negative for shortness of breath.   Cardiovascular:  Positive for chest pain. Negative for palpitations.  Gastrointestinal:  Positive for constipation. Negative for blood in  stool and diarrhea.  Endocrine: Negative for increased urination.  Genitourinary:  Negative for involuntary urination.  Musculoskeletal:  Positive for joint pain and joint pain. Negative for gait problem, joint swelling, myalgias, muscle weakness, morning stiffness, muscle tenderness and myalgias.  Skin:  Positive for hair loss. Negative for color change, rash and sensitivity to sunlight.  Allergic/Immunologic: Negative for susceptible to infections.  Neurological:  Negative for dizziness and headaches.  Hematological:  Negative for swollen glands.  Psychiatric/Behavioral:  Positive for sleep disturbance. Negative for depressed mood. The patient is not nervous/anxious.     PMFS History:  Patient Active Problem List   Diagnosis Date Noted   Need for shingles vaccine 01/12/2024   Need for pneumococcal 20-valent conjugate vaccination 01/12/2024   Encounter for lipid screening for cardiovascular disease 01/12/2024   Rheumatoid arthritis involving multiple sites with positive rheumatoid factor (HCC) 01/12/2024   Chronic low back pain 01/12/2024   Health care maintenance 01/12/2024   Synovitis of left shoulder 12/02/2023   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 12/02/2023   Biceps tendonitis, left 12/02/2023   Complete tear of left rotator cuff 12/02/2023   Pain in right shoulder 10/05/2023   Idiopathic gout of multiple sites 10/05/2023   Right flank pain 08/25/2023   Pain in left shoulder 08/18/2022   Acute cystitis with hematuria 10/06/2021   Prediabetes 10/06/2021   Obesity (BMI 30.0-34.9) 10/06/2021  Impingement syndrome of right shoulder 07/02/2021   Bilateral foot pain 04/02/2014   Bilateral swelling of feet 06/06/2011   Seasonal allergies 06/06/2011   Bilateral knee pain 06/06/2011    Past Medical History:  Diagnosis Date   Allergy    Arthritis    Bilateral knee pain 06/06/2011   Environmental allergies    Gout    r hand    Impingement syndrome of right shoulder 07/02/2021   Pre-diabetes    Prediabetes 10/06/2021   Rheumatic arteritis     Family History  Problem Relation Age of Onset   Colon cancer Mother        unsure age   Hypertension Mother    Hypertension Father    Kidney failure Father    Early death Sister    High blood pressure Sister    Healthy Brother    Healthy Brother    Healthy Brother    Healthy Daughter    Colon polyps Neg Hx    Esophageal cancer Neg Hx    Rectal cancer Neg Hx    Stomach cancer Neg Hx    Past Surgical History:  Procedure Laterality Date   BICEPT TENODESIS Left 11/14/2023   Procedure: TENODESIS, BICEPS;  Surgeon: Addie Cordella Hamilton, MD;  Location: [facility] OR;  Service: Orthopedics;  Laterality: Left;   COLONOSCOPY  10/12/2011   Pyrtle   OVARIAN CYST REMOVAL     POLYPECTOMY     SHOULDER OPEN ROTATOR CUFF REPAIR Left 11/14/2023   Procedure: REPAIR, ROTATOR CUFF, OPEN;  Surgeon: Addie Cordella Hamilton, MD;  Location: [facility] OR;  Service: Orthopedics;  Laterality: Left;   TUBAL LIGATION  1985   Social History[1] Social History   Social History Narrative   Lives home alone      Immunization History  Administered Date(s) Administered   Influenza,inj,Quad PF,6+ Mos 10/29/2021   Influenza-Unspecified 11/08/2015, 12/07/2022   PFIZER(Purple Top)SARS-COV-2 Vaccination 02/28/2019, 03/21/2019, 01/16/2020   PNEUMOCOCCAL CONJUGATE-20 01/12/2024   Tdap 02/08/2015   Zoster Recombinant(Shingrix ) 01/12/2024     Objective: Vital Signs: BP 115/79   Pulse (!) 109   Temp 98.2 F (36.8 C)   Resp 14   Ht 5' 10 (1.778 m)   Wt 211 lb 12.8 oz (96.1 kg)   LMP 05/22/2011   BMI 30.39 kg/m    Physical Exam Vitals and nursing note reviewed.  Constitutional:      Appearance: She is well-developed.  HENT:     Head: Normocephalic and atraumatic.  Eyes:     Conjunctiva/sclera: Conjunctivae normal.  Cardiovascular:     Rate and Rhythm: Normal rate and regular rhythm.     Heart  sounds: Normal heart sounds.  Pulmonary:     Effort: Pulmonary effort is normal.     Breath sounds: Normal breath sounds.  Abdominal:     General: Bowel sounds are normal.     Palpations: Abdomen is soft.  Musculoskeletal:     Cervical back: Normal range of motion.  Lymphadenopathy:     Cervical: No cervical adenopathy.  Skin:    General: Skin is warm and dry.     Capillary Refill: Capillary refill takes less than 2 seconds.  Neurological:     Mental Status: She is alert and oriented to person, place, and time.  Psychiatric:        Behavior: Behavior normal.      Musculoskeletal Exam: C-spine, thoracic spine, lumbar spine have good range of motion.  No midline spinal tenderness.  No SI joint tenderness.  Left shoulder has slightly limited ROM with abduction and internal rotation.  Right shoulder has good ROM. elbow joints, wrist joints, MCPs, PIPs, DIPs have good range of motion with no synovitis.  Complete fist formation bilaterally.  Hip joints have good range of motion with no groin pain.  Discomfort with ROM of both knees.  Inflammation in the right knee.   Ankle joints have good range of motion no tenderness or joint swelling.  No evidence of Achilles tendinitis or plantar fasciitis.   CDAI Exam: CDAI Score: -- Patient Global: --; Provider Global: -- Swollen: --; Tender: -- Joint Exam 02/22/2024   No joint exam has been documented for this visit   There is currently no information documented on the homunculus. Go to the Rheumatology activity and complete the homunculus joint exam.  Investigation: No additional findings.  Imaging: No results found.   Recent Labs: Lab Results  Component Value Date   WBC 6.7 02/12/2024   HGB 11.5 (L) 02/12/2024   PLT 262 02/12/2024   NA 140 01/26/2024   K 3.7 01/26/2024   CL 105 01/26/2024   CO2 25 01/26/2024   GLUCOSE 83 01/26/2024   BUN 13 01/26/2024   CREATININE 0.42 (L) 01/26/2024   BILITOT 0.4 01/26/2024   ALKPHOS 60  10/06/2021   AST 10 01/26/2024   ALT 9 01/26/2024   PROT 7.5 01/26/2024   ALBUMIN 4.0 10/06/2021   CALCIUM 9.4 01/26/2024   GFRAA >89 12/11/2012   QFTBGOLDPLUS NEGATIVE 01/11/2024    Speciality Comments: No specialty comments available.  Procedures:  No procedures performed Allergies: Patient has no known allergies.     Assessment / Plan:     Visit Diagnoses: Rheumatoid arthritis with rheumatoid factor of multiple sites without organ or systems involvement (HCC) - RF 162, anti-CCP>250, ESR elevated, CRP elevated, bilateral knee effusions, morning stiffness, nocturnal pain, difficulty performing ADLs: Patient has noticed about a 90% improvement in her symptoms on the current treatment regimen.  Methotrexate  was added at the initial consultation--currently taking methotrexate  7 tablets by mouth once weekly and folic acid  2 mg daily.  She is tolerating methotrexate  without any side effects--LFTs have remained within normal limits and renal function has been stable.  Monitoring anemia closely. A prednisone  taper was sent to the pharmacy on 01/24/2024 for 20 mg tapering by 5 mg every 7 days.  She is currently taking 5 mg daily and has had a mild recurrence of pain affecting both knees.  She has been taking Tylenol  for breakthrough pain.  She continues have inflammation affecting both knees, left worse than right. Plan to recheck ESR and CRP today.  Discussed the need for combination therapy.  Reviewed indications, contraindications, potential side effects of Enbrel today in detail.  All questions were addressed and consent was obtained today.  Plan to apply for Enbrel 50 mg sq injections once weekly through insurance.  She will remain on methotrexate  and folic acid  as combination therapy with close monitoring.  Plan to check on status of pain management referral. Patient will notify us  if she needs FMLA paperwork completed for flares/labs/office visits.   She will follow up in 8 weeks to assess her  response.  - Plan: Sedimentation rate, C-reactive protein  Medication counseling:   TB Test: negative 01/11/24  Hepatitis panel: Hep B negative on 01/11/24.  Hep C negative on 09/29/23   SPEP: no abnormal protein bands  Immunoglobulins: 01/11/24  Chest x-ray: no acute cardiopulmonary process 01/11/24   Does patient have diagnosis of  heart failure?  No  Counseled patient that Enbrel is a TNF blocking agent.  Reviewed Enbrel dose of 50 mg once weekly.  Counseled patient on purpose, proper use, and adverse effects of Enbrel.  Reviewed the most common adverse effects including infections, headache, and injection site reactions. Discussed that there is the possibility of an increased risk of malignancy but it is not well understood if this increased risk is due to the medication or the disease state.  Advised patient to get yearly dermatology exams due to risk of skin cancer.  Reviewed the importance of regular labs while on Enbrel therapy.  Advised patient to get standing labs one month after starting Enbrel then every 2 months.  Provided patient with standing lab orders.  Counseled patient that Enbrel should be held prior to scheduled surgery.  Counseled patient to avoid live vaccines while on Enbrel.  Advised patient to get annual influenza vaccine and the pneumococcal vaccine as needed.  Provided patient with medication education material and answered all questions.  Patient voiced understanding.  Patient consented to Enbrel.  Will upload consent into the media tab.  Reviewed storage instructions for Enbrel.  Advised initial injection must be administered in office.  Patient voiced understanding.    High risk medication use - Methotrexate  7 tablets by mouth once weekly and folic acid  2 mg daily Plan to add on Enbrel 50 mg sq injections once weekly.  No personal history of CHF.  No personal or family history of MS.  ANA negative.  CBC and CMP updated on 01/26/24. Orders for CBC and CMP released today.  She will require updated lab work in 1 month then every 3 months.  Baseline immunosuppressive labs obtained on 01/11/24.   Orders for CBC and CMP released today.  - Plan: CBC with Differential/Platelet, Comprehensive metabolic panel with GFR  Pain in both hands: No synovitis currently.  Her hand pain and stiffness has improved significantly since initiating methotrexate .  She remains on prednisone  5 mg currently.  Bilateral knee effusions - 12/28/23: Culture-, synovial analysis-no crystals.  X-rays of both knees obtained on 11/30/2023.  RF positive, anti-CCP positive, ESR elevated, CRP elevated: Improving.  Patient currently rates the pain in both knees a 5 out of 10.  Her symptoms are exacerbated by walking prolonged distances especially in the halls at work.  She remains on prednisone  5 mg daily and is taking methotrexate  7 tablets by mouth once weekly.  The plan is to add on Enbrel as combination therapy due to severity of pain and inflammation affecting both knees.  She was in agreement.  Synovitis of left shoulder - Underwent open rotator cuff repair on 11/14/2023 by [name]. Improving.   Impingement syndrome of right shoulder - [name]: Good ROM of the right shoulder on examination today.    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 - Underwent arthroscopic repair on 11/14/23 by [name]. ROM is improving.   Rheumatoid factor positive - 12/28/23: RF 162, Anti-CCP >250, ESR 117, CRP 66.9, ANA negative, uric acid WNL:  Positive anti-CCP test - 12/28/23: RF 162, Anti-CCP >250, ESR 117, CRP 66.9, ANA negative, uric acid WNL  Elevated C-reactive protein (CRP): ESR and CRP updated today.    Elevated sed rate: ESR and CRP will be updated today.   Prediabetes  Orders: Orders Placed This Encounter  Procedures   CBC with Differential/Platelet   Comprehensive metabolic panel with GFR   Sedimentation rate   C-reactive protein   No  orders of the defined  types were placed in this encounter.    Follow-Up Instructions: Return in about 2 months (around 04/21/2024) for Rheumatoid arthritis.   Waddell CHRISTELLA Craze, PA-C  Note - This record has been created using Dragon software.  Chart creation errors have been sought, but may not always  have been located. Such creation errors do not reflect on  the standard of medical care.     [1]  Social History Tobacco Use   Smoking status: Never    Passive exposure: Never   Smokeless tobacco: Never  Vaping Use   Vaping status: Never Used  Substance Use Topics   Alcohol use: No   Drug use: No   "

## 2024-02-12 ENCOUNTER — Telehealth: Payer: Self-pay | Admitting: Rheumatology

## 2024-02-12 ENCOUNTER — Ambulatory Visit: Admitting: Orthopedic Surgery

## 2024-02-12 ENCOUNTER — Telehealth: Payer: Self-pay | Admitting: Physical Medicine and Rehabilitation

## 2024-02-12 ENCOUNTER — Encounter: Payer: Self-pay | Admitting: Orthopedic Surgery

## 2024-02-12 ENCOUNTER — Other Ambulatory Visit: Payer: Self-pay | Admitting: *Deleted

## 2024-02-12 DIAGNOSIS — Z79899 Other long term (current) drug therapy: Secondary | ICD-10-CM

## 2024-02-12 DIAGNOSIS — R899 Unspecified abnormal finding in specimens from other organs, systems and tissues: Secondary | ICD-10-CM | POA: Diagnosis not present

## 2024-02-12 DIAGNOSIS — M47816 Spondylosis without myelopathy or radiculopathy, lumbar region: Secondary | ICD-10-CM | POA: Diagnosis not present

## 2024-02-12 LAB — CBC WITH DIFFERENTIAL/PLATELET
Absolute Lymphocytes: 1668 {cells}/uL (ref 850–3900)
Absolute Monocytes: 509 {cells}/uL (ref 200–950)
Basophils Absolute: 20 {cells}/uL (ref 0–200)
Basophils Relative: 0.3 %
Eosinophils Absolute: 60 {cells}/uL (ref 15–500)
Eosinophils Relative: 0.9 %
HCT: 36.9 % (ref 35.9–46.0)
Hemoglobin: 11.5 g/dL — ABNORMAL LOW (ref 11.7–15.5)
MCH: 28 pg (ref 27.0–33.0)
MCHC: 31.2 g/dL — ABNORMAL LOW (ref 31.6–35.4)
MCV: 90 fL (ref 81.4–101.7)
MPV: 10.7 fL (ref 7.5–12.5)
Monocytes Relative: 7.6 %
Neutro Abs: 4442 {cells}/uL (ref 1500–7800)
Neutrophils Relative %: 66.3 %
Platelets: 262 Thousand/uL (ref 140–400)
RBC: 4.1 Million/uL (ref 3.80–5.10)
RDW: 15.1 % — ABNORMAL HIGH (ref 11.0–15.0)
Total Lymphocyte: 24.9 %
WBC: 6.7 Thousand/uL (ref 3.8–10.8)

## 2024-02-12 NOTE — Progress Notes (Signed)
 "  Office Visit Note   Patient: Mary Burton           Date of Birth: 05/26/1960           MRN: [id number] Visit Date: 02/12/2024 Requested by: Lendia Boby CROME, NP-C [address] [place],  [place] [address] PCP: Paseda, Folashade R, FNP  Subjective: Chief Complaint  Patient presents with   Right Knee - Pain   Left Knee - Pain    HPI: [name] is a 64 y.o. female who presents to the office reporting bilateral knee pain which actually has improved since she has started treatment for her rheumatoid arthritis.  She did see Dr. Eldonna who by mutual agreement did not inject her back at that time because she was relatively asymptomatic.  Patient did cancel her appointment with [name].  She has rheumatologic appointment on January 15.  Her back pain has improved significantly.  Currently she is taking prednisone  and she was on methotrexate  but she has run out.  Pain management referral is pending.  She is supposed to go back to work on the 15th.  She is concerned about getting points for an excuse absences..                ROS: All systems reviewed are negative as they relate to the chief complaint within the history of present illness.  Patient denies fevers or chills.  Assessment & Plan: Visit Diagnoses:  1. Spondylosis without myelopathy or radiculopathy, lumbar region   2. Elevated laboratory test result     Plan: Impression is bilateral knee pain with some arthritis on plain radiographs but today there is no effusion so the intervention has really helped.  She has pretty reasonable range of motion today as well.  Back is not hurting her as much.  Overall she looks good but she still does have pain from the residual arthritis in her knee.  I think she is going to need knee replacement at sometime in the future but for now she is getting settled in her medical management of her significant rheumatoid arthritis.  I will see her back as needed.  We did talk today at length  about FMLA.  That would likely be best addressed by either Orie Lendia or Waddell Craze.  My recommendation would be for 1/2-day out per week for 4 months until her medical situation is stabilized.  Follow-up with us  as needed.  Follow-Up Instructions: No follow-ups on file.   Orders:  No orders of the defined types were placed in this encounter.  No orders of the defined types were placed in this encounter.     Procedures: No procedures performed   Clinical Data: No additional findings.  Objective: Vital Signs: LMP 05/22/2011   Physical Exam:  Constitutional: Patient appears well-developed HEENT:  Head: Normocephalic Eyes:EOM are normal Neck: Normal range of motion Cardiovascular: Normal rate Pulmonary/chest: Effort normal Neurologic: Patient is alert Skin: Skin is warm Psychiatric: Patient has normal mood and affect  Ortho Exam: Ortho exam demonstrates range of motion of the right knee is 5-1 20 with no effusion range of motion left knee 0-1 20 with no effusion.  No groin pain on either side and no nerve root tension signs.  She has good ankle dorsiflexion plantarflexion quad and hamstring strength.  Overall much better physical exam on the lower extremities today than when I have seen her in the past last year  Specialty Comments:  MRI LUMBAR SPINE 12/27/2023  07:46:31 AM   TECHNIQUE: Multiplanar multisequence MRI of the lumbar spine was performed without the administration of intravenous contrast.   COMPARISON: Lumbar spine radiographs 06/29/2022.   CLINICAL HISTORY: Lumbar radiculopathy, fall, hip pain, chronic, articular cartilage evaluate.   FINDINGS:   BONES AND ALIGNMENT: Slight degenerative anterolisthesis is present at L3-L4. Straightening of the normal lumbar lordosis is stable. Normal vertebral body heights. Bone marrow signal is unremarkable.   SPINAL CORD: The conus medullaris terminates at L1.   SOFT TISSUES: No paraspinal mass.    L1-L2: Facet hypertrophy is present at L1-L2 without focal disc protrusion or stenosis.   L2-L3: Disc bulge and a moderate bilateral facet hypertrophy is present at L2-L3 without focal stenosis.   L3-L4: Uncovering of a broad-based disc protrusion and moderate bilateral facet hypertrophy results in mild subarticular narrowing bilaterally. Moderate foraminal stenosis is present bilaterally.   L4-L5: Chronic loss of disc height is present at L4-L5. Moderate facet hypertrophy is present bilaterally. Moderate right and mild left foraminal stenosis is present.   L5-S1: Mild facet hypertrophy is present bilaterally at L5-S1 without focal disc protrusion or stenosis.   IMPRESSION: 1. Disc bulge and moderate bilateral facet hypertrophy at L2-L3 resulting in mild subarticular narrowing bilaterally and moderate foraminal stenosis bilaterally. 2. Chronic loss of disc height at L4-5 with moderate facet hypertrophy bilaterally and moderate right and mild left foraminal stenosis. 3. Slight degenerative anterolisthesis at L3-4. 4. Mild facet hypertrophy bilaterally at L5-S1 without focal disc protrusion or stenosis.   Electronically signed by: Lonni Necessary MD 12/30/2023  Imaging: No results found.   PMFS History: Patient Active Problem List   Diagnosis Date Noted   Need for shingles vaccine 01/12/2024   Need for pneumococcal 20-valent conjugate vaccination 01/12/2024   Encounter for lipid screening for cardiovascular disease 01/12/2024   Rheumatoid arthritis involving multiple sites with positive rheumatoid factor (HCC) 01/12/2024   Chronic low back pain 01/12/2024   Health care maintenance 01/12/2024   Synovitis of left shoulder 12/02/2023   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 12/02/2023   Biceps tendonitis, left 12/02/2023   Complete tear of left rotator cuff 12/02/2023   Pain in right shoulder 10/05/2023   Idiopathic gout of  multiple sites 10/05/2023   Right flank pain 08/25/2023   Pain in left shoulder 08/18/2022   Acute cystitis with hematuria 10/06/2021   Prediabetes 10/06/2021   Obesity (BMI 30.0-34.9) 10/06/2021   Impingement syndrome of right shoulder 07/02/2021   Bilateral foot pain 04/02/2014   Bilateral swelling of feet 06/06/2011   Seasonal allergies 06/06/2011   Bilateral knee pain 06/06/2011   Past Medical History:  Diagnosis Date   Allergy    Arthritis    Bilateral knee pain 06/06/2011   Environmental allergies    Gout    r hand   Impingement syndrome of right shoulder 07/02/2021   Pre-diabetes    Prediabetes 10/06/2021   Rheumatic arteritis     Family History  Problem Relation Age of Onset   Colon cancer Mother        unsure age   Hypertension Mother    Hypertension Father    Kidney failure Father    Early death Sister    High blood pressure Sister    Healthy Brother    Healthy Brother    Healthy Brother    Healthy Daughter    Colon polyps Neg Hx    Esophageal cancer Neg Hx    Rectal cancer Neg Hx  Stomach cancer Neg Hx     Past Surgical History:  Procedure Laterality Date   BICEPT TENODESIS Left 11/14/2023   Procedure: TENODESIS, BICEPS;  Surgeon: Addie Cordella Hamilton, MD;  Location: [facility] OR;  Service: Orthopedics;  Laterality: Left;   COLONOSCOPY  10/12/2011   [place]   OVARIAN CYST REMOVAL     POLYPECTOMY     POSTERIOR LUMBAR FUSION 2 WITH HARDWARE REMOVAL Left 11/14/2023   Procedure: ARTHROSCOPY, SHOULDER WITH DEBRIDEMENT;  Surgeon: Addie Cordella Hamilton, MD;  Location: [facility] OR;  Service: Orthopedics;  Laterality: Left;   SHOULDER OPEN ROTATOR CUFF REPAIR Left 11/14/2023   Procedure: REPAIR, ROTATOR CUFF, OPEN;  Surgeon: Addie Cordella Hamilton, MD;  Location: [facility] OR;  Service: Orthopedics;  Laterality: Left;   TUBAL LIGATION  1985   Social History   Occupational History   Not on file  Tobacco Use   Smoking status: Never    Passive exposure: Never   Smokeless tobacco:  Never  Vaping Use   Vaping status: Never Used  Substance and Sexual Activity   Alcohol use: No   Drug use: No   Sexual activity: Yes    Birth control/protection: None        "

## 2024-02-12 NOTE — Telephone Encounter (Signed)
 Opened in error

## 2024-02-13 ENCOUNTER — Other Ambulatory Visit: Payer: Self-pay

## 2024-02-13 ENCOUNTER — Encounter (HOSPITAL_COMMUNITY): Payer: Self-pay | Admitting: Orthopedic Surgery

## 2024-02-13 ENCOUNTER — Ambulatory Visit: Admitting: Orthopedic Surgery

## 2024-02-13 ENCOUNTER — Ambulatory Visit: Payer: Self-pay | Admitting: Physician Assistant

## 2024-02-13 ENCOUNTER — Other Ambulatory Visit (HOSPITAL_COMMUNITY): Payer: Self-pay

## 2024-02-13 ENCOUNTER — Telehealth: Payer: Self-pay | Admitting: Orthopedic Surgery

## 2024-02-13 MED ORDER — METHOTREXATE SODIUM 2.5 MG PO TABS
17.5000 mg | ORAL_TABLET | ORAL | 0 refills | Status: AC
  Filled 2024-02-13: qty 84, 84d supply, fill #0

## 2024-02-13 NOTE — Addendum Note (Signed)
 Addended by: CENA ALFONSO CROME on: 02/13/2024 01:35 PM   Modules accepted: Orders

## 2024-02-13 NOTE — Telephone Encounter (Signed)
 They are managing her medically and they can fill out the forms.  My input was only a recommendation of what I thought it might be but since I am not really planning on seeing her anymore it does not make sense for Ortho to be doing the FMLA.  I did send them a note to that regard as well.  I also suggested to [place] that she drop the forms off with either Orie Blacker or Waddell Craze before the 15th.

## 2024-02-13 NOTE — Telephone Encounter (Signed)
 Received vm from patient. She wanted clarification on her RTW date. Is it 01/15?   She also stated she has forms.  [name] mentioned FMLA needs to be completed by PCP or Rheumatology and stated his recommendation for the leave of 1/2 day per week x 4 months.   Please clarify if the instructions of leave is for us  to complete or only the recommendations for the PCP or Rheumatology. Thank you so so much!

## 2024-02-13 NOTE — Addendum Note (Signed)
 Addended by: CENA ALFONSO CROME on: 02/13/2024 01:05 PM   Modules accepted: Orders

## 2024-02-13 NOTE — Telephone Encounter (Signed)
 A 4-week supply of prednisone  was sent to the pharmacy on 01/24/2024-please clarify if she has already completed this?

## 2024-02-13 NOTE — Progress Notes (Signed)
 Hemoglobin remains borderline low but has improved.  MCHC remains borderline low but has improved.  Recommend taking a multivitamin with iron. Okay to increase methotrexate  to 7 tablets by mouth once weekly and recheck blood work in 2 weeks.

## 2024-02-13 NOTE — Telephone Encounter (Signed)
 IC patient advised that any forms will need to be completed by her PCP or Rheumatology per [name]. Patient was frustrated with this information but expressed understanding.

## 2024-02-13 NOTE — Telephone Encounter (Signed)
 Spoke with patient and she states she has not completed the prednisone . Patient states the prednisone  is not helping. Patient states she is taking tylenol  with it and that is not even helping. Patient states she has missed two doses of MTX because she needs a refill on MTX. Prescription pended for MTX to be sent to the pharmacy.

## 2024-02-13 NOTE — Telephone Encounter (Signed)
 Patient called the office about knee pain an states Dr released patient to return to work on 02/12/2024. Patient is asking for a call back from Waddell Craze or the MA as she states she needs more steroids and the referring Dr refused to give her any, told her to call Waddell. Patient has an appointment on 02/22/2024.  I attempted to contacted patient, [facility] for patient to call the office back to see what she is needing the office to do.

## 2024-02-16 ENCOUNTER — Encounter: Payer: Self-pay | Admitting: Orthopedic Surgery

## 2024-02-16 ENCOUNTER — Telehealth: Payer: Self-pay | Admitting: Orthopedic Surgery

## 2024-02-16 NOTE — Telephone Encounter (Signed)
 Received voicemail from patient. She states she needs a note for work stating to RTW 02/22/24.

## 2024-02-16 NOTE — Telephone Encounter (Signed)
 Done. Patient should be able to access via mychart.

## 2024-02-19 ENCOUNTER — Telehealth: Payer: Self-pay

## 2024-02-19 NOTE — Telephone Encounter (Signed)
" °  Patient contacted office stating she needs paperwork to be released for work.  I do apologize but unfortunately his office will have to handle that as he was the one who took you out of work. We can not sign paperwork to release you back to work when we did not take you out of work.   Patient stated she understood but was not getting anywhere with her doctor.   "

## 2024-02-19 NOTE — Telephone Encounter (Signed)
 No overhead lifting left arm   No lifting from waist to shoulder more than 5 pounds.  No lifting floor to waist more than 10 pounds for the first 6 months of her return

## 2024-02-20 ENCOUNTER — Telehealth: Payer: Self-pay | Admitting: Orthopedic Surgery

## 2024-02-20 NOTE — Telephone Encounter (Signed)
 [name] I have updated this note

## 2024-02-20 NOTE — Telephone Encounter (Signed)
 Pt called saying that you wrote he a note and [employer] received it, but it doesn't have any work restrictions on it and it needs to for her to be able to go back to work. Call back number is [phone].

## 2024-02-20 NOTE — Telephone Encounter (Signed)
 From the perspective of the shoulder she should be regular duty February 28.  The restrictions that I put on the earlier note should be in effect until then just to prevent her from retearing her rotator cuff.  I have not operated on her knees and so from that perspective I am not going to give her any restrictions I am only talking about the shoulder on the left-hand side at this point.  Thanks

## 2024-02-20 NOTE — Telephone Encounter (Signed)
 Hey tammy I just updated the work note with restrictions

## 2024-02-22 ENCOUNTER — Telehealth: Payer: Self-pay

## 2024-02-22 ENCOUNTER — Encounter: Payer: Self-pay | Admitting: Physician Assistant

## 2024-02-22 ENCOUNTER — Ambulatory Visit: Attending: Physician Assistant | Admitting: Physician Assistant

## 2024-02-22 VITALS — BP 115/79 | HR 109 | Temp 98.2°F | Resp 14 | Ht 70.0 in | Wt 211.8 lb

## 2024-02-22 DIAGNOSIS — M79641 Pain in right hand: Secondary | ICD-10-CM | POA: Diagnosis not present

## 2024-02-22 DIAGNOSIS — R7982 Elevated C-reactive protein (CRP): Secondary | ICD-10-CM | POA: Diagnosis not present

## 2024-02-22 DIAGNOSIS — M7541 Impingement syndrome of right shoulder: Secondary | ICD-10-CM

## 2024-02-22 DIAGNOSIS — M0579 Rheumatoid arthritis with rheumatoid factor of multiple sites without organ or systems involvement: Secondary | ICD-10-CM

## 2024-02-22 DIAGNOSIS — Z79899 Other long term (current) drug therapy: Secondary | ICD-10-CM | POA: Diagnosis not present

## 2024-02-22 DIAGNOSIS — M25461 Effusion, right knee: Secondary | ICD-10-CM

## 2024-02-22 DIAGNOSIS — R700 Elevated erythrocyte sedimentation rate: Secondary | ICD-10-CM | POA: Diagnosis not present

## 2024-02-22 DIAGNOSIS — R7303 Prediabetes: Secondary | ICD-10-CM | POA: Diagnosis not present

## 2024-02-22 DIAGNOSIS — S43432A Superior glenoid labrum lesion of left shoulder, initial encounter: Secondary | ICD-10-CM | POA: Diagnosis not present

## 2024-02-22 DIAGNOSIS — R7681 Abnormal rheumatoid factor and anti-citrullinated protein antibody without rheumatoid arthritis: Secondary | ICD-10-CM

## 2024-02-22 DIAGNOSIS — R7689 Other specified abnormal immunological findings in serum: Secondary | ICD-10-CM

## 2024-02-22 DIAGNOSIS — M79642 Pain in left hand: Secondary | ICD-10-CM

## 2024-02-22 DIAGNOSIS — M25462 Effusion, left knee: Secondary | ICD-10-CM

## 2024-02-22 DIAGNOSIS — M65912 Unspecified synovitis and tenosynovitis, left shoulder: Secondary | ICD-10-CM | POA: Diagnosis not present

## 2024-02-22 NOTE — Telephone Encounter (Signed)
 Patient is Enbrel new start  Submitted a Prior Authorization request to [facility] for ENBREL via CoverMyMeds. Will update once we receive a response.  Key: [id number]

## 2024-02-22 NOTE — Patient Instructions (Signed)
 Standing Labs We placed an order today for your standing lab work.   Please have your standing labs drawn in 1 month then every 3 months.  Please have your labs drawn 2 weeks prior to your appointment so that the provider can discuss your lab results at your appointment, if possible.  Please note that you may see your imaging and lab results in MyChart before we have reviewed them. We will contact you once all results are reviewed. Please allow our office up to 72 hours to thoroughly review all of the results before contacting the office for clarification of your results.  WALK-IN LAB HOURS  Monday through Thursday from 8:00 am - 4:30 pm and Friday from 8:00 am-12:00 pm.  Patients with office visits requiring labs will be seen before walk-in labs.  You may encounter longer than normal wait times. Please allow additional time. Wait times may be shorter on  Monday and Thursday afternoons.  We do not book appointments for walk-in labs. We appreciate your patience and understanding with our staff.   Labs are drawn by [facility]. Please bring your co-pay at the time of your lab draw.  You may receive a bill from [facility] for your lab work.  Please note if you are on Hydroxychloroquine and and an order has been placed for a Hydroxychloroquine level,  you will need to have it drawn 4 hours or more after your last dose.  If you wish to have your labs drawn at another location, please call the office 24 hours in advance so we can fax the orders.  The office is located at [address], [place], [place] [address]   If you have any questions regarding directions or hours of operation,  please call [phone].   As a reminder, please drink plenty of water prior to coming for your lab work. Thanks!                Etanercept Injection What is this medication? ETANERCEPT (et a [employer]) treats autoimmune conditions, such as psoriasis and certain types of arthritis. It works by  slowing down an overactive immune system. It belongs to a group of medications called TNF inhibitors. This medicine may be used for other purposes; ask your health care provider or pharmacist if you have questions. COMMON BRAND NAME(S): Enbrel What should I tell my care team before I take this medication? They need to know if you have any of these conditions: Bleeding disorder Cancer Diabetes Granulomatosis with polyangiitis Heart failure HIV or AIDS Immune system problems Infection, such as tuberculosis (TB) or other bacterial, fungal or viral infections Liver disease Nervous system problems, such as Guillain-Barre syndrome, multiple sclerosis or seizures Recent or upcoming vaccine An unusual or allergic reaction to etanercept, other medications, food, dyes, or preservatives Pregnant or trying to get pregnant Breastfeeding How should I use this medication? The medication is injected under the skin. You will be taught how to prepare and give it. Take it as directed on the prescription label. Keep taking it unless your care team tells you stop. This medication comes with INSTRUCTIONS FOR USE. Ask your pharmacist for directions on how to use this medication. Read the information carefully. Talk to your pharmacist or care team if you have questions. If you use a pen, be sure to take off the outer needle cover before using the dose. It is important that you put your used needles and syringes in a special sharps container. Do not put them in  a trash can. If you do not have a sharps container, call your pharmacist or care team to get one. A special MedGuide will be given to you by the pharmacist with each prescription and refill. Be sure to read this information carefully each time. Talk to your care team about the use of this medication in children. While it may be prescribed for children as young as 71 years of age for selected conditions, precautions do apply. Overdosage: If you think you have  taken too much of this medicine contact a poison control center or emergency room at once. NOTE: This medicine is only for you. Do not share this medicine with others. What if I miss a dose? If you miss a dose, take it as soon as you can. If it is almost time for your next dose, take only that dose. Do not take double or extra doses. What may interact with this medication? Do not take this medication with any of the following: Biologic medications, such as adalimumab, certolizumab, golimumab, infliximab Live vaccines Rilonacept This medication may also interact with the following: Abatacept [place] Biologic medications, such as anifrolumab, baricitinib, belimumab, canakinumab, natalizumab, rituximab, sarilumab, tocilizumab, tofacitinib, upadacitinib, vedolizumab Cyclophosphamide Sulfasalazine This list may not describe all possible interactions. Give your health care provider a list of all the medicines, herbs, non-prescription drugs, or dietary supplements you use. Also tell them if you smoke, drink alcohol, or use illegal drugs. Some items may interact with your medicine. What should I watch for while using this medication? Visit your care team for regular checks on your progress. Tell your care team if your symptoms do not start to get better or if they get worse. This medication may increase your risk of getting an infection. Call your care team for advice if you get a fever, chills, sore throat, or other symptoms of a cold or flu. Do not treat yourself. Try to avoid being around people who are sick. If you have not had the measles or chickenpox vaccines, tell your care team right away if you are around someone with these viruses. You will be tested for tuberculosis (TB) before you start this medication. If your care team prescribes any medication for TB, you should start taking the TB medication before starting this medication. Make sure to finish the full course of TB medication. Avoid  taking medications that contain aspirin, acetaminophen , ibuprofen, naproxen , or ketoprofen unless instructed by your care team. These medications may hide fever. Talk to your care team about your risk of cancer. You may be more at risk for certain types of cancer if you take this medication. This medication can decrease the response to a vaccine. If you need to get vaccinated, tell your care team if you have received this medication. Extra booster doses may be needed. Talk to your care team to see if a different vaccination schedule is needed. What side effects may I notice from receiving this medication? Side effects that you should report to your care team as soon as possible: Allergic reactions--skin rash, itching, hives, swelling of the face, lips, tongue, or throat Body pain, tingling, or numbness Eye pain, change in vision, vision loss Heart failure--shortness of breath, swelling of the ankles, feet, or hands, sudden weight gain, unusual weakness or fatigue Infection--fever, chills, cough, sore throat, wounds that don't heal, pain or trouble when passing urine, general feeling of discomfort or being unwell Liver injury--right upper belly pain, loss of appetite, nausea, light-colored stool, dark yellow or brown urine,  yellowing skin or eyes, unusual weakness or fatigue Low red blood cell level--unusual weakness or fatigue, dizziness, headache, trouble breathing Lupus-like syndrome--joint pain, swelling, or stiffness, butterfly-shaped rash on the face, rashes that get worse in the sun, fever, unusual weakness or fatigue New or worsening psoriasis--rash with itchy, scaly patches Seizures Unusual bruising or bleeding Weakness in arms and legs Side effects that usually do not require medical attention (report to your care team if they continue or are bothersome): Headache Pain, redness, or irritation at injection site Sinus pain or pressure around the face or forehead This list may not describe  all possible side effects. Call your doctor for medical advice about side effects. You may report side effects to FDA at 1-800-FDA-1088. Where should I keep my medication? Keep out of the reach of children and pets. See product for storage information. Each product may have different instructions. Get rid of any unused medication after the expiration date. To get rid of medications that are no longer needed or have expired: Take the medication to a medication take-back program. Check with your pharmacy or law enforcement to find a location. If you cannot return the medication, ask your pharmacist or care team how to get rid of this medication safely. NOTE: This sheet is a summary. It may not cover all possible information. If you have questions about this medicine, talk to your doctor, pharmacist, or health care provider.  2024 Elsevier/Gold Standard (2021-07-21 00:00:00)

## 2024-02-23 ENCOUNTER — Ambulatory Visit: Payer: Self-pay | Admitting: Rheumatology

## 2024-02-23 LAB — COMPREHENSIVE METABOLIC PANEL WITH GFR
AG Ratio: 1.2 (calc) (ref 1.0–2.5)
ALT: 16 U/L (ref 6–29)
AST: 10 U/L (ref 10–35)
Albumin: 4 g/dL (ref 3.6–5.1)
Alkaline phosphatase (APISO): 52 U/L (ref 37–153)
BUN/Creatinine Ratio: 31 (calc) — ABNORMAL HIGH (ref 6–22)
BUN: 15 mg/dL (ref 7–25)
CO2: 28 mmol/L (ref 20–32)
Calcium: 9.5 mg/dL (ref 8.6–10.4)
Chloride: 102 mmol/L (ref 98–110)
Creat: 0.49 mg/dL — ABNORMAL LOW (ref 0.50–1.05)
Globulin: 3.4 g/dL (ref 1.9–3.7)
Glucose, Bld: 81 mg/dL (ref 65–99)
Potassium: 4.2 mmol/L (ref 3.5–5.3)
Sodium: 138 mmol/L (ref 135–146)
Total Bilirubin: 0.6 mg/dL (ref 0.2–1.2)
Total Protein: 7.4 g/dL (ref 6.1–8.1)
eGFR: 105 mL/min/1.73m2

## 2024-02-23 LAB — C-REACTIVE PROTEIN: CRP: 66.4 mg/L — ABNORMAL HIGH

## 2024-02-23 LAB — CBC WITH DIFFERENTIAL/PLATELET
Absolute Lymphocytes: 1451 {cells}/uL (ref 850–3900)
Absolute Monocytes: 459 {cells}/uL (ref 200–950)
Basophils Absolute: 19 {cells}/uL (ref 0–200)
Basophils Relative: 0.3 %
Eosinophils Absolute: 37 {cells}/uL (ref 15–500)
Eosinophils Relative: 0.6 %
HCT: 36.9 % (ref 35.9–46.0)
Hemoglobin: 11.8 g/dL (ref 11.7–15.5)
MCH: 28.3 pg (ref 27.0–33.0)
MCHC: 32 g/dL (ref 31.6–35.4)
MCV: 88.5 fL (ref 81.4–101.7)
MPV: 11.1 fL (ref 7.5–12.5)
Monocytes Relative: 7.4 %
Neutro Abs: 4235 {cells}/uL (ref 1500–7800)
Neutrophils Relative %: 68.3 %
Platelets: 273 Thousand/uL (ref 140–400)
RBC: 4.17 Million/uL (ref 3.80–5.10)
RDW: 15.2 % — ABNORMAL HIGH (ref 11.0–15.0)
Total Lymphocyte: 23.4 %
WBC: 6.2 Thousand/uL (ref 3.8–10.8)

## 2024-02-23 LAB — SEDIMENTATION RATE: Sed Rate: 79 mm/h — ABNORMAL HIGH (ref 0–30)

## 2024-02-23 NOTE — Progress Notes (Signed)
 CBC and CMP are stable.  CRP (inflammatory marker) remains elevated.  Sed rate is pending.

## 2024-02-25 NOTE — Progress Notes (Signed)
 Sed rate is elevated but improved.

## 2024-02-26 ENCOUNTER — Other Ambulatory Visit: Payer: Self-pay

## 2024-02-26 ENCOUNTER — Other Ambulatory Visit (HOSPITAL_COMMUNITY): Payer: Self-pay

## 2024-02-26 MED ORDER — PREDNISONE 5 MG PO TABS
10.0000 mg | ORAL_TABLET | Freq: Every day | ORAL | 1 refills | Status: AC
  Filled 2024-02-26: qty 60, 30d supply, fill #0

## 2024-02-26 NOTE — Addendum Note (Signed)
 Addended by: YVONE DAVED BROCKS on: 02/26/2024 12:46 PM   Modules accepted: Orders

## 2024-02-26 NOTE — Telephone Encounter (Signed)
 Please review and sign pended prednisone rx. Thanks!

## 2024-02-26 NOTE — Telephone Encounter (Signed)
 [employer] has not yet responded to PA request at this time.

## 2024-02-26 NOTE — Telephone Encounter (Signed)
 I returned patient's call.  Patient is awaiting approval for Enbrel.  She states she is down to prednisone  5 mg p.o. daily.  She has been having severe pain and swelling in multiple joints and is unable to function.  We have not heard back from prior authorization for Enbrel.  Pharmacy team is aware.  I advised patient that we will send a prescription for prednisone  5 mg tablet, 2 tablets every morning until she starts on Enbrel.  Patient voiced understanding.  Please send a prescription for prednisone  5 mg tablet, 2 tablets every morning total 1 month supply with 1 refill.

## 2024-02-26 NOTE — Telephone Encounter (Signed)
 Patient contacted office stating that the methotrexate  does not last the whole week Patient would like some advise on what to do. Patient is having difficulty walking form the pain.

## 2024-03-01 ENCOUNTER — Telehealth: Payer: Self-pay

## 2024-03-01 NOTE — Telephone Encounter (Signed)
 Patient called the office stating that the prednisone  and the methotrexate  are not helping with her knee pain. Patient mentioned gel injections being sent over for insurance and said she has not heard anything. She does not want to have to go back out of work and would like to know what can she do to help with the pain. Please advise.

## 2024-03-03 MED ORDER — ENBREL SURECLICK 50 MG/ML ~~LOC~~ SOAJ
50.0000 mg | SUBCUTANEOUS | 0 refills | Status: AC
  Filled 2024-03-12: qty 4, 28d supply, fill #0

## 2024-03-03 NOTE — Telephone Encounter (Signed)
 Received notification from [facility] regarding a prior authorization for ENBREL . Authorization has been APPROVED from 02/27/2024 to 08/25/2024. Approval letter sent to scan center.  Patient must fill through [facility]: [phone]   Authorization # [id number]  Enrolled patient into Enbrel  copay card: BIN: [id number] PCN: [id number] Group: [id number] ID: [id number]  Rx sent to [facility]. MyChart message is scheduled for tomorrow.  Sherry Pennant, PharmD, MPH, BCPS, CPP Clinical Pharmacist

## 2024-03-04 NOTE — Telephone Encounter (Signed)
 Currently in the process of applying for enbrel .   We did not start application for visco--this may have been ordered by her orthopedist.  Recommend controlling the inflammation prior to pursuing visco.   We can send in another prednisone  taper until enbrel  has been initiated if she would like to try that?

## 2024-03-05 ENCOUNTER — Other Ambulatory Visit: Payer: Self-pay

## 2024-03-05 ENCOUNTER — Other Ambulatory Visit (HOSPITAL_COMMUNITY): Payer: Self-pay

## 2024-03-05 NOTE — Telephone Encounter (Signed)
 Please contact patient regarding shipment. Patient would like to pick up medication at [facility] as she works across the street. Please call patient.

## 2024-03-05 NOTE — Telephone Encounter (Signed)
 This is noted - patient will set up shipment based on patient preference  Sherry Pennant, PharmD, MPH, BCPS, CPP Clinical Pharmacist

## 2024-03-05 NOTE — Telephone Encounter (Signed)
 Advised patient that Currently in the process of applying for enbrel .   We did not start application for visco--this may have been ordered by her orthopedist.  Recommend controlling the inflammation prior to pursuing visco.  Discussed difference between enbrel  and visco. Patient declined prednisone  taper. Also advised patient that [facility] sent patient a mychart message yesterday regarding enbrel  instructions. Patient would like to pick up medication at [facility]. I have sent the pharmacy team a message.

## 2024-03-07 ENCOUNTER — Encounter: Payer: Self-pay | Admitting: Physical Medicine & Rehabilitation

## 2024-03-12 ENCOUNTER — Other Ambulatory Visit: Payer: Self-pay

## 2024-03-12 ENCOUNTER — Other Ambulatory Visit (HOSPITAL_COMMUNITY): Payer: Self-pay

## 2024-03-12 NOTE — Progress Notes (Signed)
 Specialty Pharmacy Initial Fill Coordination Note  Mary Burton is a 64 y.o. female contacted today regarding initial fill of specialty medication(s) Etanercept  (Enbrel  SureClick)   Patient requested [name] at [facility] at [place] date: 03/14/24   Medication will be filled on: 03/13/24    Patient is enrolled into copay card program and is aware of $0 copayment.

## 2024-03-13 ENCOUNTER — Other Ambulatory Visit: Payer: Self-pay

## 2024-03-14 ENCOUNTER — Telehealth: Payer: Self-pay

## 2024-03-14 ENCOUNTER — Other Ambulatory Visit: Payer: Self-pay

## 2024-03-14 NOTE — Telephone Encounter (Signed)
 Patient called the office to check an see when she needed to have labs drawn again since she is on methotrexate . Patient advised that labs are due every 3 months and since we had labs 02/22/2024 that she would not need labs again until 04/21/2024 which she has an appointment on 04/25/2024. Patient verbalized understanding

## 2024-03-15 ENCOUNTER — Encounter: Payer: Self-pay | Admitting: Pharmacist

## 2024-03-15 NOTE — Progress Notes (Signed)
 Counseled patient that Enbrel  is a TNF blocking agent.  Counseled patient on purpose, proper use, and adverse effects of Enbrel .  Reviewed the most common adverse effects including infections, headache, and injection site reactions. counseled patient that Enbrel  should be held prior to scheduled surgery.    Reviewed the importance of regular labs while on Enbrel  therapy.  Will monitor CBC and CMP 1 month after starting and then every 3 months routinely thereafter. Will monitor TB gold annually.   Reviewed storage instructions for Enbrel .      She is starting PT soon.  Reporting swelling in her ankle with red rash. Stating it looks like it may be starting in her other foot. Denies itchiness. Message sent to triage. She is still taking prednisone   She took her first Enbrel  this morning in abdomen. NO hives, itchiness, issues at injection site.  Sherry Pennant, PharmD, MPH, BCPS, CPP Clinical Pharmacist

## 2024-03-25 ENCOUNTER — Encounter: Admitting: Physical Medicine & Rehabilitation

## 2024-04-22 ENCOUNTER — Ambulatory Visit: Admitting: Physician Assistant

## 2024-04-25 ENCOUNTER — Ambulatory Visit: Admitting: Physician Assistant

## 2024-07-12 ENCOUNTER — Ambulatory Visit: Payer: Self-pay

## 2024-10-16 ENCOUNTER — Encounter: Payer: Self-pay | Admitting: Nurse Practitioner
